# Patient Record
Sex: Female | Born: 1950 | Race: White | Hispanic: No | Marital: Married | Smoking: Former smoker
Health system: Southern US, Community
[De-identification: ages and names within clinical notes are randomized; demographics above are authoritative.]

## PROBLEM LIST (undated history)

## (undated) DIAGNOSIS — IMO0002 Reserved for concepts with insufficient information to code with codable children: Secondary | ICD-10-CM

## (undated) DIAGNOSIS — F32A Depression, unspecified: Secondary | ICD-10-CM

## (undated) DIAGNOSIS — Z8489 Family history of other specified conditions: Secondary | ICD-10-CM

## (undated) DIAGNOSIS — E7800 Pure hypercholesterolemia, unspecified: Secondary | ICD-10-CM

## (undated) DIAGNOSIS — K219 Gastro-esophageal reflux disease without esophagitis: Secondary | ICD-10-CM

## (undated) DIAGNOSIS — M1990 Unspecified osteoarthritis, unspecified site: Secondary | ICD-10-CM

## (undated) DIAGNOSIS — F329 Major depressive disorder, single episode, unspecified: Secondary | ICD-10-CM

## (undated) DIAGNOSIS — R112 Nausea with vomiting, unspecified: Secondary | ICD-10-CM

## (undated) DIAGNOSIS — M810 Age-related osteoporosis without current pathological fracture: Secondary | ICD-10-CM

## (undated) DIAGNOSIS — Z9889 Other specified postprocedural states: Secondary | ICD-10-CM

## (undated) DIAGNOSIS — L570 Actinic keratosis: Secondary | ICD-10-CM

## (undated) DIAGNOSIS — I10 Essential (primary) hypertension: Secondary | ICD-10-CM

## (undated) HISTORY — PX: ABDOMINAL HYSTERECTOMY: SHX81

## (undated) HISTORY — DX: Actinic keratosis: L57.0

## (undated) HISTORY — PX: TONSILLECTOMY: SUR1361

## (undated) HISTORY — PX: CHOLECYSTECTOMY: SHX55

---

## 2005-03-27 ENCOUNTER — Ambulatory Visit: Payer: Self-pay | Admitting: Unknown Physician Specialty

## 2005-09-18 ENCOUNTER — Encounter: Payer: Self-pay | Admitting: Neurosurgery

## 2005-10-06 ENCOUNTER — Encounter: Payer: Self-pay | Admitting: Neurosurgery

## 2005-11-12 ENCOUNTER — Other Ambulatory Visit: Payer: Self-pay

## 2005-11-12 ENCOUNTER — Emergency Department: Payer: Self-pay | Admitting: Emergency Medicine

## 2006-10-06 HISTORY — PX: NECK SURGERY: SHX720

## 2006-12-28 ENCOUNTER — Ambulatory Visit: Payer: Self-pay | Admitting: Unknown Physician Specialty

## 2007-01-04 ENCOUNTER — Inpatient Hospital Stay (HOSPITAL_COMMUNITY): Admission: RE | Admit: 2007-01-04 | Discharge: 2007-01-06 | Payer: Self-pay | Admitting: Neurosurgery

## 2007-03-17 ENCOUNTER — Encounter: Payer: Self-pay | Admitting: Neurosurgery

## 2007-04-06 ENCOUNTER — Encounter: Payer: Self-pay | Admitting: Neurosurgery

## 2007-05-07 ENCOUNTER — Encounter: Payer: Self-pay | Admitting: Neurosurgery

## 2007-06-07 ENCOUNTER — Encounter: Payer: Self-pay | Admitting: Neurosurgery

## 2007-09-27 ENCOUNTER — Ambulatory Visit: Payer: Self-pay | Admitting: Otolaryngology

## 2007-12-07 DIAGNOSIS — D239 Other benign neoplasm of skin, unspecified: Secondary | ICD-10-CM

## 2007-12-07 HISTORY — DX: Other benign neoplasm of skin, unspecified: D23.9

## 2008-03-10 ENCOUNTER — Ambulatory Visit: Payer: Self-pay | Admitting: Internal Medicine

## 2008-07-21 ENCOUNTER — Ambulatory Visit: Payer: Self-pay | Admitting: Internal Medicine

## 2008-12-04 ENCOUNTER — Ambulatory Visit: Payer: Self-pay | Admitting: Internal Medicine

## 2009-01-04 ENCOUNTER — Ambulatory Visit: Payer: Self-pay | Admitting: Internal Medicine

## 2009-01-17 ENCOUNTER — Ambulatory Visit: Payer: Self-pay | Admitting: Internal Medicine

## 2009-02-03 ENCOUNTER — Ambulatory Visit: Payer: Self-pay | Admitting: Internal Medicine

## 2009-05-14 ENCOUNTER — Ambulatory Visit: Payer: Self-pay | Admitting: Unknown Physician Specialty

## 2009-05-14 ENCOUNTER — Ambulatory Visit: Payer: Self-pay | Admitting: Cardiology

## 2009-05-17 ENCOUNTER — Ambulatory Visit: Payer: Self-pay | Admitting: Unknown Physician Specialty

## 2012-08-05 ENCOUNTER — Ambulatory Visit: Payer: Self-pay | Admitting: Family Medicine

## 2012-10-06 HISTORY — PX: WRIST SURGERY: SHX841

## 2013-11-09 ENCOUNTER — Ambulatory Visit: Payer: Self-pay | Admitting: Family Medicine

## 2014-08-03 DIAGNOSIS — M961 Postlaminectomy syndrome, not elsewhere classified: Secondary | ICD-10-CM | POA: Insufficient documentation

## 2014-08-17 ENCOUNTER — Ambulatory Visit: Payer: Self-pay | Admitting: Medical

## 2014-12-06 ENCOUNTER — Ambulatory Visit: Payer: Self-pay | Admitting: Family Medicine

## 2016-01-07 ENCOUNTER — Other Ambulatory Visit: Payer: Self-pay | Admitting: Family Medicine

## 2016-01-07 DIAGNOSIS — Z7185 Encounter for immunization safety counseling: Secondary | ICD-10-CM | POA: Insufficient documentation

## 2016-01-07 DIAGNOSIS — Z1231 Encounter for screening mammogram for malignant neoplasm of breast: Secondary | ICD-10-CM

## 2016-01-07 DIAGNOSIS — Z7189 Other specified counseling: Secondary | ICD-10-CM | POA: Insufficient documentation

## 2016-01-17 ENCOUNTER — Ambulatory Visit
Admission: RE | Admit: 2016-01-17 | Discharge: 2016-01-17 | Disposition: A | Discharge status: Home / Self Care | Payer: BLUE CROSS/BLUE SHIELD | Source: Ambulatory Visit | Attending: Family Medicine | Admitting: Family Medicine

## 2016-01-17 DIAGNOSIS — Z1231 Encounter for screening mammogram for malignant neoplasm of breast: Secondary | ICD-10-CM | POA: Diagnosis present

## 2016-11-05 ENCOUNTER — Emergency Department
Admission: EM | Admit: 2016-11-05 | Discharge: 2016-11-05 | Disposition: A | Discharge status: Home / Self Care | Payer: BLUE CROSS/BLUE SHIELD | Attending: Emergency Medicine | Admitting: Emergency Medicine

## 2016-11-05 ENCOUNTER — Emergency Department: Payer: BLUE CROSS/BLUE SHIELD

## 2016-11-05 ENCOUNTER — Encounter: Payer: Self-pay | Admitting: *Deleted

## 2016-11-05 DIAGNOSIS — I10 Essential (primary) hypertension: Secondary | ICD-10-CM | POA: Insufficient documentation

## 2016-11-05 DIAGNOSIS — J189 Pneumonia, unspecified organism: Secondary | ICD-10-CM | POA: Insufficient documentation

## 2016-11-05 DIAGNOSIS — R05 Cough: Secondary | ICD-10-CM | POA: Diagnosis present

## 2016-11-05 HISTORY — DX: Essential (primary) hypertension: I10

## 2016-11-05 MED ORDER — MOXIFLOXACIN HCL 400 MG PO TABS: 400.0000 mg | ORAL_TABLET | Freq: Every day | ORAL | 0 refills | Status: AC

## 2016-11-05 MED ORDER — HYDROCOD POLST-CPM POLST ER 10-8 MG/5ML PO SUER: 5.0000 mL | Freq: Two times a day (BID) | ORAL | 0 refills | Status: DC | PRN

## 2016-11-05 MED ORDER — PREDNISONE 10 MG PO TABS: 50.0000 mg | ORAL_TABLET | Freq: Every day | ORAL | 0 refills | Status: DC

## 2016-11-05 NOTE — Discharge Instructions (Signed)
Please follow up with Primary Care. You should have a repeat chest x-ray in about a month to make sure the infection has totally cleared.  Return to the ER for symptoms that change or worsen or for new concerns.

## 2016-11-05 NOTE — ED Provider Notes (Signed)
[facility] Emergency Department Provider Note  ____________________________________________  Time seen: Approximately 8:36 AM I have reviewed the triage vital signs and the nursing notes.   HISTORY  Chief Complaint Cough   HPI Jane Riley is a 66 y.o. female who presents to the emergency department for evaluation of cough.Cough started about 6 weeks ago. She took antibiotics at that time and it seemed to go away. Her husband then got the flu and she began to feel bad and her cough returned. She had a 10 day course of Levaquin in December. No antibiotic since that time. She now has pain in her left side due to the cough.   Past Medical History:  Diagnosis Date  . Hypertension     There are no active problems to display for this patient.   History reviewed. No pertinent surgical history.  Prior to Admission medications   Medication Sig Start Date End Date Taking? Authorizing Provider  chlorpheniramine-HYDROcodone (TUSSIONEX PENNKINETIC ER) 10-8 MG/5ML SUER Take 5 mLs by mouth every 12 (twelve) hours as needed for cough. 11/05/16   Victorino Dike, FNP  moxifloxacin (AVELOX) 400 MG tablet Take 1 tablet (400 mg total) by mouth daily. 11/05/16 11/15/16  Victorino Dike, FNP  predniSONE (DELTASONE) 10 MG tablet Take 5 tablets (50 mg total) by mouth daily. 11/05/16   Victorino Dike, FNP    Allergies Codeine and Penicillins  Family History  Problem Relation Age of Onset  . Breast cancer Cousin     Social History Social History  Substance Use Topics  . Smoking status: Never Smoker  . Smokeless tobacco: Not on file  . Alcohol use Not on file    Review of Systems Constitutional: Questionable fever 2 nights ago ENT: No sore throat. Cardiovascular: Denies chest pain. Respiratory: No shortness of breath. Positive for cough. Gastrointestinal: Negative for nausea,  no vomiting.  No diarrhea.  Musculoskeletal: Negative for body aches Skin: Negative for  rash. Neurological: Negative for headaches ____________________________________________   PHYSICAL EXAM:  VITAL SIGNS: ED Triage Vitals [11/05/16 0831]  Enc Vitals Group     BP (!) 167/77     Pulse Rate (!) 102     Resp 18     Temp 98.3 F (36.8 C)     Temp Source Oral     SpO2 96 %     Weight 150 lb (68 kg)     Height 5\' 4"  (1.626 m)     Head Circumference      Peak Flow      Pain Score 10     Pain Loc      Pain Edu?      Excl. in [place]?     Constitutional: Alert and oriented. Well appearing and in no acute distress. Eyes: Conjunctivae are normal. EOMI. Mouth/Throat: Mucous membranes are moist. Neck: No stridor.  Lymphatic: No cervical lymphadenopathy. Cardiovascular: Normal rate, regular rhythm. Grossly normal heart sounds.  Good peripheral circulation. Respiratory: Normal respiratory effort.  No retractions. Clear to auscultation throughout. Gastrointestinal: Soft and nontender.  Musculoskeletal: FROM x 4 extremities.  Neurologic:  Normal speech and language.  Skin:  Skin is warm, dry and intact. No rash noted. Psychiatric: Mood and affect are normal. Speech and behavior are normal.  ____________________________________________   LABS (all labs ordered are listed, but only abnormal results are displayed)  Labs Reviewed - No data to display ____________________________________________  EKG  Not indicated. ____________________________________________  RADIOLOGY Chest 2 view:  IMPRESSION:  Mild  infiltrate is noted anteriorly projected over the mid lungs,  seen on lateral view only. Mild pneumonia cannot be excluded .  Follow-up PA lateral chest x-ray suggested to demonstrate clearing.      ____________________________________________   PROCEDURES  Procedure(s) performed: None  Critical Care performed: No  ____________________________________________   INITIAL IMPRESSION / ASSESSMENT AND PLAN / ED COURSE     Pertinent labs & imaging results  that were available during my care of the patient were reviewed by me and considered in my medical decision making (see chart for details).  66 year old female presenting to the emergency department for evaluation of cough. Today, she will be treated with Avelox, prednisone, and just next. She was instructed to follow-up with primary care provider for any symptom that is not improving over the week. After further review of her chart, she has a follow-up appointment on 11/13/2016 with pulmonology. She was instructed to keep the appointment as scheduled. She was advised to return to the emergency department for symptoms that change or worsen if she is unable schedule an earlier appointment. ____________________________________________   FINAL CLINICAL IMPRESSION(S) / ED DIAGNOSES  Final diagnoses:  Community acquired pneumonia, unspecified laterality    Note:  This document was prepared using Systems analyst and may include unintentional dictation errors.     Victorino Dike, FNP 11/05/16 [place], MD 11/05/16 1540

## 2016-11-05 NOTE — ED Triage Notes (Signed)
States cough for 6 months, states the cough has worsened over the past 2 weeks, states she was called in tamiflu but with no relief, states she has been coughing so much now every time she coughs she has pain in her side, awake and alert in no acute distress

## 2016-11-13 DIAGNOSIS — R0602 Shortness of breath: Secondary | ICD-10-CM | POA: Diagnosis not present

## 2016-11-17 DIAGNOSIS — M5412 Radiculopathy, cervical region: Secondary | ICD-10-CM | POA: Diagnosis not present

## 2016-11-17 DIAGNOSIS — I10 Essential (primary) hypertension: Secondary | ICD-10-CM | POA: Diagnosis not present

## 2016-11-17 DIAGNOSIS — Z6829 Body mass index (BMI) 29.0-29.9, adult: Secondary | ICD-10-CM | POA: Diagnosis not present

## 2016-11-17 DIAGNOSIS — M961 Postlaminectomy syndrome, not elsewhere classified: Secondary | ICD-10-CM | POA: Diagnosis not present

## 2016-12-09 DIAGNOSIS — H2513 Age-related nuclear cataract, bilateral: Secondary | ICD-10-CM | POA: Diagnosis not present

## 2016-12-15 ENCOUNTER — Other Ambulatory Visit: Payer: Self-pay | Admitting: Medical

## 2016-12-15 DIAGNOSIS — Z760 Encounter for issue of repeat prescription: Secondary | ICD-10-CM

## 2016-12-15 DIAGNOSIS — J3089 Other allergic rhinitis: Secondary | ICD-10-CM

## 2016-12-15 DIAGNOSIS — J309 Allergic rhinitis, unspecified: Secondary | ICD-10-CM | POA: Insufficient documentation

## 2016-12-15 MED ORDER — FLUTICASONE PROPIONATE 50 MCG/ACT NA SUSP: 2.0000 | Freq: Every day | NASAL | 11 refills | Status: DC

## 2016-12-15 NOTE — Progress Notes (Signed)
Refill request by [facility] on [address]. Flonase ns 16 g  2 sprays each nare daily refill #11 Return to the clinic prn.

## 2016-12-18 DIAGNOSIS — L820 Inflamed seborrheic keratosis: Secondary | ICD-10-CM | POA: Diagnosis not present

## 2016-12-18 DIAGNOSIS — D229 Melanocytic nevi, unspecified: Secondary | ICD-10-CM | POA: Diagnosis not present

## 2016-12-18 DIAGNOSIS — L739 Follicular disorder, unspecified: Secondary | ICD-10-CM | POA: Diagnosis not present

## 2016-12-18 DIAGNOSIS — D485 Neoplasm of uncertain behavior of skin: Secondary | ICD-10-CM | POA: Diagnosis not present

## 2016-12-18 DIAGNOSIS — Z1283 Encounter for screening for malignant neoplasm of skin: Secondary | ICD-10-CM | POA: Diagnosis not present

## 2016-12-18 DIAGNOSIS — L814 Other melanin hyperpigmentation: Secondary | ICD-10-CM | POA: Diagnosis not present

## 2016-12-18 DIAGNOSIS — L821 Other seborrheic keratosis: Secondary | ICD-10-CM | POA: Diagnosis not present

## 2016-12-26 DIAGNOSIS — H2513 Age-related nuclear cataract, bilateral: Secondary | ICD-10-CM | POA: Diagnosis not present

## 2016-12-30 DIAGNOSIS — E782 Mixed hyperlipidemia: Secondary | ICD-10-CM | POA: Diagnosis not present

## 2016-12-30 DIAGNOSIS — I10 Essential (primary) hypertension: Secondary | ICD-10-CM | POA: Diagnosis not present

## 2017-01-05 ENCOUNTER — Encounter: Payer: Self-pay | Admitting: *Deleted

## 2017-01-06 DIAGNOSIS — I10 Essential (primary) hypertension: Secondary | ICD-10-CM | POA: Diagnosis not present

## 2017-01-06 DIAGNOSIS — Z0000 Encounter for general adult medical examination without abnormal findings: Secondary | ICD-10-CM | POA: Diagnosis not present

## 2017-01-06 DIAGNOSIS — E782 Mixed hyperlipidemia: Secondary | ICD-10-CM | POA: Diagnosis not present

## 2017-01-07 ENCOUNTER — Other Ambulatory Visit: Payer: Self-pay | Admitting: Family Medicine

## 2017-01-07 DIAGNOSIS — Z1231 Encounter for screening mammogram for malignant neoplasm of breast: Secondary | ICD-10-CM

## 2017-01-09 NOTE — Discharge Instructions (Signed)
Cataract Surgery, Care After °Refer to this sheet in the next few weeks. These instructions provide you with information about caring for yourself after your procedure. Your health care provider may also give you more specific instructions. Your treatment has been planned according to current medical practices, but problems sometimes occur. Call your health care provider if you have any problems or questions after your procedure. °What can I expect after the procedure? °After the procedure, it is common to have: °· Itching. °· Discomfort. °· Fluid discharge. °· Sensitivity to light and to touch. °· Bruising. °Follow these instructions at home: °Eye Care  °· Check your eye every day for signs of infection. Watch for: °¨ Redness, swelling, or pain. °¨ Fluid, blood, or pus. °¨ Warmth. °¨ Bad smell. °Activity  °· Avoid strenuous activities, such as playing contact sports, for as long as told by your health care provider. °· Do not drive or operate heavy machinery until your health care provider approves. °· Do not bend or lift heavy objects . Bending increases pressure in the eye. You can walk, climb stairs, and do light household chores. °· Ask your health care provider when you can return to work. If you work in a dusty environment, you may be advised to wear protective eyewear for a period of time. °General instructions  °· Take or apply over-the-counter and prescription medicines only as told by your health care provider. This includes eye drops. °· Do not touch or rub your eyes. °· If you were given a protective shield, wear it as told by your health care provider. If you were not given a protective shield, wear sunglasses as told by your health care provider to protect your eyes. °· Keep the area around your eye clean and dry. Avoid swimming or allowing water to hit you directly in the face while showering until told by your health care provider. Keep soap and shampoo out of your eyes. °· Do not put a contact lens  into the affected eye or eyes until your health care provider approves. °· Keep all follow-up visits as told by your health care provider. This is important. °Contact a health care provider if: ° °· You have increased bruising around your eye. °· You have pain that is not helped with medicine. °· You have a fever. °· You have redness, swelling, or pain in your eye. °· You have fluid, blood, or pus coming from your incision. °· Your vision gets worse. °Get help right away if: °· You have sudden vision loss. °This information is not intended to replace advice given to you by your health care provider. Make sure you discuss any questions you have with your health care provider. °Document Released: 04/11/2005 Document Revised: 01/31/2016 Document Reviewed: 08/02/2015 °Elsevier Interactive Patient Education © 2017 [employer] ° ° ° ° °General Anesthesia, Adult, Care After °These instructions provide you with information about caring for yourself after your procedure. Your health care provider may also give you more specific instructions. Your treatment has been planned according to current medical practices, but problems sometimes occur. Call your health care provider if you have any problems or questions after your procedure. °What can I expect after the procedure? °After the procedure, it is common to have: °· Vomiting. °· A sore throat. °· Mental slowness. °It is common to feel: °· Nauseous. °· Cold or shivery. °· Sleepy. °· Tired. °· Sore or achy, even in parts of your body where you did not have surgery. °Follow these instructions at   home: °For at least 24 hours after the procedure:  °· Do not: °¨ Participate in activities where you could fall or become injured. °¨ Drive. °¨ Use heavy machinery. °¨ Drink alcohol. °¨ Take sleeping pills or medicines that cause drowsiness. °¨ Make important decisions or sign legal documents. °¨ Take care of children on your own. °· Rest. °Eating and drinking  °· If you vomit, drink  water, juice, or soup when you can drink without vomiting. °· Drink enough fluid to keep your urine clear or pale yellow. °· Make sure you have little or no nausea before eating solid foods. °· Follow the diet recommended by your health care provider. °General instructions  °· Have a responsible adult stay with you until you are awake and alert. °· Return to your normal activities as told by your health care provider. Ask your health care provider what activities are safe for you. °· Take over-the-counter and prescription medicines only as told by your health care provider. °· If you smoke, do not smoke without supervision. °· Keep all follow-up visits as told by your health care provider. This is important. °Contact a health care provider if: °· You continue to have nausea or vomiting at home, and medicines are not helpful. °· You cannot drink fluids or start eating again. °· You cannot urinate after 8-12 hours. °· You develop a skin rash. °· You have fever. °· You have increasing redness at the site of your procedure. °Get help right away if: °· You have difficulty breathing. °· You have chest pain. °· You have unexpected bleeding. °· You feel that you are having a life-threatening or urgent problem. °This information is not intended to replace advice given to you by your health care provider. Make sure you discuss any questions you have with your health care provider. °Document Released: 12/29/2000 Document Revised: 02/25/2016 Document Reviewed: 09/06/2015 °Elsevier Interactive Patient Education © 2017 [employer] ° °

## 2017-01-12 ENCOUNTER — Encounter
Admission: RE | Disposition: A | Discharge status: Home / Self Care | Payer: Self-pay | Source: Ambulatory Visit | Attending: Ophthalmology

## 2017-01-12 ENCOUNTER — Ambulatory Visit: Payer: BLUE CROSS/BLUE SHIELD | Admitting: Anesthesiology

## 2017-01-12 ENCOUNTER — Ambulatory Visit
Admission: RE | Admit: 2017-01-12 | Discharge: 2017-01-12 | Disposition: A | Discharge status: Home / Self Care | Payer: BLUE CROSS/BLUE SHIELD | Source: Ambulatory Visit | Attending: Ophthalmology | Admitting: Ophthalmology

## 2017-01-12 DIAGNOSIS — I10 Essential (primary) hypertension: Secondary | ICD-10-CM | POA: Diagnosis not present

## 2017-01-12 DIAGNOSIS — K219 Gastro-esophageal reflux disease without esophagitis: Secondary | ICD-10-CM | POA: Diagnosis not present

## 2017-01-12 DIAGNOSIS — G709 Myoneural disorder, unspecified: Secondary | ICD-10-CM | POA: Insufficient documentation

## 2017-01-12 DIAGNOSIS — H2513 Age-related nuclear cataract, bilateral: Secondary | ICD-10-CM | POA: Diagnosis not present

## 2017-01-12 DIAGNOSIS — Z87891 Personal history of nicotine dependence: Secondary | ICD-10-CM | POA: Insufficient documentation

## 2017-01-12 DIAGNOSIS — H2511 Age-related nuclear cataract, right eye: Secondary | ICD-10-CM | POA: Insufficient documentation

## 2017-01-12 HISTORY — DX: Pure hypercholesterolemia, unspecified: E78.00

## 2017-01-12 HISTORY — DX: Depression, unspecified: F32.A

## 2017-01-12 HISTORY — DX: Reserved for concepts with insufficient information to code with codable children: IMO0002

## 2017-01-12 HISTORY — DX: Age-related osteoporosis without current pathological fracture: M81.0

## 2017-01-12 HISTORY — DX: Major depressive disorder, single episode, unspecified: F32.9

## 2017-01-12 HISTORY — DX: Gastro-esophageal reflux disease without esophagitis: K21.9

## 2017-01-12 HISTORY — DX: Nausea with vomiting, unspecified: R11.2

## 2017-01-12 HISTORY — DX: Unspecified osteoarthritis, unspecified site: M19.90

## 2017-01-12 HISTORY — DX: Other specified postprocedural states: Z98.890

## 2017-01-12 HISTORY — PX: CATARACT EXTRACTION W/PHACO: SHX586

## 2017-01-12 SURGERY — PHACOEMULSIFICATION, CATARACT, WITH IOL INSERTION
Anesthesia: Monitor Anesthesia Care | Site: Eye | Laterality: Right | Wound class: Clean

## 2017-01-12 MED ORDER — NA HYALUR & NA CHOND-NA HYALUR 0.4-0.35 ML IO KIT
PACK | INTRAOCULAR | Status: DC | PRN
  Administered 2017-01-12: 1 mL via INTRAOCULAR

## 2017-01-12 MED ORDER — MOXIFLOXACIN HCL 0.5 % OP SOLN
1.0000 [drp] | OPHTHALMIC | Status: DC | PRN
  Administered 2017-01-12 (×3): 1 [drp] via OPHTHALMIC

## 2017-01-12 MED ORDER — ACETAMINOPHEN 160 MG/5ML PO SOLN: 325.0000 mg | ORAL | Status: DC | PRN

## 2017-01-12 MED ORDER — ACETAMINOPHEN 325 MG PO TABS: 325.0000 mg | ORAL_TABLET | ORAL | Status: DC | PRN

## 2017-01-12 MED ORDER — BRIMONIDINE TARTRATE-TIMOLOL 0.2-0.5 % OP SOLN
OPHTHALMIC | Status: DC | PRN
  Administered 2017-01-12: 1 [drp] via OPHTHALMIC

## 2017-01-12 MED ORDER — FENTANYL CITRATE (PF) 100 MCG/2ML IJ SOLN
INTRAMUSCULAR | Status: DC | PRN
  Administered 2017-01-12: 50 ug via INTRAVENOUS

## 2017-01-12 MED ORDER — LIDOCAINE HCL (PF) 2 % IJ SOLN
INTRAMUSCULAR | Status: DC | PRN
  Administered 2017-01-12: 1 mL

## 2017-01-12 MED ORDER — LACTATED RINGERS IV SOLN: INTRAVENOUS | Status: DC

## 2017-01-12 MED ORDER — ARMC OPHTHALMIC DILATING DROPS
1.0000 "application " | OPHTHALMIC | Status: DC | PRN
  Administered 2017-01-12 (×3): 1 via OPHTHALMIC

## 2017-01-12 MED ORDER — BSS IO SOLN
INTRAOCULAR | Status: DC | PRN
  Administered 2017-01-12: .1 mL via OPHTHALMIC

## 2017-01-12 MED ORDER — EPINEPHRINE PF 1 MG/ML IJ SOLN
INTRAOCULAR | Status: DC | PRN
  Administered 2017-01-12: 94 mL via OPHTHALMIC

## 2017-01-12 MED ORDER — MIDAZOLAM HCL 2 MG/2ML IJ SOLN
INTRAMUSCULAR | Status: DC | PRN
  Administered 2017-01-12: 1 mg via INTRAVENOUS

## 2017-01-12 SURGICAL SUPPLY — 24 items
APL FBRTP 3 NS LF CTTN WD (MISCELLANEOUS) ×1
APPLICATOR COTTON TIP 3IN (MISCELLANEOUS) ×3 IMPLANT
AcrySof IQ Multifocal lens 24.5D (Intraocular Lens) ×2 IMPLANT
CANNULA ANT/CHMB 27G (MISCELLANEOUS) ×1 IMPLANT
CANNULA ANT/CHMB 27GA (MISCELLANEOUS) ×3 IMPLANT
DISSECTOR HYDRO NUCLEUS 50X22 (MISCELLANEOUS) ×3 IMPLANT
GLOVE BIO SURGEON STRL SZ7 (GLOVE) ×3 IMPLANT
GLOVE SURG LX 6.5 MICRO (GLOVE) ×2
GLOVE SURG LX STRL 6.5 MICRO (GLOVE) ×1 IMPLANT
GOWN STRL REUS W/ TWL LRG LVL3 (GOWN DISPOSABLE) ×2 IMPLANT
GOWN STRL REUS W/TWL LRG LVL3 (GOWN DISPOSABLE) ×6
MARKER SKIN DUAL TIP RULER LAB (MISCELLANEOUS) ×3 IMPLANT
PACK CATARACT BRASINGTON (MISCELLANEOUS) ×3 IMPLANT
PACK EYE AFTER SURG (MISCELLANEOUS) ×3 IMPLANT
PACK OPTHALMIC (MISCELLANEOUS) ×3 IMPLANT
RING MALYGIN 7.0 (MISCELLANEOUS) IMPLANT
SUT ETHILON 10-0 CS-B-6CS-B-6 (SUTURE)
SUT VICRYL  9 0 (SUTURE)
SUT VICRYL 9 0 (SUTURE) IMPLANT
SUTURE EHLN 10-0 CS-B-6CS-B-6 (SUTURE) IMPLANT
SYR TB 1ML LUER SLIP (SYRINGE) ×3 IMPLANT
WATER STERILE IRR 250ML POUR (IV SOLUTION) ×3 IMPLANT
WICK EYE OCUCEL (MISCELLANEOUS) IMPLANT
WIPE NON LINTING 3.25X3.25 (MISCELLANEOUS) ×3 IMPLANT

## 2017-01-12 NOTE — Transfer of Care (Signed)
Immediate Anesthesia Transfer of Care Note  Patient: Jane Riley  Procedure(s) Performed: Procedure(s) with comments: CATARACT EXTRACTION PHACO AND INTRAOCULAR LENS PLACEMENT (IOC) Right Restor Lens (Right) - Restor Lens  Patient Location: PACU  Anesthesia Type: MAC  Level of Consciousness: awake, alert  and patient cooperative  Airway and Oxygen Therapy: Patient Spontanous Breathing and Patient connected to supplemental oxygen  Post-op Assessment: Post-op Vital signs reviewed, Patient's Cardiovascular Status Stable, Respiratory Function Stable, Patent Airway and No signs of Nausea or vomiting  Post-op Vital Signs: Reviewed and stable  Complications: No apparent anesthesia complications

## 2017-01-12 NOTE — H&P (Signed)
H+P reviewed and is up to date, please see paper chart.  

## 2017-01-12 NOTE — Anesthesia Preprocedure Evaluation (Signed)
Anesthesia Evaluation  Patient identified by MRN, date of birth, ID band Patient awake    Reviewed: Allergy & Precautions, H&P , NPO status , Patient's Chart, lab work & pertinent test results  History of Anesthesia Complications (+) PONV and history of anesthetic complications  Airway Mallampati: II  TM Distance: >3 FB Neck ROM: full    Dental no notable dental hx.    Pulmonary former smoker,    Pulmonary exam normal        Cardiovascular hypertension, Normal cardiovascular exam     Neuro/Psych  Neuromuscular disease    GI/Hepatic GERD  ,  Endo/Other    Renal/GU      Musculoskeletal   Abdominal   Peds  Hematology   Anesthesia Other Findings   Reproductive/Obstetrics                             Anesthesia Physical Anesthesia Plan  ASA: II  Anesthesia Plan: MAC   Post-op Pain Management:    Induction:   Airway Management Planned:   Additional Equipment:   Intra-op Plan:   Post-operative Plan:   Informed Consent: I have reviewed the patients History and Physical, chart, labs and discussed the procedure including the risks, benefits and alternatives for the proposed anesthesia with the patient or authorized representative who has indicated his/her understanding and acceptance.     Plan Discussed with:   Anesthesia Plan Comments:         Anesthesia Quick Evaluation

## 2017-01-12 NOTE — Op Note (Signed)
Date of Surgery: 01/12/2017  PREOPERATIVE DIAGNOSES: Visually significant nuclear sclerotic cataract, right eye.  POSTOPERATIVE DIAGNOSES: Same  PROCEDURES PERFORMED: Cataract extraction with multifocal intraocular lens implant, right eye.  SURGEON: Almon Hercules, M.D.  ANESTHESIA: MAC and topical  IMPLANTS: [id number] ReSTOR +24.5 D  Implant Name Type Inv. Item Serial No. Manufacturer Lot No. LRB No. Used  AcrySof IQ Multifocal lens 24.5D Intraocular Lens   [id number] [employer]CON   Right 1     COMPLICATIONS: None.  DESCRIPTION OF PROCEDURE: Therapeutic options were discussed with the patient preoperatively, including a discussion of risks and benefits of surgery. Informed consent was obtained. An IOL-Master and immersion biometry were used to take the lens measurements, and a dilated fundus exam was performed within 6 months of the surgical date.  The patient was premedicated and brought to the operating room and placed on the operating table in the supine position. After adequate anesthesia, the patient was prepped and draped in the usual sterile ophthalmic fashion. A wire lid speculum was inserted and the microscope was positioned. A Superblade was used to create a paracentesis site at the limbus and a small amount of dilute preservative free lidocaine was instilled into the anterior chamber, followed by dispersive viscoelastic. A clear corneal incision was created temporally using a 2.4 mm keratome blade. Capsulorrhexis was then performed. In situ phacoemulsification was performed.  Cortical material was removed with the irrigation-aspiration unit. Dispersive viscoelastic was instilled to open the capsular bag. A posterior chamber intraocular lens with the specifications above was inserted and positioned. Irrigation-aspiration was used to remove all viscoelastic. Vigamox 1cc was instilled into the anterior chamber, and the corneal incision was checked and found to be water tight. The eyelid  speculum was removed.  The operative eye was covered with protective goggles after instilling 1 drop of timolol and brimonidine. The patient tolerated the procedure well. There were no complications.

## 2017-01-12 NOTE — Anesthesia Procedure Notes (Signed)
Performed by: Lind Guest

## 2017-01-12 NOTE — Anesthesia Postprocedure Evaluation (Signed)
Anesthesia Post Note  Patient: Jane Riley  Procedure(s) Performed: Procedure(s) (LRB): CATARACT EXTRACTION PHACO AND INTRAOCULAR LENS PLACEMENT (IOC) Right Restor Lens (Right)  Patient location during evaluation: PACU Anesthesia Type: MAC Level of consciousness: awake and alert and oriented Pain management: satisfactory to patient Vital Signs Assessment: post-procedure vital signs reviewed and stable Respiratory status: spontaneous breathing, nonlabored ventilation and respiratory function stable Cardiovascular status: blood pressure returned to baseline and stable Postop Assessment: Adequate PO intake and No signs of nausea or vomiting Anesthetic complications: no    Raliegh Ip

## 2017-01-12 NOTE — Anesthesia Procedure Notes (Signed)
Procedure Name: MAC Performed by: Lind Guest Pre-anesthesia Checklist: Patient identified, Emergency Drugs available, Suction available, Patient being monitored and Timeout performed Oxygen Delivery Method: Nasal cannula

## 2017-01-13 ENCOUNTER — Encounter: Payer: Self-pay | Admitting: Ophthalmology

## 2017-01-29 ENCOUNTER — Ambulatory Visit
Admission: RE | Admit: 2017-01-29 | Discharge: 2017-01-29 | Disposition: A | Discharge status: Home / Self Care | Payer: BLUE CROSS/BLUE SHIELD | Source: Ambulatory Visit | Attending: Family Medicine | Admitting: Family Medicine

## 2017-01-29 DIAGNOSIS — Z1231 Encounter for screening mammogram for malignant neoplasm of breast: Secondary | ICD-10-CM | POA: Diagnosis not present

## 2017-02-24 DIAGNOSIS — M5412 Radiculopathy, cervical region: Secondary | ICD-10-CM | POA: Diagnosis not present

## 2017-04-02 DIAGNOSIS — M961 Postlaminectomy syndrome, not elsewhere classified: Secondary | ICD-10-CM | POA: Diagnosis not present

## 2017-04-02 DIAGNOSIS — I10 Essential (primary) hypertension: Secondary | ICD-10-CM | POA: Diagnosis not present

## 2017-04-02 DIAGNOSIS — Z6825 Body mass index (BMI) 25.0-25.9, adult: Secondary | ICD-10-CM | POA: Diagnosis not present

## 2017-05-06 LAB — HM COLONOSCOPY

## 2017-05-20 ENCOUNTER — Encounter: Payer: Self-pay | Admitting: Medical

## 2017-05-20 ENCOUNTER — Ambulatory Visit: Payer: Self-pay | Admitting: Registered Nurse

## 2017-05-20 VITALS — BP 122/82 | HR 67 | Temp 98.2°F | Wt 150.0 lb

## 2017-05-20 DIAGNOSIS — J301 Allergic rhinitis due to pollen: Secondary | ICD-10-CM

## 2017-05-20 DIAGNOSIS — E782 Mixed hyperlipidemia: Secondary | ICD-10-CM | POA: Insufficient documentation

## 2017-05-20 DIAGNOSIS — I10 Essential (primary) hypertension: Secondary | ICD-10-CM | POA: Insufficient documentation

## 2017-05-20 DIAGNOSIS — H6593 Unspecified nonsuppurative otitis media, bilateral: Secondary | ICD-10-CM

## 2017-05-20 DIAGNOSIS — K219 Gastro-esophageal reflux disease without esophagitis: Secondary | ICD-10-CM | POA: Insufficient documentation

## 2017-05-20 DIAGNOSIS — J0101 Acute recurrent maxillary sinusitis: Secondary | ICD-10-CM

## 2017-05-20 DIAGNOSIS — F419 Anxiety disorder, unspecified: Secondary | ICD-10-CM | POA: Insufficient documentation

## 2017-05-20 MED ORDER — DOXYCYCLINE HYCLATE 100 MG PO TABS: 100.0000 mg | ORAL_TABLET | Freq: Two times a day (BID) | ORAL | 0 refills | Status: DC

## 2017-05-20 MED ORDER — MONTELUKAST SODIUM 10 MG PO TABS: 10.0000 mg | ORAL_TABLET | Freq: Every day | ORAL | 3 refills | Status: DC

## 2017-05-20 MED ORDER — FLUTICASONE PROPIONATE 50 MCG/ACT NA SUSP: 1.0000 | Freq: Two times a day (BID) | NASAL | 11 refills | Status: DC

## 2017-05-20 MED ORDER — SALINE SPRAY 0.65 % NA SOLN: 2.0000 | NASAL | 0 refills | Status: DC

## 2017-05-20 NOTE — Patient Instructions (Signed)
Doxycycline 100mg  by mouth twice a day for 10 days Take with food Take doxycycline 1 hour prior or 4 hours after magnesium Sunscreen/protective clothing while on doxycycline and take with food Splint flonase 1 spray each nostril twice a day Continue nasal saline at least twice a day Shower prior to bedtime Start singulair 10mg  by mouth at bedtime  Sinus Rinse What is a sinus rinse? A sinus rinse is a simple home treatment that is used to rinse your sinuses with a sterile mixture of salt and water (saline solution). Sinuses are air-filled spaces in your skull behind the bones of your face and forehead that open into your nasal cavity. You will use the following:  Saline solution.  Neti pot or spray bottle. This releases the saline solution into your nose and through your sinuses. Neti pots and spray bottles can be purchased at [employer], a health food store, or online.  When would I do a sinus rinse? A sinus rinse can help to clear mucus, dirt, dust, or pollen from the nasal cavity. You may do a sinus rinse when you have a cold, a virus, nasal allergy symptoms, a sinus infection, or stuffiness in the nose or sinuses. If you are considering a sinus rinse:  Ask your child's health care provider before performing a sinus rinse on your child.  Do not do a sinus rinse if you have had ear or nasal surgery, ear infection, or blocked ears.  How do I do a sinus rinse?  Wash your hands.  Disinfect your device according to the directions provided and then dry it.  Use the solution that comes with your device or one that is sold separately in stores. Follow the mixing directions on the package.  Fill your device with the amount of saline solution as directed by the device instructions.  Stand over a sink and tilt your head sideways over the sink.  Place the spout of the device in your upper nostril (the one closer to the ceiling).  Gently pour or squeeze the saline solution into  the nasal cavity. The liquid should drain to the lower nostril if you are not overly congested.  Gently blow your nose. Blowing too hard may cause ear pain.  Repeat in the other nostril.  Clean and rinse your device with clean water and then air-dry it. Are there risks of a sinus rinse? Sinus rinse is generally very safe and effective. However, there are a few risks, which include:  A burning sensation in the sinuses. This may happen if you do not make the saline solution as directed. Make sure to follow all directions when making the saline solution.  Infection from contaminated water. This is rare, but possible.  Nasal irritation.  This information is not intended to replace advice given to you by your health care provider. Make sure you discuss any questions you have with your health care provider. Document Released: 04/19/2014 Document Revised: 08/19/2016 Document Reviewed: 02/07/2014 Elsevier Interactive Patient Education  2017 [employer] Sinusitis, Adult Sinusitis is soreness and inflammation of your sinuses. Sinuses are hollow spaces in the bones around your face. Your sinuses are located:  Around your eyes.  In the middle of your forehead.  Behind your nose.  In your cheekbones.  Your sinuses and nasal passages are lined with a stringy fluid (mucus). Mucus normally drains out of your sinuses. When your nasal tissues become inflamed or swollen, the mucus can become trapped or blocked so air cannot flow through  your sinuses. This allows bacteria, viruses, and funguses to grow, which leads to infection. Sinusitis can develop quickly and last for 7?10 days (acute) or for more than 12 weeks (chronic). Sinusitis often develops after a cold. What are the causes? This condition is caused by anything that creates swelling in the sinuses or stops mucus from draining, including:  Allergies.  Asthma.  Bacterial or viral infection.  Abnormally shaped bones between the nasal  passages.  Nasal growths that contain mucus (nasal polyps).  Narrow sinus openings.  Pollutants, such as chemicals or irritants in the air.  A foreign object stuck in the nose.  A fungal infection. This is rare.  What increases the risk? The following factors may make you more likely to develop this condition:  Having allergies or asthma.  Having had a recent cold or respiratory tract infection.  Having structural deformities or blockages in your nose or sinuses.  Having a weak immune system.  Doing a lot of swimming or diving.  Overusing nasal sprays.  Smoking.  What are the signs or symptoms? The main symptoms of this condition are pain and a feeling of pressure around the affected sinuses. Other symptoms include:  Upper toothache.  Earache.  Headache.  Bad breath.  Decreased sense of smell and taste.  A cough that may get worse at night.  Fatigue.  Fever.  Thick drainage from your nose. The drainage is often green and it may contain pus (purulent).  Stuffy nose or congestion.  Postnasal drip. This is when extra mucus collects in the throat or back of the nose.  Swelling and warmth over the affected sinuses.  Sore throat.  Sensitivity to light.  How is this diagnosed? This condition is diagnosed based on symptoms, a medical history, and a physical exam. To find out if your condition is acute or chronic, your health care provider may:  Look in your nose for signs of nasal polyps.  Tap over the affected sinus to check for signs of infection.  View the inside of your sinuses using an imaging device that has a light attached (endoscope).  If your health care provider suspects that you have chronic sinusitis, you may also:  Be tested for allergies.  Have a sample of mucus taken from your nose (nasal culture) and checked for bacteria.  Have a mucus sample examined to see if your sinusitis is related to an allergy.  If your sinusitis does not  respond to treatment and it lasts longer than 8 weeks, you may have an MRI or CT scan to check your sinuses. These scans also help to determine how severe your infection is. In rare cases, a bone biopsy may be done to rule out more serious types of fungal sinus disease. How is this treated? Treatment for sinusitis depends on the cause and whether your condition is chronic or acute. If a virus is causing your sinusitis, your symptoms will go away on their own within 10 days. You may be given medicines to relieve your symptoms, including:  Topical nasal decongestants. They shrink swollen nasal passages and let mucus drain from your sinuses.  Antihistamines. These drugs block inflammation that is triggered by allergies. This can help to ease swelling in your nose and sinuses.  Topical nasal corticosteroids. These are nasal sprays that ease inflammation and swelling in your nose and sinuses.  Nasal saline washes. These rinses can help to get rid of thick mucus in your nose.  If your condition is caused by bacteria,  you will be given an antibiotic medicine. If your condition is caused by a fungus, you will be given an antifungal medicine. Surgery may be needed to correct underlying conditions, such as narrow nasal passages. Surgery may also be needed to remove polyps. Follow these instructions at home: Medicines  Take, use, or apply over-the-counter and prescription medicines only as told by your health care provider. These may include nasal sprays.  If you were prescribed an antibiotic medicine, take it as told by your health care provider. Do not stop taking the antibiotic even if you start to feel better. Hydrate and Humidify  Drink enough water to keep your urine clear or pale yellow. Staying hydrated will help to thin your mucus.  Use a cool mist humidifier to keep the humidity level in your home above 50%.  Inhale steam for 10-15 minutes, 3-4 times a day or as told by your health care  provider. You can do this in the bathroom while a hot shower is running.  Limit your exposure to cool or dry air. Rest  Rest as much as possible.  Sleep with your head raised (elevated).  Make sure to get enough sleep each night. General instructions  Apply a warm, moist washcloth to your face 3-4 times a day or as told by your health care provider. This will help with discomfort.  Wash your hands often with soap and water to reduce your exposure to viruses and other germs. If soap and water are not available, use hand sanitizer.  Do not smoke. Avoid being around people who are smoking (secondhand smoke).  Keep all follow-up visits as told by your health care provider. This is important. Contact a health care provider if:  You have a fever.  Your symptoms get worse.  Your symptoms do not improve within 10 days. Get help right away if:  You have a severe headache.  You have persistent vomiting.  You have pain or swelling around your face or eyes.  You have vision problems.  You develop confusion.  Your neck is stiff.  You have trouble breathing. This information is not intended to replace advice given to you by your health care provider. Make sure you discuss any questions you have with your health care provider. Document Released: 09/22/2005 Document Revised: 05/18/2016 Document Reviewed: 07/18/2015 Elsevier Interactive Patient Education  2017 [employer]. Allergic Rhinitis Allergic rhinitis is when the mucous membranes in the nose respond to allergens. Allergens are particles in the air that cause your body to have an allergic reaction. This causes you to release allergic antibodies. Through a chain of events, these eventually cause you to release histamine into the blood stream. Although meant to protect the body, it is this release of histamine that causes your discomfort, such as frequent sneezing, congestion, and an itchy, runny nose. What are the  causes? Seasonal allergic rhinitis (hay fever) is caused by pollen allergens that may come from grasses, trees, and weeds. Year-round allergic rhinitis (perennial allergic rhinitis) is caused by allergens such as house dust mites, pet dander, and mold spores. What are the signs or symptoms?  Nasal stuffiness (congestion).  Itchy, runny nose with sneezing and tearing of the eyes. How is this diagnosed? Your health care provider can help you determine the allergen or allergens that trigger your symptoms. If you and your health care provider are unable to determine the allergen, skin or blood testing may be used. Your health care provider will diagnose your condition after taking your health  history and performing a physical exam. Your health care provider may assess you for other related conditions, such as asthma, pink eye, or an ear infection. How is this treated? Allergic rhinitis does not have a cure, but it can be controlled by:  Medicines that block allergy symptoms. These may include allergy shots, nasal sprays, and oral antihistamines.  Avoiding the allergen.  Hay fever may often be treated with antihistamines in pill or nasal spray forms. Antihistamines block the effects of histamine. There are over-the-counter medicines that may help with nasal congestion and swelling around the eyes. Check with your health care provider before taking or giving this medicine. If avoiding the allergen or the medicine prescribed do not work, there are many new medicines your health care provider can prescribe. Stronger medicine may be used if initial measures are ineffective. Desensitizing injections can be used if medicine and avoidance does not work. Desensitization is when a patient is given ongoing shots until the body becomes less sensitive to the allergen. Make sure you follow up with your health care provider if problems continue. Follow these instructions at home: It is not possible to completely  avoid allergens, but you can reduce your symptoms by taking steps to limit your exposure to them. It helps to know exactly what you are allergic to so that you can avoid your specific triggers. Contact a health care provider if:  You have a fever.  You develop a cough that does not stop easily (persistent).  You have shortness of breath.  You start wheezing.  Symptoms interfere with normal daily activities. This information is not intended to replace advice given to you by your health care provider. Make sure you discuss any questions you have with your health care provider. Document Released: 06/17/2001 Document Revised: 05/23/2016 Document Reviewed: 05/30/2013 Elsevier Interactive Patient Education  2017 [employer].

## 2017-05-20 NOTE — Progress Notes (Signed)
Subjective:    Patient ID: Jane Riley, female    DOB: 1951/03/27, 66 y.o.   MRN: [id number]  66y/o caucasian female established patient here for sinus pain/pressure/headache/post nasal drip/cough/ear pain and slight teeth discomfort; not responding to xyzal/flonase/saline.  Thinks she has tried singulair but cannot remember.  Last antibiotic Jan levaquin for pneumonia cannot remember which antibiotic has worked best for sinusitis in the past but has take azithromycin, doxycycline and ?keflex.  Allergic penicillins.        Review of Systems  Constitutional: Negative for activity change, appetite change, chills, diaphoresis, fatigue, fever and unexpected weight change.  HENT: Positive for congestion, ear pain, nosebleeds, postnasal drip, rhinorrhea, sinus pain, sinus pressure and sore throat. Negative for dental problem, drooling, ear discharge, facial swelling, hearing loss, mouth sores, sneezing, tinnitus, trouble swallowing and voice change.   Eyes: Negative for photophobia, pain, discharge, redness, itching and visual disturbance.  Respiratory: Positive for cough. Negative for choking, chest tightness, shortness of breath, wheezing and stridor.   Cardiovascular: Negative for chest pain, palpitations and leg swelling.  Gastrointestinal: Negative for abdominal distention, abdominal pain, blood in stool, constipation, diarrhea, nausea and vomiting.  Endocrine: Negative for cold intolerance and heat intolerance.  Genitourinary: Negative for difficulty urinating, dysuria and hematuria.  Musculoskeletal: Negative for arthralgias, back pain, gait problem, joint swelling, myalgias, neck pain and neck stiffness.  Skin: Negative for color change, pallor, rash and wound.  Allergic/Immunologic: Positive for environmental allergies. Negative for food allergies.  Neurological: Positive for headaches. Negative for dizziness, tremors, seizures, syncope, facial asymmetry, speech difficulty, weakness,  light-headedness and numbness.  Hematological: Negative for adenopathy. Does not bruise/bleed easily.  Psychiatric/Behavioral: Negative for agitation, behavioral problems, confusion and sleep disturbance.       Objective:   Physical Exam  Constitutional: She is oriented to person, place, and time. Vital signs are normal. She appears well-developed and well-nourished. She is active and cooperative.  Non-toxic appearance. She does not have a sickly appearance. She does not appear ill. No distress.  HENT:  Head: Normocephalic and atraumatic.  Right Ear: Hearing, external ear and ear canal normal. A middle ear effusion is present.  Left Ear: Hearing, external ear and ear canal normal. A middle ear effusion is present.  Nose: Mucosal edema and rhinorrhea present. No nose lacerations, sinus tenderness, nasal deformity, septal deviation or nasal septal hematoma. No epistaxis.  No foreign bodies. Right sinus exhibits maxillary sinus tenderness. Right sinus exhibits no frontal sinus tenderness. Left sinus exhibits maxillary sinus tenderness. Left sinus exhibits no frontal sinus tenderness.  Mouth/Throat: Uvula is midline and mucous membranes are normal. Mucous membranes are not pale, not dry and not cyanotic. She does not have dentures. No oral lesions. No trismus in the jaw. Normal dentition. No dental abscesses, uvula swelling, lacerations or dental caries. Posterior oropharyngeal edema and posterior oropharyngeal erythema present. No oropharyngeal exudate or tonsillar abscesses.  Bilateral allergic shiners; right auditory canal slight erythema 6 oclock and dry flaky skin; bilateral TMs air fluid level clear; cobblestoning posterior pharynx with thick opaque drip from above; bilateral nasal turbinates edema/erythema with discharge  Eyes: Pupils are equal, round, and reactive to light. Conjunctivae, EOM and lids are normal. Right eye exhibits no chemosis, no discharge, no exudate and no hordeolum. No foreign  body present in the right eye. Left eye exhibits no chemosis, no discharge, no exudate and no hordeolum. No foreign body present in the left eye. Right conjunctiva is not injected. Right conjunctiva has no hemorrhage.  Left conjunctiva is not injected. Left conjunctiva has no hemorrhage. No scleral icterus. Right eye exhibits normal extraocular motion and no nystagmus. Left eye exhibits normal extraocular motion and no nystagmus. Right pupil is round and reactive. Left pupil is round and reactive. Pupils are equal.  Neck: Trachea normal and normal range of motion. Neck supple. No tracheal tenderness present. No neck rigidity. No tracheal deviation, no edema, no erythema and normal range of motion present. No thyroid mass and no thyromegaly present.  Cardiovascular: Normal rate, regular rhythm, S1 normal, S2 normal, normal heart sounds and intact distal pulses.  PMI is not displaced.  Exam reveals no gallop and no friction rub.   No murmur heard. Pulmonary/Chest: Effort normal and breath sounds normal. No accessory muscle usage or stridor. No respiratory distress. She has no decreased breath sounds. She has no wheezes. She has no rhonchi. She has no rales. She exhibits no tenderness.  No cough observed in exam room speaks full sentences without difficulty  Abdominal: Soft. Normal appearance. She exhibits no distension. There is no rigidity and no guarding.  Musculoskeletal: Normal range of motion. She exhibits no edema or tenderness.       Right shoulder: Normal.       Left shoulder: Normal.       Right hip: Normal.       Left hip: Normal.       Right knee: Normal.       Left knee: Normal.       Cervical back: Normal.       Right hand: Normal.       Left hand: Normal.  Lymphadenopathy:       Head (right side): No submental, no submandibular, no tonsillar, no preauricular, no posterior auricular and no occipital adenopathy present.       Head (left side): No submental, no submandibular, no tonsillar,  no preauricular, no posterior auricular and no occipital adenopathy present.    She has no cervical adenopathy.       Right cervical: No superficial cervical, no deep cervical and no posterior cervical adenopathy present.      Left cervical: No superficial cervical, no deep cervical and no posterior cervical adenopathy present.  Neurological: She is alert and oriented to person, place, and time. She has normal strength. She is not disoriented. She displays no atrophy and no tremor. No cranial nerve deficit or sensory deficit. She exhibits normal muscle tone. She displays no seizure activity. Coordination and gait normal. GCS eye subscore is 4. GCS verbal subscore is 5. GCS motor subscore is 6.  On/off exam table; in/out of chair without difficulty gait sure and steady  Skin: Skin is warm, dry and intact. No abrasion, no bruising, no burn, no ecchymosis, no laceration, no lesion, no petechiae and no rash noted. She is not diaphoretic. No cyanosis or erythema. No pallor. Nails show no clubbing.  Psychiatric: She has a normal mood and affect. Her speech is normal and behavior is normal. Judgment and thought content normal. Cognition and memory are normal.  Nursing note and vitals reviewed.         Assessment & Plan:  A-recurrent maxillary sinusitis acute; seasonal allergic rhintiis; bilateral otitis media effusion  P-doxycycline 100mg  po BID x 10 days #20 RF0 electronic Rx. Take with food; wear protective clothing/sunscreen increased risk sunburn while taking doxycycline and split magnesium intake take doxy 1 hour prior or 4 hours after magnesium as it decreases antibiotic absorption.  Doxy may cause stomach upset  taking with food can help. Split flonase 1 spray each nostril BID at home instead of 2 sprays am only.  Continue nasal saline 2 sprays each nostril q2h wa at least BID.   No evidence of systemic bacterial infection, non toxic and well hydrated.  I do not see where any further testing or  imaging is necessary at this time.   I will suggest supportive care, rest, good hygiene and encourage the patient to take adequate fluids.  The patient is to return to clinic or EMERGENCY ROOM if symptoms worsen or change significantly.  Exitcare handout on sinusitis given to patient.  Patient verbalized agreement and understanding of treatment plan and had no further questions at this time.   P2:  Hand washing and cover cough   Consider starting singulair 10mg  po qhs#30 RF3 electronic Rx given.  Continue xyzal 5mg  po daily; flonase, nasal saline.  Patient may use normal saline nasal spray as needed. Avoid triggers if possible.  Shower prior to bedtime if exposed to triggers.  If allergic dust/dust mites recommend mattress/pillow covers/encasements; washing linens, vacuuming, sweeping, dusting weekly.  Call or return to clinic as needed if these symptoms worsen or fail to improve as anticipated.   Exitcare handout on allergic rhinitis and sinus rinse given to patient.  Patient verbalized understanding of instructions, agreed with plan of care and had no further questions at this time.  P2:  Avoidance and hand washing.  Supportive treatment.   No evidence of invasive bacterial infection, non toxic and well hydrated.  This is most likely self limiting viral infection.  I do not see where any further testing or imaging is necessary at this time.   I will suggest supportive care, rest, good hygiene and encourage the patient to take adequate fluids.  The patient is to return to clinic or EMERGENCY ROOM if symptoms worsen or change significantly e.g. ear pain, fever, purulent discharge from ears or bleeding.  Exitcare handout on otitis media with effusion given to patient.  Patient verbalized agreement and understanding of treatment plan.

## 2017-05-28 ENCOUNTER — Telehealth: Payer: Self-pay

## 2017-05-28 ENCOUNTER — Ambulatory Visit: Payer: Self-pay | Admitting: Adult Health

## 2017-05-28 ENCOUNTER — Encounter: Payer: Self-pay | Admitting: Adult Health

## 2017-05-28 VITALS — BP 130/80 | HR 80 | Temp 97.8°F | Resp 18

## 2017-05-28 DIAGNOSIS — B029 Zoster without complications: Secondary | ICD-10-CM

## 2017-05-28 DIAGNOSIS — M961 Postlaminectomy syndrome, not elsewhere classified: Secondary | ICD-10-CM | POA: Diagnosis not present

## 2017-05-28 MED ORDER — VALACYCLOVIR HCL 500 MG PO TABS: 500.0000 mg | ORAL_TABLET | Freq: Three times a day (TID) | ORAL | 0 refills | Status: AC

## 2017-05-28 MED ORDER — LIDOCAINE 5 % EX OINT: 1.0000 "application " | TOPICAL_OINTMENT | Freq: Three times a day (TID) | CUTANEOUS | 0 refills | Status: DC | PRN

## 2017-05-28 NOTE — Progress Notes (Signed)
Subjective:     Patient ID: Jane Riley, female   DOB: 01/16/1951, 66 y.o.   MRN: [id number]  HPI   Patient is a 66 year old female in no acute distress, who presents to the clinic for burning and mild itching in her left inner thigh times one week, then she reports she broke out with a small rash on 05/27/17. She reports area continue to burn and itch.  She denies any fevers, chills, nausea, or vomiting. She denies any exposures or insect bites or trauma/ injury. She does report a history of Shingles. She reports she has had the Shingles Vaccine but is unsure of which one- not recent- date unknown.   Patient Active Problem List   Diagnosis Date Noted  . Anxiety 05/20/2017  . Essential hypertension 05/20/2017  . Gastroesophageal reflux disease without esophagitis 05/20/2017  . Mixed hyperlipidemia 05/20/2017  . Allergic rhinitis 12/15/2016  . Vaccine counseling 01/07/2016   Allergies  Allergen Reactions  . Adhesive [Tape]     Skin irritation.  Tegaderm OK  . Codeine Nausea Only and Nausea And Vomiting  . Nsaids Other (See Comments)    GI upset  . Other Itching    Narcotic pain medications- causes itching  . Pravastatin Other (See Comments)  . Propoxyphene Itching  . Amoxicillin Rash    Caused skin blister on back of left leg  . Penicillins Itching and Rash   Vitals:   05/28/17 1116  BP: 130/80  Pulse: 80  Resp: 18  Temp: 97.8 F (36.6 C)  SpO2: 97%    Review of Systems  Constitutional: Negative.  Negative for activity change, appetite change, chills, diaphoresis, fatigue, fever and unexpected weight change.  HENT: Negative for congestion, dental problem, drooling, ear discharge, ear pain, facial swelling, hearing loss, mouth sores, nosebleeds, postnasal drip, rhinorrhea, sinus pain, sinus pressure, sneezing, sore throat, tinnitus, trouble swallowing and voice change.   Eyes: Negative for photophobia, pain, discharge, redness, itching and visual disturbance.  Respiratory:  Negative for apnea, cough, choking, chest tightness, shortness of breath, wheezing and stridor.   Cardiovascular: Negative for chest pain, palpitations and leg swelling.  Gastrointestinal: Negative for abdominal distention, abdominal pain, anal bleeding, blood in stool, constipation, diarrhea, nausea, rectal pain and vomiting.  Endocrine: Negative for cold intolerance, heat intolerance, polydipsia, polyphagia and polyuria.  Genitourinary: Negative for decreased urine volume, difficulty urinating, dyspareunia, dysuria, enuresis, flank pain, frequency, genital sores, hematuria, menstrual problem, pelvic pain, urgency, vaginal bleeding, vaginal discharge and vaginal pain.  Musculoskeletal: Negative for arthralgias, back pain, gait problem, joint swelling, myalgias, neck pain and neck stiffness.  Skin: Positive for rash (left inner thigh present since yesterday. With burning and itching. ). Negative for color change, pallor and wound.  Allergic/Immunologic: Negative for environmental allergies, food allergies and immunocompromised state.  Neurological: Negative for dizziness, tremors, seizures, syncope, facial asymmetry, speech difficulty, weakness, light-headedness, numbness and headaches.  Hematological: Negative for adenopathy. Does not bruise/bleed easily.  Psychiatric/Behavioral: Negative for agitation, behavioral problems, confusion, decreased concentration, dysphoric mood, hallucinations, self-injury, sleep disturbance and suicidal ideas. The patient is not nervous/anxious and is not hyperactive.        Objective:   Physical Exam  Constitutional: She is oriented to person, place, and time. Vital signs are normal. She appears well-developed and well-nourished. She is active. No distress. She is not intubated.  HENT:  Head: Normocephalic and atraumatic.  Right Ear: External ear normal.  Left Ear: External ear normal.  Nose: Nose normal.  Mouth/Throat: No  oropharyngeal exudate.  Eyes: Pupils  are equal, round, and reactive to light. Conjunctivae, EOM and lids are normal. Right eye exhibits no discharge and no exudate. Left eye exhibits no discharge and no exudate. Right conjunctiva is not injected. Right conjunctiva has no hemorrhage. Left conjunctiva is not injected. Left conjunctiva has no hemorrhage. No scleral icterus.  Neck: Trachea normal, normal range of motion, full passive range of motion without pain and phonation normal. Neck supple. No tracheal tenderness, no spinous process tenderness and no muscular tenderness present. No neck rigidity. No tracheal deviation, no edema, no erythema and normal range of motion present. No Brudzinski's sign and no Kernig's sign noted.  Cardiovascular: Normal rate, regular rhythm, S1 normal, S2 normal, normal heart sounds, intact distal pulses and normal pulses.  Exam reveals no gallop, no distant heart sounds and no friction rub.   No murmur heard. Pulses:      Radial pulses are 2+ on the right side, and 2+ on the left side.       Dorsalis pedis pulses are 2+ on the right side, and 2+ on the left side.       Posterior tibial pulses are 2+ on the right side, and 2+ on the left side.  Pulmonary/Chest: Effort normal and breath sounds normal. No accessory muscle usage or stridor. No apnea, no tachypnea and no bradypnea. She is not intubated. No respiratory distress. She has no wheezes. She has no rales. She exhibits no tenderness.  Abdominal: Soft. Normal aorta and bowel sounds are normal. She exhibits no shifting dullness, no distension, no pulsatile liver, no fluid wave, no abdominal bruit, no ascites, no pulsatile midline mass and no mass. There is no hepatosplenomegaly, splenomegaly or hepatomegaly. There is no tenderness. There is no rebound, no guarding and no CVA tenderness. No hernia.  Musculoskeletal: Normal range of motion. She exhibits no edema, tenderness or deformity.  Lymphadenopathy:       Head (right side): No submental, no  submandibular, no tonsillar, no preauricular, no posterior auricular and no occipital adenopathy present.       Head (left side): No submental, no submandibular, no tonsillar, no preauricular, no posterior auricular and no occipital adenopathy present.    She has no cervical adenopathy.       Right cervical: No superficial cervical, no deep cervical and no posterior cervical adenopathy present.      Left cervical: No superficial cervical, no deep cervical and no posterior cervical adenopathy present.  Neurological: She is alert and oriented to person, place, and time. She has normal strength and normal reflexes. She displays no atrophy and no tremor. No cranial nerve deficit or sensory deficit. She exhibits normal muscle tone. She displays no seizure activity. Coordination and gait normal. GCS eye subscore is 4. GCS verbal subscore is 5. GCS motor subscore is 6.  Skin: Skin is warm, dry and intact. Lesion (measuring ine inch left inner thigh in arera of L2 Dermatone) and rash noted. No abrasion, no bruising, no burn, no ecchymosis and no petechiae noted. Rash is vesicular (small clear fluid filled bullae within erythematous base measuring 1 inch/ currently no other lesions. ). She is not diaphoretic. No cyanosis or erythema (mild ). No pallor. Nails show no clubbing.     Lesion area marked on diagram  Psychiatric: She has a normal mood and affect. Her speech is normal and behavior is normal. Judgment and thought content normal. Cognition and memory are normal.  Vitals reviewed.  Assessment:        Herpes zoster without complication - Plan: CBC with Differential/Platelet, Comprehensive metabolic panel No recent labs with starting medications as below will check.  Plan:      Shingles likely E Prescribed as below.  Meds ordered this encounter  Medications  . valACYclovir (VALTREX) 500 MG tablet    Sig: Take 1 tablet (500 mg total) by mouth 3 (three) times daily.    Dispense:  21 tablet     Refill:  0  . lidocaine (XYLOCAINE) 5 % ointment    Sig: Apply 1 application topically 3 (three) times daily as needed. May also use over the counter Calimine for itch.    Dispense:  50 g    Refill:  0    May also use Calamine lotion per package instructions for itch.  May use Motrin per package instructions for pain.  Return if symptoms worsen or fail to improve in 72 hours or any eye /face involvement , for Seek ER/UC if after hours. . Return to the clinic or be seen in urgent care if any facial or eye involvement or if symptoms fail to improve or worsen at anytime. Handout on Shingles given ad reviewed.  Patient verbalized understanding of instructions and denies any further questions at this time.

## 2017-05-28 NOTE — Patient Instructions (Addendum)
Shingles Shingles, which is also known as herpes zoster, is an infection that causes a painful skin rash and fluid-filled blisters. Shingles is not related to genital herpes, which is a sexually transmitted infection. Shingles only develops in people who:  Have had chickenpox.  Have received the chickenpox vaccine. (This is rare.)  What are the causes? Shingles is caused by varicella-zoster virus (VZV). This is the same virus that causes chickenpox. After exposure to VZV, the virus stays in the body in an inactive (dormant) state. Shingles develops if the virus reactivates. This can happen many years after the initial exposure to VZV. It is not known what causes this virus to reactivate. What increases the risk? People who have had chickenpox or received the chickenpox vaccine are at risk for shingles. Infection is more common in people who:  Are older than age 50.  Have a weakened defense (immune) system, such as those with HIV, AIDS, or cancer.  Are taking medicines that weaken the immune system, such as transplant medicines.  Are under great stress.  What are the signs or symptoms? Early symptoms of this condition include itching, tingling, and pain in an area on your skin. Pain may be described as burning, stabbing, or throbbing. A few days or weeks after symptoms start, a painful red rash appears, usually on one side of the body in a bandlike or beltlike pattern. The rash eventually turns into fluid-filled blisters that break open, scab over, and dry up in about 2-3 weeks. At any time during the infection, you may also develop:  A fever.  Chills.  A headache.  An upset stomach.  How is this diagnosed? This condition is diagnosed with a skin exam. Sometimes, skin or fluid samples are taken from the blisters before a diagnosis is made. These samples are examined under a microscope or sent to a lab for testing. How is this treated? There is no specific cure for this condition.  Your health care provider will probably prescribe medicines to help you manage pain, recover more quickly, and avoid long-term problems. Medicines may include:  Antiviral drugs.  Anti-inflammatory drugs.  Pain medicines.  If the area involved is on your face, you may be referred to a specialist, such as an eye doctor (ophthalmologist) or an ear, nose, and throat (ENT) doctor to help you avoid eye problems, chronic pain, or disability. Follow these instructions at home: Medicines  Take medicines only as directed by your health care provider.  Apply an anti-itch or numbing cream to the affected area as directed by your health care provider. Blister and Rash Care  Take a cool bath or apply cool compresses to the area of the rash or blisters as directed by your health care provider. This may help with pain and itching.  Keep your rash covered with a loose bandage (dressing). Wear loose-fitting clothing to help ease the pain of material rubbing against the rash.  Keep your rash and blisters clean with mild soap and cool water or as directed by your health care provider.  Check your rash every day for signs of infection. These include redness, swelling, and pain that lasts or increases.  Do not pick your blisters.  Do not scratch your rash. General instructions  Rest as directed by your health care provider.  Keep all follow-up visits as directed by your health care provider. This is important.  Until your blisters scab over, your infection can cause chickenpox in people who have never had it or been vaccinated   against it. To prevent this from happening, avoid contact with other people, especially: ? Babies. ? Pregnant women. ? Children who have eczema. ? Elderly people who have transplants. ? People who have chronic illnesses, such as leukemia or AIDS. Contact a health care provider if:  Your pain is not relieved with prescribed medicines.  Your pain does not get better after  the rash heals.  Your rash looks infected. Signs of infection include redness, swelling, and pain that lasts or increases. Get help right away if:  The rash is on your face or nose.  You have facial pain, pain around your eye area, or loss of feeling on one side of your face.  You have ear pain or you have ringing in your ear.  You have loss of taste.  Your condition gets worse. This information is not intended to replace advice given to you by your health care provider. Make sure you discuss any questions you have with your health care provider. Document Released: 09/22/2005 Document Revised: 05/18/2016 Document Reviewed: 08/03/2014 Elsevier Interactive Patient Education  2017 [place] de la vlvula artica, cuidados posteriores (Aortic Valve Replacement, Care After) Siga estas instrucciones durante las prximas semanas. Estas indicaciones le proporcionan informacin acerca de cmo deber cuidarse despus del procedimiento. El mdico tambin podr darle instrucciones ms especficas. El tratamiento ha sido planificado segn las prcticas mdicas actuales, pero en algunos casos pueden ocurrir problemas. Comunquese con el mdico si tiene algn problema o dudas despus del procedimiento. QU ESPERAR DESPUS DEL PROCEDIMIENTO Despus del procedimiento, es comn [employer] siguientes sntomas:  Dolor alrededor de la incisin.  Una pequea cantidad de sangre o un lquido transparente que sale de la incisin. INSTRUCCIONES PARA EL CUIDADO EN EL HOGAR Comida y bebida  Siga las indicaciones del mdico respecto de las restricciones para las comidas o las bebidas. ? Limite el consumo de alcohol a no ms de 1 medida por da si es mujer y no est Music therapist, y 2 medidas si es hombre. Una medida equivale a 12onzas de cerveza, 5onzas de vino o 1onzas de bebidas alcohlicas de alta graduacin. ? Limite la cantidad de cafena que bebe. La cafena puede afectar el ritmo y la  frecuencia cardacos.  Beba suficiente lquido para Consulting civil engineer orina clara o de color amarillo plido.  Consuma una dieta cardiosaludable. Debe incluir una gran cantidad de frutas y verduras frescas. Si come carne, deben ser cortes magros. Evite alimentos que: ? Tengan altos contenidos de sal, grasa saturada o azcar. ? Estn enlatados o muy procesados. ? Estn fritos. Actividad  Reanude sus actividades normales como se lo haya indicado el mdico. Pregntele al mdico qu actividades son seguras para usted.  Una vez que se recupere, haga actividad fsica con regularidad, como se lo haya indicado el mdico.  Evite permanecer sentado, sin moverse, durante ms de 2 horas consecutivas. Levntese y camine al menos una vez cada 1 o 2horas. Esto ayuda a evitar la formacin de cogulos sanguneos en las piernas.  No levante ningn objeto que pese ms de 10libras (4,5kg) hasta que el mdico lo autorice.  Evite [employer] para empujar o jalar objetos hasta que el mdico lo autorice. Esto incluye apoyarse en los pasamanos para subir escaleras. Cuidado de la incisin  Siga las indicaciones del mdico acerca del cuidado de la incisin. Haga lo siguiente: ? Lvese las manos con agua y jabn antes de Quarry manager las vendas (vendaje). Use desinfectante para manos si no dispone de [place]  y [place]. ? Cambie el vendaje como se lo haya indicado el mdico. ? No retire los puntos (suturas), el [employer] para la piel o las tiras [place]. Es posible que estos deban quedar puestos en la piel durante 2semanas o ms tiempo. Si los bordes de las tiras adhesivas empiezan a despegarse y Therapist, sports, puede recortar los que estn sueltos. No retire las tiras [facility] por completo a menos que el mdico se lo indique.  [place] zona de la incisin para detectar signos de infeccin. Est atento a los siguientes signos: ? Aumento del enrojecimiento, la hinchazn o Conservation officer, historic buildings. ? Ms lquido Delorise Shiner. ? Calor. ? Pus o mal olor. Medicamentos  [employer] de venta libre y los recetados solamente como se lo haya indicado el mdico.  Si le recetaron un antibitico, tmelo como se lo haya indicado el mdico. No deje de tomar los antibiticos aunque comience a sentirse mejor. Viajes  Technical sales engineer en avin durante el tiempo que le haya indicado el mdico.  Cuando viaje, lleve una lista de los medicamentos que [place] y un registro de sus antecedentes mdicos. Lleve los medicamentos con usted. Conducir  Pregntele al mdico cundo es seguro volver a Forensic psychologist. No conduzca hasta que el mdico lo autorice.  No conduzca ni opere maquinaria pesada mientras toma analgsicos recetados. Estilo de vida  No consuma ningn producto que contenga tabaco, lo que incluye cigarrillos, tabaco de Higher education careers adviser y Psychologist, sport and exercise. Si necesita ayuda para dejar de fumar, consulte al mdico.  Reanude la actividad sexual como se lo haya indicado el mdico. No utilice medicamentos para la disfuncin erctil a menos que el mdico lo autorice, si corresponde.  Trabaje con el mdico para ayudar a Aeronautical engineer presin arterial y Freight forwarder, y a Chief Technology Officer y Air cabin crew cualquier otra afeccin cardaca que tenga.  Mantenga un peso saludable. Instrucciones generales  No tome baos de inmersin, no nade ni use el jacuzzi hasta que el mdico lo autorice.  No haga fuerza para defecar.  Evite cruzar las piernas cuando est sentado.  Mdase la [employer]. La fiebre puede ser signo de infeccin.  Si usted es [place] y planea quedar embarazada, hable con su mdico antes de hacerlo.  Use medias de compresin si el mdico as se lo indica. Estas medias ayudan a Mining engineer formacin de cogulos sanguneos y a Forensic psychologist de las piernas.  Dgales a todos los [employer] lo atienden que tiene una vlvula artica artificial (protsica). Si tiene o ha tenido cardiopata coronaria o  endocarditis, tambin dgaselo a todos los mdicos que lo atienden.  Concurra a todas las visitas de control como se lo haya indicado el mdico. Esto es importante. SOLICITE ATENCIN MDICA SI:  Tiene una erupcin cutnea.  Experimenta cambios bruscos e inexplicables en el peso.  Aumentan el enrojecimiento, la hinchazn o el dolor alrededor de la incisin.  Le sale ms lquido o sangre de la incisin.  La incisin est caliente al tacto.  Tiene pus o percibe que sale mal olor del lugar de la incisin.  Tiene fiebre.  SOLICITE ATENCIN MDICA DE INMEDIATO SI:  Siente dolor en el pecho que es diferente del de la incisin.  Le falta el aire o tiene dificultad para respirar.  Comienza a sentirse mareado. Estos sntomas pueden representar un problema grave que constituye Engineer, maintenance (IT). No espere hasta que los sntomas desaparezcan. Solicite atencin mdica de inmediato. Comunquese con el servicio de emergencias de su  localidad (911 en los [place]). No conduzca por sus propios medios [employer]. Esta informacin no tiene Marine scientist el consejo del mdico. Asegrese de hacerle al mdico cualquier pregunta que tenga. Document Released: 10/25/2010 Document Revised: 01/14/2016 Document Reviewed: 08/26/2015 Elsevier Interactive Patient Education  2017 [employer].

## 2017-05-29 ENCOUNTER — Telehealth: Payer: Self-pay

## 2017-05-29 LAB — COMPREHENSIVE METABOLIC PANEL
ALBUMIN: 4.7 g/dL (ref 3.6–4.8)
ALT: 19 IU/L (ref 0–32)
AST: 28 IU/L (ref 0–40)
Albumin/Globulin Ratio: 2.2 (ref 1.2–2.2)
Alkaline Phosphatase: 53 IU/L (ref 39–117)
BUN / CREAT RATIO: 17 (ref 12–28)
BUN: 13 mg/dL (ref 8–27)
Bilirubin Total: 0.3 mg/dL (ref 0.0–1.2)
CALCIUM: 9.5 mg/dL (ref 8.7–10.3)
CO2: 23 mmol/L (ref 20–29)
CREATININE: 0.76 mg/dL (ref 0.57–1.00)
Chloride: 100 mmol/L (ref 96–106)
GFR calc non Af Amer: 83 mL/min/{1.73_m2} (ref >59)
GFR, EST AFRICAN AMERICAN: 96 mL/min/{1.73_m2} (ref >59)
GLUCOSE: 108 mg/dL — AB (ref 65–99)
Globulin, Total: 2.1 g/dL (ref 1.5–4.5)
Potassium: 3.7 mmol/L (ref 3.5–5.2)
SODIUM: 142 mmol/L (ref 134–144)
TOTAL PROTEIN: 6.8 g/dL (ref 6.0–8.5)

## 2017-05-29 LAB — CBC WITH DIFFERENTIAL/PLATELET
BASOS ABS: 0 10*3/uL (ref 0.0–0.2)
Basos: 0 %
EOS (ABSOLUTE): 0.1 10*3/uL (ref 0.0–0.4)
EOS: 1 %
HEMATOCRIT: 40.4 % (ref 34.0–46.6)
HEMOGLOBIN: 13.5 g/dL (ref 11.1–15.9)
IMMATURE GRANS (ABS): 0 10*3/uL (ref 0.0–0.1)
Immature Granulocytes: 0 %
LYMPHS: 21 %
Lymphocytes Absolute: 1.4 10*3/uL (ref 0.7–3.1)
MCH: 30.4 pg (ref 26.6–33.0)
MCHC: 33.4 g/dL (ref 31.5–35.7)
MCV: 91 fL (ref 79–97)
MONOCYTES: 6 %
Monocytes Absolute: 0.4 10*3/uL (ref 0.1–0.9)
Neutrophils Absolute: 4.8 10*3/uL (ref 1.4–7.0)
Neutrophils: 72 %
Platelets: 226 10*3/uL (ref 150–379)
RBC: 4.44 x10E6/uL (ref 3.77–5.28)
RDW: 13 % (ref 12.3–15.4)
WBC: 6.7 10*3/uL (ref 3.4–10.8)

## 2017-05-29 NOTE — Progress Notes (Signed)
Pt informed that lab results are WNL. She verbalizes understanding to return to clinic if needed for worsening sx.

## 2017-05-29 NOTE — Telephone Encounter (Signed)
Pt informed that lab results are WNL. She verbalizes understanding to return to clinic if needed for worsening sx.

## 2017-06-11 DIAGNOSIS — K5730 Diverticulosis of large intestine without perforation or abscess without bleeding: Secondary | ICD-10-CM | POA: Diagnosis not present

## 2017-06-11 DIAGNOSIS — K648 Other hemorrhoids: Secondary | ICD-10-CM | POA: Diagnosis not present

## 2017-06-11 DIAGNOSIS — Z86010 Personal history of colonic polyps: Secondary | ICD-10-CM | POA: Diagnosis not present

## 2017-06-11 DIAGNOSIS — K640 First degree hemorrhoids: Secondary | ICD-10-CM | POA: Diagnosis not present

## 2017-06-18 DIAGNOSIS — H26491 Other secondary cataract, right eye: Secondary | ICD-10-CM | POA: Diagnosis not present

## 2017-06-18 DIAGNOSIS — H2512 Age-related nuclear cataract, left eye: Secondary | ICD-10-CM | POA: Diagnosis not present

## 2017-06-25 DIAGNOSIS — H524 Presbyopia: Secondary | ICD-10-CM | POA: Insufficient documentation

## 2017-06-25 DIAGNOSIS — H2512 Age-related nuclear cataract, left eye: Secondary | ICD-10-CM | POA: Insufficient documentation

## 2017-06-29 DIAGNOSIS — M961 Postlaminectomy syndrome, not elsewhere classified: Secondary | ICD-10-CM | POA: Diagnosis not present

## 2017-06-29 DIAGNOSIS — I10 Essential (primary) hypertension: Secondary | ICD-10-CM | POA: Diagnosis not present

## 2017-06-29 DIAGNOSIS — Z6826 Body mass index (BMI) 26.0-26.9, adult: Secondary | ICD-10-CM | POA: Diagnosis not present

## 2017-06-29 DIAGNOSIS — M5412 Radiculopathy, cervical region: Secondary | ICD-10-CM | POA: Diagnosis not present

## 2017-07-03 ENCOUNTER — Ambulatory Visit: Payer: Self-pay | Admitting: Adult Health

## 2017-07-03 ENCOUNTER — Encounter: Payer: Self-pay | Admitting: Adult Health

## 2017-07-03 VITALS — BP 140/80 | HR 101 | Temp 102.4°F | Resp 16 | Ht 64.0 in | Wt 150.0 lb

## 2017-07-03 DIAGNOSIS — H6691 Otitis media, unspecified, right ear: Secondary | ICD-10-CM

## 2017-07-03 DIAGNOSIS — R8290 Unspecified abnormal findings in urine: Secondary | ICD-10-CM

## 2017-07-03 DIAGNOSIS — R509 Fever, unspecified: Secondary | ICD-10-CM

## 2017-07-03 LAB — CBC WITH DIFFERENTIAL/PLATELET
BASOS ABS: 0 10*3/uL (ref 0.0–0.2)
Basos: 0 %
EOS (ABSOLUTE): 0 10*3/uL (ref 0.0–0.4)
EOS: 0 %
HEMOGLOBIN: 12.9 g/dL (ref 11.1–15.9)
Hematocrit: 38.1 % (ref 34.0–46.6)
Immature Grans (Abs): 0 10*3/uL (ref 0.0–0.1)
Immature Granulocytes: 0 %
LYMPHS: 5 %
Lymphocytes Absolute: 0.5 10*3/uL — ABNORMAL LOW (ref 0.7–3.1)
MCH: 30.9 pg (ref 26.6–33.0)
MCHC: 33.9 g/dL (ref 31.5–35.7)
MCV: 91 fL (ref 79–97)
MONOCYTES: 7 %
Monocytes Absolute: 0.9 10*3/uL (ref 0.1–0.9)
NEUTROS PCT: 88 %
Neutrophils Absolute: 10.1 10*3/uL — ABNORMAL HIGH (ref 1.4–7.0)
PLATELETS: 167 10*3/uL (ref 150–379)
RBC: 4.17 x10E6/uL (ref 3.77–5.28)
RDW: 12.9 % (ref 12.3–15.4)
WBC: 11.6 10*3/uL — AB (ref 3.4–10.8)

## 2017-07-03 LAB — POCT URINALYSIS DIPSTICK
Blood, UA: NEGATIVE
Glucose, UA: NEGATIVE
KETONES UA: POSITIVE
Nitrite, UA: NEGATIVE
SPEC GRAV UA: 1.01 (ref 1.010–1.025)
pH, UA: 6.5 (ref 5.0–8.0)

## 2017-07-03 LAB — POCT INFLUENZA A/B

## 2017-07-03 MED ORDER — LEVOFLOXACIN 500 MG PO TABS: 500.0000 mg | ORAL_TABLET | Freq: Every day | ORAL | 0 refills | Status: DC

## 2017-07-03 NOTE — Patient Instructions (Addendum)
Otitis Media, Adult Otitis media is redness, soreness, and puffiness (swelling) in the space just behind your eardrum (middle ear). It may be caused by allergies or infection. It often happens along with a cold. Follow these instructions at home:  Take your medicine as told. Finish it even if you start to feel better.  Only take over-the-counter or prescription medicines for pain, discomfort, or fever as told by your doctor.  Follow up with your doctor as told. Contact a doctor if:  You have otitis media only in one ear, or bleeding from your nose, or both.  You notice a lump on your neck.  You are not getting better in 3-5 days.  You feel worse instead of better. Get help right away if:  You have pain that is not helped with medicine.  You have puffiness, redness, or pain around your ear.  You get a stiff neck.  You cannot move part of your face (paralysis).  You notice that the bone behind your ear hurts when you touch it. This information is not intended to replace advice given to you by your health care provider. Make sure you discuss any questions you have with your health care provider. Document Released: 03/10/2008 Document Revised: 02/28/2016 Document Reviewed: 04/19/2013 Elsevier Interactive Patient Education  2017 [employer] Viral Illness, Adult Viruses are tiny germs that can get into a person's body and cause illness. There are many different types of viruses, and they cause many types of illness. Viral illnesses can range from mild to severe. They can affect various parts of the body. Common illnesses that are caused by a virus include colds and the flu. Viral illnesses also include serious conditions such as HIV/AIDS (human immunodeficiency virus/acquired immunodeficiency syndrome). A few viruses have been linked to certain cancers. What are the causes? Many types of viruses can cause illness. Viruses invade cells in your body, multiply, and cause the infected  cells to malfunction or die. When the cell dies, it releases more of the virus. When this happens, you develop symptoms of the illness, and the virus continues to spread to other cells. If the virus takes over the function of the cell, it can cause the cell to divide and grow out of control, as is the case when a virus causes cancer. Different viruses get into the body in different ways. You can get a virus by:  Swallowing food or water that is contaminated with the virus.  Breathing in droplets that have been coughed or sneezed into the air by an infected person.  Touching a surface that has been contaminated with the virus and then touching your eyes, nose, or mouth.  Being bitten by an insect or animal that carries the virus.  Having sexual contact with a person who is infected with the virus.  Being exposed to blood or fluids that contain the virus, either through an open cut or during a transfusion.  If a virus enters your body, your body's defense system (immune system) will try to fight the virus. You may be at higher risk for a viral illness if your immune system is weak. What are the signs or symptoms? Symptoms vary depending on the type of virus and the location of the cells that it invades. Common symptoms of the main types of viral illnesses include: Cold and flu viruses  Fever.  Headache.  Sore throat.  Muscle aches.  Nasal congestion.  Cough. Digestive system (gastrointestinal) viruses  Fever.  Abdominal pain.  Nausea.  Diarrhea. Liver viruses (hepatitis)  Loss of appetite.  Tiredness.  Yellowing of the skin (jaundice). Brain and spinal cord viruses  Fever.  Headache.  Stiff neck.  Nausea and vomiting.  Confusion or sleepiness. Skin viruses  Warts.  Itching.  Rash. Sexually transmitted viruses  Discharge.  Swelling.  Redness.  Rash. How is this treated? Viruses can be difficult to treat because they live within cells. Antibiotic  medicines do not treat viruses because these drugs do not get inside cells. Treatment for a viral illness may include:  Resting and drinking plenty of fluids.  Medicines to relieve symptoms. These can include over-the-counter medicine for pain and fever, medicines for cough or congestion, and medicines to relieve diarrhea.  Antiviral medicines. These drugs are available only for certain types of viruses. They may help reduce flu symptoms if taken early. There are also many antiviral medicines for hepatitis and HIV/AIDS.  Some viral illnesses can be prevented with vaccinations. A common example is the flu shot. Follow these instructions at home: Medicines   Take over-the-counter and prescription medicines only as told by your health care provider.  If you were prescribed an antiviral medicine, take it as told by your health care provider. Do not stop taking the medicine even if you start to feel better.  Be aware of when antibiotics are needed and when they are not needed. Antibiotics do not treat viruses. If your health care provider thinks that you may have a bacterial infection as well as a viral infection, you may get an antibiotic. ? Do not ask for an antibiotic prescription if you have been diagnosed with a viral illness. That will not make your illness go away faster. ? Frequently taking antibiotics when they are not needed can lead to antibiotic resistance. When this develops, the medicine no longer works against the bacteria that it normally fights. General instructions  Drink enough fluids to keep your urine clear or pale yellow.  Rest as much as possible.  Return to your normal activities as told by your health care provider. Ask your health care provider what activities are safe for you.  Keep all follow-up visits as told by your health care provider. This is important. How is this prevented? Take these actions to reduce your risk of viral infection:  Eat a healthy diet and  get enough rest.  Wash your hands often with soap and water. This is especially important when you are in public places. If soap and water are not available, use hand sanitizer.  Avoid close contact with friends and family who have a viral illness.  If you travel to areas where viral gastrointestinal infection is common, avoid drinking water or eating raw food.  Keep your immunizations up to date. Get a flu shot every year as told by your health care provider.  Do not share toothbrushes, nail clippers, razors, or needles with other people.  Always practice safe sex.  Contact a health care provider if:  You have symptoms of a viral illness that do not go away.  Your symptoms come back after going away.  Your symptoms get worse. Get help right away if:  You have trouble breathing.  You have a severe headache or a stiff neck.  You have severe vomiting or abdominal pain. This information is not intended to replace advice given to you by your health care provider. Make sure you discuss any questions you have with your health care provider. Document Released: 02/01/2016 Document Revised: 03/05/2016 Document Reviewed:  02/01/2016 Elsevier Interactive Patient Education  [employer].

## 2017-07-03 NOTE — Progress Notes (Addendum)
Subjective:     Patient ID: Jane Riley, female   DOB: 08-28-1951, 66 y.o.   MRN: [id number]  HPI  Patient is a 66 year old female in no acute distress who presents to this clinic. She reports she has had head congestion since Tuesday 06/30/17.  She reports cough and lots of post nasal drip. She reports right ear pain and itching.  She has been mildly nauseated.  She reports she started running fever last night  07/02/17 - she did not take her temperature.  She reports she has had chills and generalized aching today. She does report that her urine has been decreased some though she feels this from drinking less, she does report normal urine stream she has urinated four times today. She denies any foul odor or hematuria.   She denies any pain.She denies any chest pain, shortness of breath, vomiting or diarrhea. She denies any recent ill contacts or exposures. No one else is sick at home.     She has been drinking lots of water.  Patient Active Problem List   Diagnosis Date Noted  . Shingles 05/28/2017  . Anxiety 05/20/2017  . Essential hypertension 05/20/2017  . Gastroesophageal reflux disease without esophagitis 05/20/2017  . Mixed hyperlipidemia 05/20/2017  . Allergic rhinitis 12/15/2016  . Vaccine counseling 01/07/2016   Vitals:   07/03/17 1330  Weight: 150 lb (68 kg)  Height: 5\' 4"  (1.626 m)  Blood pressure 140/80, pulse (!) 101, temperature (!) 102.4 F (39.1 C), temperature source Tympanic, resp. rate 16, height 5\' 4"  (1.626 m), weight 150 lb (68 kg), SpO2 97 %. Recheck heart rate 94 at rest.  Allergies  Allergen Reactions  . Adhesive [Tape]     Skin irritation.  Tegaderm OK  . Codeine Nausea Only and Nausea And Vomiting  . Nsaids Other (See Comments)    GI upset  . Other Itching    Narcotic pain medications- causes itching  . Pravastatin Other (See Comments)  . Propoxyphene Itching  . Amoxicillin Rash    Caused skin blister on back of left leg  . Penicillins Itching  and Rash      Current Outpatient Prescriptions:  .  Biotin 5000 MCG TABS, Take by mouth daily., Disp: , Rfl:  .  Calcium Carbonate-Vitamin D (CALCIUM 500/D PO), Take by mouth daily., Disp: , Rfl:  .  Cholecalciferol (VITAMIN D3) 1000 units CAPS, Take by mouth daily., Disp: , Rfl:  .  citalopram (CELEXA) 20 MG tablet, Take 20 mg by mouth daily., Disp: , Rfl:  .  Coenzyme Q10 (COQ10) 200 MG CAPS, Take by mouth daily., Disp: , Rfl:  .  DHA-EPA-Flaxseed Oil-Vitamin E (THERATEARS NUTRITION) CAPS, Take 3 capsules by mouth daily before breakfast., Disp: , Rfl:  .  DICLOFENAC POTASSIUM PO, Take 50 mg by mouth daily., Disp: , Rfl:  .  doxycycline (VIBRA-TABS) 100 MG tablet, Take 1 tablet (100 mg total) by mouth 2 (two) times daily., Disp: 20 tablet, Rfl: 0 .  fluticasone (FLONASE) 50 MCG/ACT nasal spray, Place 1 spray into both nostrils 2 (two) times daily., Disp: 16 g, Rfl: 11 .  levocetirizine (XYZAL) 5 MG tablet, Take 5 mg by mouth every evening., Disp: , Rfl:  .  lidocaine (XYLOCAINE) 5 % ointment, Apply 1 application topically 3 (three) times daily as needed. May also use over the counter Calimine for itch., Disp: 50 g, Rfl: 0 .  lisinopril-hydrochlorothiazide (PRINZIDE,ZESTORETIC) 10-12.5 MG tablet, Take 1 tablet by mouth daily., Disp: , Rfl:  .  lovastatin (MEVACOR) 20 MG tablet, Take 20 mg by mouth at bedtime., Disp: , Rfl:  .  Lysine 1000 MG TABS, Take by mouth as needed., Disp: , Rfl:  .  Magnesium Oxide 500 MG TABS, Take 1 tablet by mouth daily with breakfast., Disp: , Rfl:  .  montelukast (SINGULAIR) 10 MG tablet, Take 1 tablet (10 mg total) by mouth at bedtime., Disp: 30 tablet, Rfl: 3 .  Multiple Vitamin (MULTIVITAMIN) tablet, Take 1 tablet by mouth daily., Disp: , Rfl:  .  Omega-3 Fatty Acids (FISH OIL) 1200 MG CAPS, Take by mouth daily., Disp: , Rfl:  .  omeprazole-sodium bicarbonate (ZEGERID) 40-1100 MG capsule, Take 1 capsule by mouth daily before breakfast., Disp: , Rfl:  .   prednisoLONE acetate (PRED FORTE) 1 % ophthalmic suspension, Place 1 drop into the right eye 3 (three) times daily., Disp: , Rfl: 0 .  Probiotic Product (HEALTHY COLON PO), Take by mouth daily., Disp: , Rfl:  .  raloxifene (EVISTA) 60 MG tablet, Take 60 mg by mouth daily., Disp: , Rfl:  .  Red Yeast Rice 600 MG TABS, Take 1,200 mg by mouth daily., Disp: , Rfl:  .  RESTASIS MULTIDOSE 0.05 % ophthalmic emulsion, , Disp: , Rfl: 3 .  sodium chloride (OCEAN) 0.65 % SOLN nasal spray, Place 2 sprays into both nostrils every 2 (two) hours while awake., Disp: , Rfl: 0 .  vitamin C (ASCORBIC ACID) 500 MG tablet, Take 500 mg by mouth daily., Disp: , Rfl:  .  vitamin E 400 UNIT capsule, Take 400 Units by mouth daily., Disp: , Rfl:     Review of Systems  Constitutional: Positive for activity change (resting more - she worked yesterday but then started feeling worse. ), appetite change (trying to eat and has had cereal today- she is drinking plenty of water. ), chills, fatigue and fever. Negative for diaphoresis and unexpected weight change.  HENT: Positive for congestion (clear nasal congestion ), ear pain, sinus pain, sinus pressure (points to bilateral cheeks ) and sore throat (was sore wednesday night - now scratchy). Negative for dental problem, drooling, ear discharge, facial swelling, hearing loss, mouth sores, nosebleeds, postnasal drip, rhinorrhea, tinnitus, trouble swallowing and voice change.   Eyes: Negative.   Respiratory: Positive for cough (inetrmittent with mucous running down throat. Non productive  patinet states  " mild "cough ). Negative for apnea, choking, chest tightness, shortness of breath, wheezing and stridor.   Cardiovascular: Negative for chest pain, palpitations and leg swelling.  Gastrointestinal: Positive for diarrhea. Negative for abdominal distention, abdominal pain, anal bleeding, blood in stool, constipation, nausea, rectal pain and vomiting.  Genitourinary: Positive for  decreased urine volume (mildly decreased she reports she has noticed slight decrease. She is drinking but not as much as usual). Negative for difficulty urinating, dyspareunia, dysuria, enuresis, flank pain, frequency, genital sores, hematuria, menstrual problem, pelvic pain, urgency, vaginal bleeding, vaginal discharge and vaginal pain.  Musculoskeletal: Positive for myalgias (body aches ). Negative for arthralgias, back pain, gait problem, joint swelling, neck pain and neck stiffness.  Skin: Negative.   Neurological: Negative.   Hematological: Negative.   Psychiatric/Behavioral: Negative.        Objective:   Physical Exam  Constitutional: She is oriented to person, place, and time. She appears well-developed and well-nourished. She is active.  Non-toxic appearance. She does not have a sickly appearance. She does not appear ill. No distress. She is not intubated.  HENT:  Head: Normocephalic and atraumatic.  Right Ear: Hearing normal. There is tenderness (mild with exam). No drainage or swelling. Tympanic membrane is erythematous. Tympanic membrane is not injected, not perforated, not retracted and not bulging.  Left Ear: Hearing, tympanic membrane, external ear and ear canal normal. No lacerations. No drainage, swelling or tenderness. No foreign bodies. No mastoid tenderness. Tympanic membrane is not injected, not scarred, not perforated, not erythematous, not retracted and not bulging.  No middle ear effusion. No decreased hearing is noted.  Nose: Mucosal edema, rhinorrhea and sinus tenderness (" feels tender inside from blowing my nose") present. No nasal deformity, septal deviation or nasal septal hematoma. No epistaxis.  No foreign bodies. Right sinus exhibits maxillary sinus tenderness. Right sinus exhibits no frontal sinus tenderness. Left sinus exhibits maxillary sinus tenderness. Left sinus exhibits no frontal sinus tenderness.  Mouth/Throat: Uvula is midline and mucous membranes are  normal. Mucous membranes are not pale, not dry and not cyanotic. Posterior oropharyngeal erythema present. No posterior oropharyngeal edema.  Eyes: Pupils are equal, round, and reactive to light. Conjunctivae, EOM and lids are normal.  Neck: Trachea normal, normal range of motion, full passive range of motion without pain and phonation normal. Neck supple. Normal carotid pulses, no hepatojugular reflux and no JVD present. No tracheal tenderness, no spinous process tenderness and no muscular tenderness present. Carotid bruit is not present. No neck rigidity. No tracheal deviation, no edema, no erythema and normal range of motion present. No Brudzinski's sign and no Kernig's sign noted. No thyroid mass and no thyromegaly present.  Cardiovascular: Normal rate, regular rhythm, normal heart sounds and intact distal pulses.  Exam reveals no gallop and no friction rub.   No murmur heard. Pulmonary/Chest: Effort normal and breath sounds normal. No accessory muscle usage or stridor. No apnea, no tachypnea and no bradypnea. She is not intubated. No respiratory distress. She has no wheezes. She has no rales. She exhibits no tenderness.  Abdominal: Soft. Normal aorta and bowel sounds are normal. She exhibits no shifting dullness, no distension, no pulsatile liver, no fluid wave, no abdominal bruit, no ascites, no pulsatile midline mass and no mass. There is no hepatosplenomegaly, splenomegaly or hepatomegaly. There is no tenderness. There is no rigidity, no rebound, no guarding, no CVA tenderness, no tenderness at McBurney's point and negative Murphy's sign.  Musculoskeletal: Normal range of motion. She exhibits no edema, tenderness or deformity.  Patient moves on and off of exam table and in room without difficulty. Gait is normal in hall and in room. Patient is oriented to person place time and situation. Patient answers questions appropriately and engages in conversation.   Lymphadenopathy:       Head (right  side): Tonsillar (mild ) adenopathy present. No submental, no submandibular, no preauricular, no posterior auricular and no occipital adenopathy present.       Head (left side): Tonsillar (mild ) adenopathy present. No submental, no submandibular, no preauricular, no posterior auricular and no occipital adenopathy present.    She has no cervical adenopathy.       Right cervical: No superficial cervical, no deep cervical and no posterior cervical adenopathy present.      Left cervical: No superficial cervical, no deep cervical and no posterior cervical adenopathy present.  Neurological: She is alert and oriented to person, place, and time. She has normal strength and normal reflexes. She displays no tremor and normal reflexes. No cranial nerve deficit. She exhibits normal muscle tone. She displays a negative Romberg sign. She displays no seizure activity. Coordination  normal. GCS eye subscore is 4. GCS verbal subscore is 5. GCS motor subscore is 6.  Patient moves on and off of exam table and in room without difficulty. Gait is normal in hall and in room. Patient is oriented to person place time and situation. Patient answers questions appropriately and engages in conversation.   Skin: Skin is warm, dry and intact. No rash noted. She is not diaphoretic. No cyanosis. No pallor. Nails show no clubbing.  Psychiatric: She has a normal mood and affect. Her speech is normal and behavior is normal. Judgment and thought content normal. Cognition and memory are normal.   She denies any CVA tenderness bilaterally.   Recent Results (from the past 2160 hour(s))  0 - 32 IU/L  POCT urinalysis dipstick     Status: Abnormal   Collection Time: 07/03/17  2:29 PM  Result Value Ref Range   Color, UA Yellow    Clarity, UA Clear    Glucose, UA Negative    Bilirubin, UA     Ketones, UA Positive    Spec Grav, UA 1.010 1.010 - 1.025   Blood, UA Negative    pH, UA 6.5 5.0 - 8.0   Protein, UA trace    Urobilinogen, UA   0.2 or 1.0 E.U./dL   Nitrite, UA Negative    Leukocytes, UA Moderate (2+) (A) Negative  POCT Influenza A/B     Status: Normal   Collection Time: 07/03/17  2:29 PM  Result Value Ref Range   Influenza A, POC  Negative   Influenza B, POC  Negative     Above results of urine PCOT in office and Flu test for A and B negative. Other labs are from august strike through.  Assessment:    Fever, unspecified fever cause - Plan: CBC w/Diff, POCT urinalysis dipstick, Urine Culture, POCT Influenza A/B  Right otitis media, unspecified otitis media type  Abnormal finding in urine       Plan:     Meds ordered this encounter  Medications  . levofloxacin (LEVAQUIN) 500 MG tablet    Sig: Take 1 tablet (500 mg total) by mouth daily.    Dispense:  7 tablet    Refill:  0   Orders Placed This Encounter  Procedures  . Urine Culture    CALL [phone]  . CBC w/Diff  . POCT urinalysis dipstick  . POCT Influenza A/B     Patient is advised to stop her Citalopram for five days. She is advised of risk of QT prolongation with Citalopram and Levaquin. She reports she did not take her Citalopram today.   Patient is advised to rest, hydrate, try to continue to eat soups and foods that taste well to her.  Any temperature over 104 go to the Emergency room for treatment.   Return to clinic at any time  if any new symptoms change, worsen or do not improve. Symptoms should improve  within 72 hours and if not improving you should call for an appointment at the clinic or be seen in urgent care/ED if clinic is closed. Your symptoms should not get worse from this point forward and if they do seek immediate medical attention.If you worsen over weekend seek immediate medical care.   Patient verbalized understanding of instructions and denies any further questions at this time.   Discussed above plan with supervising physician Dr. Miguel Aschoff who is in agreement with the care plan as above.

## 2017-07-05 LAB — URINE CULTURE

## 2017-07-06 ENCOUNTER — Telehealth: Payer: Self-pay | Admitting: Medical

## 2017-07-06 ENCOUNTER — Telehealth: Payer: Self-pay | Admitting: Adult Health

## 2017-07-06 NOTE — Telephone Encounter (Signed)
9/29 called patient with lab results showing elevated white blood cells as below. Patient states " I am feeling better and think my fever went away this morning about 4 am". She is to continue Levaquin as prescribed and go to the emergency room as directed if any symptoms worsen or temperature ober 104 degrees F.  Patient verbalized understanding of instructions and denies any further questions at this time.  Urine culture is pending at this time.    Collection Time: 07/03/17  2:05 PM  Result Value Ref Range   WBC 11.6 (H) 3.4 - 10.8 x10E3/uL   RBC 4.17 3.77 - 5.28 x10E6/uL   Hemoglobin 12.9 11.1 - 15.9 g/dL   Hematocrit 38.1 34.0 - 46.6 %   MCV 91 79 - 97 fL   MCH 30.9 26.6 - 33.0 pg   MCHC 33.9 31.5 - 35.7 g/dL   RDW 12.9 12.3 - 15.4 %   Platelets 167 150 - 379 x10E3/uL   Neutrophils 88 Not Estab. %   Lymphs 5 Not Estab. %   Monocytes 7 Not Estab. %   Eos 0 Not Estab. %   Basos 0 Not Estab. %   Neutrophils Absolute 10.1 (H) 1.4 - 7.0 x10E3/uL   Lymphocytes Absolute 0.5 (L) 0.7 - 3.1 x10E3/uL   Monocytes Absolute 0.9 0.1 - 0.9 x10E3/uL   EOS (ABSOLUTE) 0.0 0.0 - 0.4 x10E3/uL   Basophils Absolute 0.0 0.0 - 0.2 x10E3/uL   Immature Granulocytes 0 Not Estab. %   Immature Grans (Abs) 0.0 0.0 - 0.1 x10E3/uL  Urine Culture     Status: None   Collection Time: 07/03/17  2:21 PM  Result Value Ref Range   Urine Culture, Routine Final report    Organism ID, Bacteria Comment     Comment: Mixed urogenital flora Less than 10,000 colonies/mL   POCT urinalysis dipstick     Status: Abnormal   Collection Time: 07/03/17  2:29 PM  Result Value Ref Range   Color, UA Yellow    Clarity, UA Clear    Glucose, UA Negative    Bilirubin, UA     Ketones, UA Positive    Spec Grav, UA 1.010 1.010 - 1.025   Blood, UA Negative    pH, UA 6.5 5.0 - 8.0   Protein, UA trace    Urobilinogen, UA  0.2 or 1.0 E.U./dL   Nitrite, UA Negative    Leukocytes, UA Moderate (2+) (A) Negative  POCT Influenza A/B      Status: Normal   Collection Time: 07/03/17  2:29 PM  Result Value Ref Range   Influenza A, POC  Negative   Influenza B, POC  Negative

## 2017-07-06 NOTE — Telephone Encounter (Signed)
Called patient, she says she is feeling better, though woke up sweating this morning. But over all she is feeling better. Taking today off , says she had a rough weekend.  Urine culture was negative. Says she is drinking plenty of fluids.Return to clinic as needed.

## 2017-07-08 ENCOUNTER — Telehealth: Payer: Self-pay

## 2017-07-08 NOTE — Telephone Encounter (Signed)
Patient requests cough syrup but since she is having cataract surgery tomorrow our provider referred her to the physician doing that surgery for an order.

## 2017-07-09 DIAGNOSIS — H524 Presbyopia: Secondary | ICD-10-CM | POA: Diagnosis not present

## 2017-07-09 DIAGNOSIS — H2512 Age-related nuclear cataract, left eye: Secondary | ICD-10-CM | POA: Diagnosis not present

## 2017-07-16 ENCOUNTER — Ambulatory Visit: Payer: Self-pay | Admitting: Adult Health

## 2017-07-16 VITALS — BP 130/79 | HR 87 | Temp 98.0°F | Resp 16 | Wt 152.0 lb

## 2017-07-16 DIAGNOSIS — R82998 Other abnormal findings in urine: Secondary | ICD-10-CM

## 2017-07-16 DIAGNOSIS — J0191 Acute recurrent sinusitis, unspecified: Secondary | ICD-10-CM

## 2017-07-16 DIAGNOSIS — Z0000 Encounter for general adult medical examination without abnormal findings: Secondary | ICD-10-CM

## 2017-07-16 LAB — POCT URINALYSIS DIPSTICK
BILIRUBIN UA: NEGATIVE
Blood, UA: NEGATIVE
GLUCOSE UA: NEGATIVE
Ketones, UA: NEGATIVE
NITRITE UA: NEGATIVE
Protein, UA: NEGATIVE
Spec Grav, UA: 1.01 (ref 1.010–1.025)
Urobilinogen, UA: 1 E.U./dL
pH, UA: 6 (ref 5.0–8.0)

## 2017-07-16 MED ORDER — AZITHROMYCIN 250 MG PO TABS: ORAL_TABLET | ORAL | 0 refills | Status: DC

## 2017-07-16 NOTE — Progress Notes (Signed)
Subjective:     Patient ID: Jane Riley, female   DOB: [birth year], 66 y.o.   MRN: [id number]  HPI  Patient is a 66 year old female in no acute distress who was seen on 07/03/17 and treated for possible urinary tract infection and  She completed Levaquin for five days and reports her symptoms resolved and she was able to have her left eye cataract surgery done 07/09/17.  She reports she was much improved in her chest congestion but feels like she now has sinus infection and her nasal congestion has remained. She has yellow nasal discharge. Sinus pressure worse with leaning forward.  Denies any fevers at home since 07/05/17.  She is afebrile in office.  She denies any chest pain, shortness of breath, nausea, vomiting or diarrhea. She reports her energy level has improved.  She denies any fever,  rash, chest pain, shortness of breath, nausea, vomiting, or diarrhea    Vitals:   07/16/17 0829  BP: 130/79  Pulse: 87  Resp: 16  Temp: 98 F (36.7 C)  SpO2: 97%     Patient Active Problem List   Diagnosis Date Noted  . Nuclear sclerotic cataract of left eye 06/25/2017  . Presbyopia 06/25/2017  . Shingles 05/28/2017  . Anxiety 05/20/2017  . Essential hypertension 05/20/2017  . Gastroesophageal reflux disease without esophagitis 05/20/2017  . Mixed hyperlipidemia 05/20/2017  . Allergic rhinitis 12/15/2016  . Vaccine counseling 01/07/2016    Current Outpatient Prescriptions:  .  Biotin 5000 MCG TABS, Take by mouth daily., Disp: , Rfl:  .  Calcium Carbonate-Vitamin D (CALCIUM 500/D PO), Take by mouth daily., Disp: , Rfl:  .  Cholecalciferol (VITAMIN D3) 1000 units CAPS, Take by mouth daily., Disp: , Rfl:  .  citalopram (CELEXA) 20 MG tablet, Take 20 mg by mouth daily., Disp: , Rfl:  .  Coenzyme Q10 (COQ10) 200 MG CAPS, Take by mouth daily., Disp: , Rfl:  .  DHA-EPA-Flaxseed Oil-Vitamin E (THERATEARS NUTRITION) CAPS, Take 3 capsules by mouth daily before breakfast., Disp: , Rfl:  .   DICLOFENAC POTASSIUM PO, Take 50 mg by mouth daily., Disp: , Rfl:  .  lisinopril-hydrochlorothiazide (PRINZIDE,ZESTORETIC) 10-12.5 MG tablet, Take 1 tablet by mouth daily., Disp: , Rfl:  .  lovastatin (MEVACOR) 20 MG tablet, Take 20 mg by mouth at bedtime., Disp: , Rfl:  .  Lysine 1000 MG TABS, Take by mouth as needed., Disp: , Rfl:  .  Magnesium Oxide 500 MG TABS, Take 1 tablet by mouth daily with breakfast., Disp: , Rfl:  .  montelukast (SINGULAIR) 10 MG tablet, Take 1 tablet (10 mg total) by mouth at bedtime., Disp: 30 tablet, Rfl: 3 .  Multiple Vitamin (MULTIVITAMIN) tablet, Take 1 tablet by mouth daily., Disp: , Rfl:  .  omeprazole-sodium bicarbonate (ZEGERID) 40-1100 MG capsule, Take 1 capsule by mouth daily before breakfast., Disp: , Rfl:  .  prednisoLONE acetate (PRED FORTE) 1 % ophthalmic suspension, Place 1 drop into the right eye 3 (three) times daily., Disp: , Rfl: 0 .  Probiotic Product (HEALTHY COLON PO), Take by mouth daily., Disp: , Rfl:  .  raloxifene (EVISTA) 60 MG tablet, Take 60 mg by mouth daily., Disp: , Rfl:  .  Red Yeast Rice 600 MG TABS, Take 1,200 mg by mouth daily., Disp: , Rfl:  .  RESTASIS MULTIDOSE 0.05 % ophthalmic emulsion, , Disp: , Rfl: 3 .  vitamin C (ASCORBIC ACID) 500 MG tablet, Take 500 mg by mouth daily., Disp: ,  Rfl:  .  vitamin E 400 UNIT capsule, Take 400 Units by mouth daily., Disp: , Rfl:  .  fluticasone (FLONASE) 50 MCG/ACT nasal spray, Place 1 spray into both nostrils 2 (two) times daily. (Patient not taking: Reported on 07/03/2017), Disp: 16 g, Rfl: 11 .  levocetirizine (XYZAL) 5 MG tablet, Take 5 mg by mouth every evening., Disp: , Rfl:  .  lidocaine (XYLOCAINE) 5 % ointment, Apply 1 application topically 3 (three) times daily as needed. May also use over the counter Calimine for itch. (Patient not taking: Reported on 07/03/2017), Disp: 50 g, Rfl: 0 .  sodium chloride (OCEAN) 0.65 % SOLN nasal spray, Place 2 sprays into both nostrils every 2 (two) hours  while awake., Disp: , Rfl: 0   Review of Systems  Constitutional: Negative.   HENT: Positive for postnasal drip, rhinorrhea and sinus pressure (around bilateral eyes and maxillary). Negative for congestion, dental problem, drooling, ear discharge, ear pain, facial swelling, hearing loss, mouth sores, nosebleeds, sinus pain, sneezing, sore throat, tinnitus, trouble swallowing and voice change.   Eyes: Negative.   Respiratory: Positive for cough (intermittent cough patient reports post nasal drip continuous). Negative for apnea, choking, chest tightness, shortness of breath, wheezing and stridor.   Cardiovascular: Negative for chest pain, palpitations and leg swelling.  Gastrointestinal: Negative.   Endocrine: Negative.   Genitourinary: Negative.   Musculoskeletal: Negative.   Skin: Negative.   Allergic/Immunologic: Negative.   Neurological: Negative.   Hematological: Negative.   Psychiatric/Behavioral: Negative.        Objective:   Physical Exam  Constitutional: She is oriented to person, place, and time. Vital signs are normal. She appears well-developed and well-nourished. No distress.  HENT:  Head: Atraumatic.  Right Ear: Hearing, external ear and ear canal normal. A middle ear effusion is present.  Left Ear: Hearing, external ear and ear canal normal. A middle ear effusion is present.  Nose: Mucosal edema and rhinorrhea present. Right sinus exhibits maxillary sinus tenderness. Right sinus exhibits no frontal sinus tenderness. Left sinus exhibits maxillary sinus tenderness. Left sinus exhibits no frontal sinus tenderness.  Mouth/Throat: Uvula is midline and mucous membranes are normal. Posterior oropharyngeal erythema (mild ) present.    Eyes: Pupils are equal, round, and reactive to light. Conjunctivae and EOM are normal. Right eye exhibits no discharge. Left eye exhibits no discharge. No scleral icterus.  Neck: Normal range of motion. Neck supple. No JVD present. No tracheal  deviation present. No thyromegaly present.  Cardiovascular: Normal rate, regular rhythm, normal heart sounds and intact distal pulses.  Exam reveals no gallop and no friction rub.   No murmur heard. Pulmonary/Chest: Effort normal and breath sounds normal. No stridor. No respiratory distress. She has no wheezes. She has no rales. She exhibits no tenderness.  Abdominal: Soft. Bowel sounds are normal. She exhibits no distension and no mass. There is no tenderness. There is no rebound and no guarding.  Musculoskeletal: Normal range of motion. She exhibits no edema, tenderness or deformity.  Patient moves on and off of exam table and in room without difficulty. Gait is normal in hall and in room. Patient is oriented to person place time and situation. Patient answers questions appropriately and engages in conversation.   Lymphadenopathy:       Head (right side): Submandibular (mild ) adenopathy present. No submental, no tonsillar, no preauricular, no posterior auricular and no occipital adenopathy present.       Head (left side): Submandibular (mild ) adenopathy present. No  submental, no tonsillar, no preauricular, no posterior auricular and no occipital adenopathy present.    She has no cervical adenopathy.  Neurological: She is alert and oriented to person, place, and time. She has normal reflexes. She displays normal reflexes. No cranial nerve deficit. She exhibits normal muscle tone. Coordination normal.  Skin: Skin is warm and dry. No rash noted. She is not diaphoretic. No erythema. No pallor.  Psychiatric: She has a normal mood and affect. Her behavior is normal. Judgment and thought content normal.  Vitals reviewed.      Assessment:     Routine check-up - Plan: POCT urinalysis dipstick  Leukocytes in urine - Plan: CULTURE, URINE COMPREHENSIVE  Acute recurrent sinusitis, unspecified location      Plan:       Patient called and advised to hold Celexa for the five days while taking  Azythromycin.  She is advised to use nasal saline spray and remain on her allergy medications.   Discussed need for patient to be evaluated by ENT and provider will make refferal, she reports she has seen ENT Dr. Virgia Land last year 2017 and had a  CT Scan of sinuses which is reported as clear. She also reports she has seen by Dr. Raul Del and reports to provider that her testing was all normal. She also has a Dion Body, MD She is instructed to follow up with PCP as well for a follow up visit.    She will return to this clinic on 07/20/17 for recheck and needs to follow up as above.   Meds ordered this encounter  Medications  . azithromycin (ZITHROMAX) 250 MG tablet    Sig: Take two tablets ( 500 mg ) day one and then take one tablet (250mg  ) day two three four and five.    Dispense:  6 tablet    Refill:  0   Return to clinic at any time  if any new symptoms change, worsen or do not improve. Symptoms should improve  within 72 hours and if not improving you should call for an appointment at the clinic or be seen in urgent care/ED if clinic is closed. Your symptoms should not get worse from this point forward and if they do seek immediate medical attention.  Patient verbalized understanding of instructions and denies any further questions at this time.

## 2017-07-19 NOTE — Patient Instructions (Signed)

## 2017-07-20 ENCOUNTER — Ambulatory Visit: Payer: Self-pay | Admitting: Medical

## 2017-07-20 LAB — CULTURE, URINE COMPREHENSIVE

## 2017-08-04 DIAGNOSIS — I10 Essential (primary) hypertension: Secondary | ICD-10-CM | POA: Diagnosis not present

## 2017-08-04 DIAGNOSIS — E782 Mixed hyperlipidemia: Secondary | ICD-10-CM | POA: Diagnosis not present

## 2017-08-11 DIAGNOSIS — Z23 Encounter for immunization: Secondary | ICD-10-CM | POA: Diagnosis not present

## 2017-08-11 DIAGNOSIS — I10 Essential (primary) hypertension: Secondary | ICD-10-CM | POA: Diagnosis not present

## 2017-08-11 DIAGNOSIS — F419 Anxiety disorder, unspecified: Secondary | ICD-10-CM | POA: Diagnosis not present

## 2017-08-11 DIAGNOSIS — E782 Mixed hyperlipidemia: Secondary | ICD-10-CM | POA: Diagnosis not present

## 2017-08-14 DIAGNOSIS — Z23 Encounter for immunization: Secondary | ICD-10-CM | POA: Diagnosis not present

## 2017-09-15 DIAGNOSIS — Z961 Presence of intraocular lens: Secondary | ICD-10-CM | POA: Diagnosis not present

## 2017-10-07 ENCOUNTER — Telehealth: Payer: Self-pay | Admitting: Medical

## 2017-10-07 MED ORDER — MONTELUKAST SODIUM 10 MG PO TABS: 10.0000 mg | ORAL_TABLET | Freq: Every day | ORAL | 0 refills | Status: DC

## 2017-10-07 MED ORDER — LEVOCETIRIZINE DIHYDROCHLORIDE 5 MG PO TABS: 5.0000 mg | ORAL_TABLET | Freq: Every evening | ORAL | 0 refills | Status: AC

## 2017-10-07 NOTE — Telephone Encounter (Signed)
Patient needed refill on Xyzal and Singulair till she could see her primary doctor.

## 2017-11-06 ENCOUNTER — Other Ambulatory Visit: Payer: Self-pay | Admitting: Medical

## 2017-11-06 DIAGNOSIS — L820 Inflamed seborrheic keratosis: Secondary | ICD-10-CM | POA: Diagnosis not present

## 2017-11-06 DIAGNOSIS — D1800 Hemangioma unspecified site: Secondary | ICD-10-CM | POA: Diagnosis not present

## 2017-11-06 DIAGNOSIS — L814 Other melanin hyperpigmentation: Secondary | ICD-10-CM | POA: Diagnosis not present

## 2017-12-02 DIAGNOSIS — E782 Mixed hyperlipidemia: Secondary | ICD-10-CM | POA: Diagnosis not present

## 2017-12-02 DIAGNOSIS — I10 Essential (primary) hypertension: Secondary | ICD-10-CM | POA: Diagnosis not present

## 2017-12-09 DIAGNOSIS — J309 Allergic rhinitis, unspecified: Secondary | ICD-10-CM | POA: Diagnosis not present

## 2017-12-09 DIAGNOSIS — E782 Mixed hyperlipidemia: Secondary | ICD-10-CM | POA: Diagnosis not present

## 2017-12-09 DIAGNOSIS — I10 Essential (primary) hypertension: Secondary | ICD-10-CM | POA: Diagnosis not present

## 2017-12-09 DIAGNOSIS — F419 Anxiety disorder, unspecified: Secondary | ICD-10-CM | POA: Diagnosis not present

## 2017-12-15 ENCOUNTER — Other Ambulatory Visit: Payer: Self-pay | Admitting: Family Medicine

## 2017-12-15 DIAGNOSIS — Z1231 Encounter for screening mammogram for malignant neoplasm of breast: Secondary | ICD-10-CM

## 2017-12-21 DIAGNOSIS — M5412 Radiculopathy, cervical region: Secondary | ICD-10-CM | POA: Diagnosis not present

## 2017-12-22 DIAGNOSIS — L820 Inflamed seborrheic keratosis: Secondary | ICD-10-CM | POA: Diagnosis not present

## 2017-12-22 DIAGNOSIS — D225 Melanocytic nevi of trunk: Secondary | ICD-10-CM | POA: Diagnosis not present

## 2017-12-22 DIAGNOSIS — Z1283 Encounter for screening for malignant neoplasm of skin: Secondary | ICD-10-CM | POA: Diagnosis not present

## 2017-12-22 DIAGNOSIS — D485 Neoplasm of uncertain behavior of skin: Secondary | ICD-10-CM | POA: Diagnosis not present

## 2017-12-22 DIAGNOSIS — L308 Other specified dermatitis: Secondary | ICD-10-CM | POA: Diagnosis not present

## 2017-12-22 DIAGNOSIS — D229 Melanocytic nevi, unspecified: Secondary | ICD-10-CM | POA: Diagnosis not present

## 2017-12-22 DIAGNOSIS — D1800 Hemangioma unspecified site: Secondary | ICD-10-CM | POA: Diagnosis not present

## 2018-01-27 DIAGNOSIS — M5412 Radiculopathy, cervical region: Secondary | ICD-10-CM | POA: Diagnosis not present

## 2018-01-29 DIAGNOSIS — L820 Inflamed seborrheic keratosis: Secondary | ICD-10-CM | POA: Diagnosis not present

## 2018-01-29 DIAGNOSIS — L710 Perioral dermatitis: Secondary | ICD-10-CM | POA: Diagnosis not present

## 2018-02-01 ENCOUNTER — Ambulatory Visit
Admission: RE | Admit: 2018-02-01 | Discharge: 2018-02-01 | Disposition: A | Discharge status: Home / Self Care | Payer: BLUE CROSS/BLUE SHIELD | Source: Ambulatory Visit | Attending: Family Medicine | Admitting: Family Medicine

## 2018-02-01 DIAGNOSIS — Z1231 Encounter for screening mammogram for malignant neoplasm of breast: Secondary | ICD-10-CM | POA: Diagnosis not present

## 2018-03-02 DIAGNOSIS — S60861A Insect bite (nonvenomous) of right wrist, initial encounter: Secondary | ICD-10-CM | POA: Diagnosis not present

## 2018-03-02 DIAGNOSIS — L508 Other urticaria: Secondary | ICD-10-CM | POA: Diagnosis not present

## 2018-03-02 DIAGNOSIS — L308 Other specified dermatitis: Secondary | ICD-10-CM | POA: Diagnosis not present

## 2018-03-02 DIAGNOSIS — D485 Neoplasm of uncertain behavior of skin: Secondary | ICD-10-CM | POA: Diagnosis not present

## 2018-03-02 DIAGNOSIS — L820 Inflamed seborrheic keratosis: Secondary | ICD-10-CM | POA: Diagnosis not present

## 2018-03-02 DIAGNOSIS — L739 Follicular disorder, unspecified: Secondary | ICD-10-CM | POA: Diagnosis not present

## 2018-03-02 DIAGNOSIS — L710 Perioral dermatitis: Secondary | ICD-10-CM | POA: Diagnosis not present

## 2018-03-05 DIAGNOSIS — K219 Gastro-esophageal reflux disease without esophagitis: Secondary | ICD-10-CM | POA: Diagnosis not present

## 2018-03-05 DIAGNOSIS — K209 Esophagitis, unspecified: Secondary | ICD-10-CM | POA: Diagnosis not present

## 2018-03-05 DIAGNOSIS — T368X5A Adverse effect of other systemic antibiotics, initial encounter: Secondary | ICD-10-CM | POA: Diagnosis not present

## 2018-03-08 ENCOUNTER — Encounter: Payer: Self-pay | Admitting: Gastroenterology

## 2018-03-08 ENCOUNTER — Ambulatory Visit: Payer: BLUE CROSS/BLUE SHIELD | Admitting: Gastroenterology

## 2018-03-08 ENCOUNTER — Other Ambulatory Visit: Payer: Self-pay

## 2018-03-08 VITALS — BP 126/76 | HR 65 | Temp 98.1°F | Wt 154.2 lb

## 2018-03-08 DIAGNOSIS — R1310 Dysphagia, unspecified: Secondary | ICD-10-CM

## 2018-03-08 DIAGNOSIS — R1319 Other dysphagia: Secondary | ICD-10-CM

## 2018-03-08 NOTE — Addendum Note (Signed)
Addended by: Earl Lagos on: 03/08/2018 03:28 PM   Modules accepted: Orders

## 2018-03-08 NOTE — Progress Notes (Signed)
Jane Riley [address]  [place]  [place], [place] [address]  Main: [phone]  Fax: [phone]   Gastroenterology Consultation  Referring Provider:     Dion Body, MD Primary Care Physician:  Dion Body, MD Primary Gastroenterologist:  Dr. Vonda Riley Reason for Consultation:     pill induced esophagitis        HPI:    Chief Complaint  Patient presents with  . Establish Care    esophagitis-? R/T Doxycycline    Jane Riley is a 67 y.o. y/o female referred for consultation & management  by Dr. Dion Body, MD.  Patient was started on doxycycline on April 26 by dermatology.  About 1-1/2 weeks ago, she started noticing dysphagia and burning with swallowing.  She has started taking her Zegerid twice a day over the last week, and this has not helped symptoms.  Food is able to go down, and she has not had any food impactions.  She was evaluated by ENT, and states she had laryngoscopy, and the concern was for pill induced esophagitis, and they requested that she follow-up with GI.  I do not have the clinic or her endoscopy reports from ENT at this time.    No recent EGDs.  Provation shows an EGD done in 2008 by Dr. Vira Agar that reports normal esophagus.  Gastritis.  Patient states she recently had a colonoscopy by Dr. Vira Agar in 2018, and polyps were removed.  Colonoscopy report is not available.  States she has been having one every 5 years with Dr. Vira Agar.  No altered bowel habits, weight loss, blood in stool.  Past Medical History:  Diagnosis Date  . Arthritis    neck, hands  . CRPS (complex regional pain syndrome)   . Depression   . GERD (gastroesophageal reflux disease)   . Hypercholesteremia   . Hypertension   . Osteoporosis   . PONV (postoperative nausea and vomiting)     Past Surgical History:  Procedure Laterality Date  . ABDOMINAL HYSTERECTOMY    . CATARACT EXTRACTION W/PHACO Right 01/12/2017   Procedure: CATARACT  EXTRACTION PHACO AND INTRAOCULAR LENS PLACEMENT (IOC) Right Restor Lens;  Surgeon: Ronnell Freshwater, MD;  Location: [facility];  Service: Ophthalmology;  Laterality: Right;  Restor Lens  . CESAREAN SECTION     x2  . CHOLECYSTECTOMY    . NECK SURGERY     4 level fusion  . TONSILLECTOMY      Prior to Admission medications   Medication Sig Start Date End Date Taking? Authorizing Provider  Biotin 5000 MCG TABS Take by mouth daily.   Yes [provider]  Cholecalciferol (VITAMIN D3) 1000 units CAPS Take by mouth daily.   Yes [provider]  citalopram (CELEXA) 20 MG tablet Take 20 mg by mouth daily.   Yes [provider]  Coenzyme Q10 (COQ10) 200 MG CAPS Take by mouth daily.   Yes [provider]  DICLOFENAC POTASSIUM PO Take 50 mg by mouth daily.   Yes [provider]  doxycycline (VIBRAMYCIN) 100 MG capsule TAKE 1 CAPSULE BY MOUTH TWICE A DAY AS DIRECTED 01/29/18  Yes [provider]  levocetirizine (XYZAL) 5 MG tablet Take 1 tablet (5 mg total) by mouth every evening. 10/07/17  Yes Ratcliffe, Heather R, PA-C  lisinopril-hydrochlorothiazide (PRINZIDE,ZESTORETIC) 10-12.5 MG tablet Take 1 tablet by mouth daily.   Yes [provider]  lovastatin (MEVACOR) 20 MG tablet Take 20 mg by mouth at bedtime.  Yes [provider]  Lysine 1000 MG TABS Take by mouth as needed.   Yes [provider]  Magnesium Oxide 500 MG TABS Take 1 tablet by mouth daily with breakfast.   Yes [provider]  Multiple Vitamin (MULTIVITAMIN) tablet Take 1 tablet by mouth daily.   Yes [provider]  Omega-3 Fatty Acids (KP FISH OIL) 1200 MG CAPS Take by mouth.   Yes [provider]  omeprazole-sodium bicarbonate (ZEGERID) 40-1100 MG capsule Take 1 capsule by mouth daily before breakfast.   Yes [provider]  Probiotic Product (HEALTHY COLON PO) Take by mouth daily.   Yes [provider]  raloxifene (EVISTA) 60 MG tablet Take 60 mg by mouth daily.   Yes [provider]  Red Yeast Rice 600 MG TABS Take 1,200 mg by mouth daily.   Yes [provider]  vitamin C (ASCORBIC ACID) 500 MG tablet Take 500 mg by mouth daily.   Yes [provider]  vitamin E 400 UNIT capsule Take 400 Units by mouth daily.   Yes [provider]  sodium chloride (OCEAN) 0.65 % SOLN nasal spray Place 2 sprays into both nostrils every 2 (two) hours while awake. 05/20/17 06/19/17  Betancourt, Aura Fey, NP    Family History  Problem Relation Age of Onset  . Breast cancer Cousin      Social History   Tobacco Use  . Smoking status: Former Smoker    Last attempt to quit: 10/06/2006    Years since quitting: 11.4  . Smokeless tobacco: Never Used  Substance Use Topics  . Alcohol use: Yes    Alcohol/week: 4.2 oz    Types: 7 Glasses of wine per week  . Drug use: Not on file    Allergies as of 03/08/2018 - Review Complete 03/08/2018  Allergen Reaction Noted  . Adhesive [tape]  01/05/2017  . Codeine Nausea Only and Nausea And Vomiting 02/20/2014  . Nsaids Other (See Comments) 02/20/2014  . Other Itching 02/20/2014  . Pravastatin Other (See Comments) 02/20/2014  . Propoxyphene Itching 02/20/2014  . Amoxicillin Rash 11/29/2014  . Penicillins Itching and Rash 11/05/2016    Review of Systems:    All systems reviewed and negative except where noted in HPI.   Physical Exam:  BP 126/76   Pulse 65   Temp 98.1 F (36.7 C) (Oral)  No LMP recorded. Patient has had a hysterectomy. Psych:  Alert and cooperative. Normal mood and affect. General:   Alert,  Well-developed, well-nourished, pleasant and cooperative in NAD Head:  Normocephalic and atraumatic. Eyes:  Sclera clear, no icterus.   Conjunctiva pink. Ears:  Normal auditory acuity. Nose:  No deformity, discharge, or lesions. Mouth:  No deformity or lesions,oropharynx pink & moist. Neck:  Supple; no masses or  thyromegaly. Lungs:  Respirations even and unlabored.  Clear throughout to auscultation.   No wheezes, crackles, or rhonchi. No acute distress. Heart:  Regular rate and rhythm; no murmurs, clicks, rubs, or gallops. Abdomen:  Normal bowel sounds.  No bruits.  Soft, non-tender and non-distended without masses, hepatosplenomegaly or hernias noted.  No guarding or rebound tenderness.    Msk:  Symmetrical without gross deformities. Good, equal movement & strength bilaterally. Pulses:  Normal pulses noted. Extremities:  No clubbing or edema.  No cyanosis. Neurologic:  Alert and oriented x3;  grossly normal neurologically. Skin:  Intact without significant lesions or rashes. No jaundice. Lymph Nodes:  No significant cervical adenopathy. Psych:  Alert and  cooperative. Normal mood and affect.   Labs: CBC    Component Value Date/Time   WBC 11.6 (H) 07/03/2017 1405   RBC 4.17 07/03/2017 1405   HGB 12.9 07/03/2017 1405   HCT 38.1 07/03/2017 1405   PLT 167 07/03/2017 1405   MCV 91 07/03/2017 1405   MCH 30.9 07/03/2017 1405   MCHC 33.9 07/03/2017 1405   RDW 12.9 07/03/2017 1405   LYMPHSABS 0.5 (L) 07/03/2017 1405   EOSABS 0.0 07/03/2017 1405   BASOSABS 0.0 07/03/2017 1405   CMP     Component Value Date/Time   NA 142 05/28/2017 1120   K 3.7 05/28/2017 1120   CL 100 05/28/2017 1120   CO2 23 05/28/2017 1120   GLUCOSE 108 (H) 05/28/2017 1120   BUN 13 05/28/2017 1120   CREATININE 0.76 05/28/2017 1120   CALCIUM 9.5 05/28/2017 1120   PROT 6.8 05/28/2017 1120   ALBUMIN 4.7 05/28/2017 1120   AST 28 05/28/2017 1120   ALT 19 05/28/2017 1120   ALKPHOS 53 05/28/2017 1120   BILITOT 0.3 05/28/2017 1120   GFRNONAA 83 05/28/2017 1120   GFRAA 96 05/28/2017 1120    Imaging Studies: No results found.  Assessment and Plan:   ARNETA MAHMOOD is a 67 y.o. y/o female has been referred for evaluation of possible pill induced esophagitis, after evaluation by ENT for burning, and difficulty while  swallowing over the last 1/2 weeks after taking doxycycline prescribed by dermatology  Doxycycline can cause pill-induced esophagitis She is already increase her PPI to twice daily but this has not helped Continue current dose of twice daily I have asked her to contact her dermatologist today, and inform them of her ongoing symptoms, and discuss if doxycycline can be stopped, as the rash that she started taking this for has resolved.  She states she will be contacting them today to discuss this as well.  We will plan on evaluating her esophagus with an EGD to rule out any strictures, Candida esophagitis, pill induced esophagitis, reflux esophagitis. I have discussed alternative options, risks & benefits,  which include, but are not limited to, bleeding, infection, perforation,respiratory complication & drug reaction.  The patient agrees with this plan & written consent will be obtained.      Dr Jane Riley

## 2018-03-08 NOTE — Patient Instructions (Signed)
F/U  3 months 

## 2018-03-10 ENCOUNTER — Ambulatory Visit: Payer: Self-pay | Admitting: Adult Health

## 2018-03-10 ENCOUNTER — Encounter: Payer: Self-pay | Admitting: Adult Health

## 2018-03-10 VITALS — BP 142/70 | HR 81 | Temp 98.2°F | Resp 20 | Wt 155.2 lb

## 2018-03-10 DIAGNOSIS — R9389 Abnormal findings on diagnostic imaging of other specified body structures: Secondary | ICD-10-CM

## 2018-03-10 DIAGNOSIS — R059 Cough, unspecified: Secondary | ICD-10-CM

## 2018-03-10 DIAGNOSIS — R05 Cough: Secondary | ICD-10-CM

## 2018-03-10 MED ORDER — BENZONATATE 100 MG PO CAPS: 100.0000 mg | ORAL_CAPSULE | Freq: Every evening | ORAL | 0 refills | Status: DC | PRN

## 2018-03-10 MED ORDER — PREDNISONE 10 MG (21) PO TBPK: ORAL_TABLET | ORAL | 0 refills | Status: DC

## 2018-03-10 NOTE — Patient Instructions (Signed)

## 2018-03-10 NOTE — Progress Notes (Signed)
Subjective:     Patient ID: RAFFAELLA EDISON, female   DOB: 10/04/1951, 67 y.o.   MRN: [id number]  HPI  Blood pressure (!) 142/70, pulse 81, temperature 98.2 F (36.8 C), temperature source Tympanic, resp. rate 20, weight 155 lb 3.2 oz (70.4 kg), SpO2 98 %.   Recheck oxygen saturation in room 98 % by provider in room.   Patient is a 67 year old female in no acute distress who comes to the clinic for complaint of mild intermittent cough starting yesterday 03/10/18, occasionally coughing up small amount of clear sputum.   She reports she saw ENT  03/05/2018 and her  Doxycycline was stopped by ENT  as it was causing pain/ irritation and burning in her throat and down her stomach in her throat and she felt like at that time she was swallowing " rocks"- she reports this has since resolved.  She denies being given any new medications.  She denies any wheezing or shortness of breath. Denies pain with inspiration or expiration.  She had been on doxycycline since April 26 th for adult acne and saw Gastroenterology at recommendation of her ENT on 03/08/18  and  Now has a upper endoscopy scheduled on  03/24/18 .    No LMP recorded. Patient has had a hysterectomy.  12/09/17 saw Dion Body, MD for heart racing x 1 week, this resolved per patient. She denies any symptoms at this time.   Denies any other medication changes. She is taking Xyzal and does not like Flonase.   She denies any shortness of breath or any difficulty swallowing food or liquids.  She denies any ill contacts.  Patient  denies any fever, body aches,chills, rash, chest pain, shortness of breath, nausea, vomiting, or diarrhea.   Review of Systems  Constitutional: Negative.   HENT: Positive for postnasal drip (mild no more than " my normal allergies " ) and rhinorrhea. Negative for congestion, dental problem, drooling, ear discharge, ear pain, facial swelling, hearing loss, mouth sores, nosebleeds, sinus pressure, sinus pain, sneezing,  sore throat, tinnitus, trouble swallowing and voice change.   Eyes: Negative.   Respiratory: Positive for cough (intermittent ). Negative for apnea, choking, chest tightness, shortness of breath, wheezing and stridor.   Cardiovascular: Negative.   Gastrointestinal: Negative.   Endocrine: Negative.   Genitourinary: Negative.   Musculoskeletal: Negative.   Skin: Negative.   Allergic/Immunologic:        -- Adhesive (Tape)    --  Skin irritation.  Tegaderm OK  -- Codeine -- Nausea Only and Nausea And Vomiting  -- Nsaids -- Other (See Comments)   --  GI upset  -- Other -- Itching   --  Narcotic pain medications- causes itching  -- Pravastatin -- Other (See Comments)  -- Propoxyphene -- Itching  -- Amoxicillin -- Rash   --  Caused skin blister on back of left leg  -- Penicillins -- Itching and Rash   Neurological: Negative.   Hematological: Negative.   Psychiatric/Behavioral: Negative.        Objective:   Physical Exam  Constitutional: She is oriented to person, place, and time. Vital signs are normal. She appears well-developed and well-nourished. She is active.  Non-toxic appearance. She does not have a sickly appearance. She does not appear ill. No distress.  Patient is alert and oriented and responsive to questions Engages in eye contact with provider. Speaks in full sentences without any pauses without any shortness of breath or distress.    Patient  moves on and off of exam table and in room without difficulty. Gait is normal in hall and in room. Patient is oriented to person place time and situation. Patient answers questions appropriately and engages in conversation.   HENT:  Head: Normocephalic and atraumatic. Macrocephalic: .cobble.  Right Ear: Hearing, external ear and ear canal normal. Tympanic membrane is not perforated, not erythematous and not retracted. A middle ear effusion (mild ) is present.  Left Ear: Hearing, external ear and ear canal normal. Tympanic membrane  is not perforated, not erythematous and not retracted. A middle ear effusion (mild ) is present.  Nose: Rhinorrhea present. No mucosal edema or sinus tenderness. Right sinus exhibits no maxillary sinus tenderness and no frontal sinus tenderness. Left sinus exhibits no maxillary sinus tenderness and no frontal sinus tenderness.  Mouth/Throat: Uvula is midline and mucous membranes are normal. No uvula swelling. Posterior oropharyngeal erythema (very mild cobblestoning ) present. No oropharyngeal exudate, posterior oropharyngeal edema or tonsillar abscesses. Tonsils are 0 on the right. Tonsils are 0 on the left. No tonsillar exudate.   Cobblestoning posterior pharynx; bilateral mild allergic shiners; bilateral TMs air fluid level clear; mild nasal  clear discharge bilateral turbinates   Eyes: Pupils are equal, round, and reactive to light. Conjunctivae, EOM and lids are normal. Right eye exhibits no discharge. Left eye exhibits no discharge. No scleral icterus.  Neck: Trachea normal, normal range of motion, full passive range of motion without pain and phonation normal. Neck supple. No JVD present. No tracheal tenderness present. No tracheal deviation present. No Brudzinski's sign noted. No thyroid mass and no thyromegaly present.  Cardiovascular: Normal rate, regular rhythm, S1 normal, normal heart sounds and intact distal pulses. Exam reveals no gallop, no distant heart sounds and no friction rub.  No murmur heard. Pulmonary/Chest: Effort normal and breath sounds normal. No accessory muscle usage or stridor. No apnea, no tachypnea and no bradypnea. No respiratory distress. She has no wheezes. She has no rales. She exhibits no tenderness.  Abdominal: Soft. Normal appearance and bowel sounds are normal.  Musculoskeletal: Normal range of motion.  Lymphadenopathy:       Head (right side): No submental, no submandibular, no tonsillar, no preauricular, no posterior auricular and no occipital adenopathy  present.       Head (left side): No submental, no submandibular, no tonsillar, no preauricular, no posterior auricular and no occipital adenopathy present.    She has no cervical adenopathy.  Neurological: She is alert and oriented to person, place, and time. She has normal strength. She displays normal reflexes. No cranial nerve deficit. She exhibits normal muscle tone. Coordination normal.  Skin: Skin is warm, dry and intact. Capillary refill takes less than 2 seconds. No rash noted. She is not diaphoretic. No cyanosis or erythema. No pallor. Nails show no clubbing.  Psychiatric: She has a normal mood and affect. Her speech is normal and behavior is normal. Judgment and thought content normal. Cognition and memory are normal.  Vitals reviewed.      Assessment:       Cough  Abnormal finding on radiology exam - Plan: DG Chest 2 View Chest X Ray done 11/05/2016 was abnormal with infiltrate and radiologist recommend follow up Chest x ray- this was not done in this clinic - however provider will order to give prudent care and be sure area resolved. She reports she never went for repeat chest x ray at the time of previous.  Plan:     Orders Placed This  Encounter  Procedures  . DG Chest 2 View    Standing Status:   Future    Standing Expiration Date:   04/09/2018    Order Specific Question:   Reason for Exam (SYMPTOM  OR DIAGNOSIS REQUIRED)    Answer:   follow up from 11/05/2016 - infiltrate seen - see report    Order Specific Question:   Preferred imaging location?    Answer:   [facility]    Order Specific Question:   Call Results- Best Contact Number?    Answer:   [phone]    Order Specific Question:   Radiology Contrast Protocol - do NOT remove file path    Answer:   [email or web address]   Meds ordered this encounter  Medications  . predniSONE (STERAPRED UNI-PAK 21 TAB) 10 MG (21) TBPK tablet    Sig: PO: Take 6 tablets on day 1:Take 5  tablets day 2:Take 4 tablets day 3: Take 3 tablets day 4:Take 2 tablets day five: 5 Take 1 tablet day 6    Dispense:  21 tablet    Refill:  0  . benzonatate (TESSALON) 100 MG capsule    Sig: Take 1 capsule (100 mg total) by mouth at bedtime as needed and may repeat dose one time if needed for cough.    Dispense:  20 capsule    Refill:  0     Benzonatate only at bedtime - patient requested. Provider discussed trying Benadryl at bedtime per package instructions before trying Benzonatate.   Discussed signs of infection with patient and when to call her MD, also discussed signs of allergic reaction and what is an emergency.  She is instructed if symptoms do not improve or worsen she needs to follow up with her primary care MD Dion Body, MD and or Ear nose and throat  and Gastroenterology as soon as possible by phone. If after hours she will Call 911 if emergent and if not will proceed to nearest emergency room/ urgent care for follow up care. She is advised to let her PCP and specialists know any ongoing  issues to receive the best care.   Advised patient call the office or your primary care doctor for an appointment if no improvement within 72 hours or if any symptoms change or worsen at any time  Advised ER or urgent Care if after hours or on weekend. Call 911 for emergency symptoms at any time.Patinet verbalized understanding of all instructions given/reviewed and treatment plan and has no further questions or concerns at this time.    Patient verbalized understanding of all instructions given and denies any further questions at this time.

## 2018-03-11 ENCOUNTER — Ambulatory Visit
Admission: RE | Admit: 2018-03-11 | Discharge: 2018-03-11 | Disposition: A | Discharge status: Home / Self Care | Payer: BLUE CROSS/BLUE SHIELD | Source: Ambulatory Visit | Attending: Adult Health | Admitting: Adult Health

## 2018-03-11 DIAGNOSIS — J984 Other disorders of lung: Secondary | ICD-10-CM | POA: Diagnosis not present

## 2018-03-11 DIAGNOSIS — R9389 Abnormal findings on diagnostic imaging of other specified body structures: Secondary | ICD-10-CM

## 2018-03-11 DIAGNOSIS — R05 Cough: Secondary | ICD-10-CM | POA: Diagnosis not present

## 2018-03-12 ENCOUNTER — Telehealth: Payer: Self-pay | Admitting: Adult Health

## 2018-03-12 ENCOUNTER — Telehealth: Payer: Self-pay | Admitting: *Deleted

## 2018-03-12 DIAGNOSIS — R05 Cough: Secondary | ICD-10-CM

## 2018-03-12 DIAGNOSIS — R059 Cough, unspecified: Secondary | ICD-10-CM

## 2018-03-12 NOTE — Telephone Encounter (Signed)
Patient called 03/12/18 with chest x ray as below- provider discussed results on 03/11/18 with supervising physician Dr. Miguel Aschoff and he is in agreement given patients clinical history of cough to proceed with CT chest without contrast. She has slight scarring on left base this most recent chest x ray that was not on previous 2018- X ray.   Patient is aware and in agreement to have CT scan [facility] imaging. Provider will order and have nursing call to get scheduled.    Procedure Component Value Ref Range Date/Time  DG Chest 2 View [[id number]] Resulted: 03/11/18 1136  Order Status: Completed Updated: 03/11/18 1138  Narrative:   CLINICAL DATA: Cough  EXAM: CHEST - 2 VIEW  COMPARISON: November 05, 2016  FINDINGS: There is mild scarring in the left base. There is no edema or consolidation. The heart size and pulmonary vascularity are normal. No adenopathy. There is postoperative change in the lower cervical region.  IMPRESSION: No edema or consolidation. Slight scarring left base.   Electronically Signed By: Lowella Grip III M.D. On: 03/11/2018 11:36

## 2018-03-19 ENCOUNTER — Ambulatory Visit
Admission: RE | Admit: 2018-03-19 | Discharge: 2018-03-19 | Disposition: A | Discharge status: Home / Self Care | Payer: BLUE CROSS/BLUE SHIELD | Source: Ambulatory Visit | Attending: Adult Health | Admitting: Adult Health

## 2018-03-19 ENCOUNTER — Telehealth: Payer: Self-pay | Admitting: Adult Health

## 2018-03-19 DIAGNOSIS — R059 Cough, unspecified: Secondary | ICD-10-CM

## 2018-03-19 DIAGNOSIS — Z87898 Personal history of other specified conditions: Secondary | ICD-10-CM

## 2018-03-19 DIAGNOSIS — R05 Cough: Secondary | ICD-10-CM

## 2018-03-19 DIAGNOSIS — Z8709 Personal history of other diseases of the respiratory system: Secondary | ICD-10-CM

## 2018-03-19 DIAGNOSIS — R918 Other nonspecific abnormal finding of lung field: Secondary | ICD-10-CM

## 2018-03-19 NOTE — Progress Notes (Signed)
CT results discussed with patient. Patient is in agreement with referral for further work up. Continues to have a chronic cough. Urgent Referral placed 03/19/18 1:54pm  for Pulmonary evaluation with  Flora Lipps D MD. Address: [address], [place], [place] [address] Phone: [phone]

## 2018-03-19 NOTE — Telephone Encounter (Addendum)
Jane Riley with CT results as below. Patient with history of longstanding chronic cough.  CT scan results reviewed as below. Will refer urgently to Pulmonary. She still has persistent cough  With a history in Epic of multiple treatments with antibiotics.  though denies any acute change.  Patient  denies any fever, body aches,chills, rash, chest pain, shortness of breath, nausea, vomiting, or diarrhea.   Patient is appreciative and agrees to referral.  She will call the office if she has not heard from pulmonary by end of next week.  Advised patient call the office or your primary care doctor for an appointment if no improvement within 72 hours or if any symptoms change or worsen at any time  Advised ER or urgent Care if after hours or on weekend. Call 911 for emergency symptoms at any time.Patinet verbalized understanding of all instructions given/reviewed and treatment plan and has no further questions or concerns at this time.     Patient verbalized understanding of all instructions given and denies any further questions at this time.   Provider thoroughly discussed in collaboration above plan with supervising physician Dr. Miguel Aschoff who is in agreement with the care plan as above.   CLINICAL DATA:  Persistent cough  EXAM: CT CHEST WITHOUT CONTRAST  TECHNIQUE: Multidetector CT imaging of the chest was performed following the standard protocol without IV contrast.  COMPARISON:  Chest x-ray 03/11/2018  FINDINGS: Cardiovascular: Scattered aortic calcifications. Heart is normal size. Aorta is normal caliber.  Mediastinum/Nodes: No mediastinal, hilar, or axillary adenopathy.  Lungs/Pleura: Mild biapical scarring. There is mild bronchiectasis and scarring in the right middle lobe and lingula. Small subpleural nodules in the left lower lobe measure 5 mm on image 119 and 7 mm on image 117. Vague clustered ground-glass nodules noted in both lower lobes, best seen in the right  lower lobe. Small subpleural solid nodule in the right lower lobe on image 116 measures 4 mm. No effusions.  Upper Abdomen: Imaging into the upper abdomen shows no acute findings.  Musculoskeletal: Chest wall soft tissues are unremarkable. No acute bony abnormality.  IMPRESSION: Multiple small clustered ground-glass nodules in both lower lobes. Given the clustered appearance, I favor these are inflammatory.  Multiple small bilateral subpleural solid pulmonary nodules, the largest 7 mm. Non-contrast chest CT at 3-6 months is recommended. If the nodules are stable at time of repeat CT, then future CT at 18-24 months (from today's scan) is considered optional for low-risk patients, but is recommended for high-risk patients. This recommendation follows the consensus statement: Guidelines for Management of Incidental Pulmonary Nodules Detected on CT Images: From the Fleischner Society 2017; Radiology 2017; 284:228-243.  Biapical scarring. Bronchiectasis and scarring in the right middle 60c MAI infection.  Aortic Atherosclerosis (ICD10-I70.0).   Electronically Signed   By: Rolm Baptise M.D.   On: 03/19/2018 09:13

## 2018-03-24 ENCOUNTER — Ambulatory Visit
Admission: RE | Admit: 2018-03-24 | Discharge: 2018-03-24 | Disposition: A | Discharge status: Home / Self Care | Payer: BLUE CROSS/BLUE SHIELD | Source: Ambulatory Visit | Attending: Gastroenterology | Admitting: Gastroenterology

## 2018-03-24 ENCOUNTER — Encounter
Admission: RE | Disposition: A | Discharge status: Home / Self Care | Payer: Self-pay | Source: Ambulatory Visit | Attending: Gastroenterology

## 2018-03-24 ENCOUNTER — Ambulatory Visit: Payer: BLUE CROSS/BLUE SHIELD | Admitting: Anesthesiology

## 2018-03-24 ENCOUNTER — Other Ambulatory Visit: Payer: Self-pay

## 2018-03-24 DIAGNOSIS — Z886 Allergy status to analgesic agent status: Secondary | ICD-10-CM | POA: Insufficient documentation

## 2018-03-24 DIAGNOSIS — M479 Spondylosis, unspecified: Secondary | ICD-10-CM | POA: Insufficient documentation

## 2018-03-24 DIAGNOSIS — I10 Essential (primary) hypertension: Secondary | ICD-10-CM | POA: Diagnosis not present

## 2018-03-24 DIAGNOSIS — Z87891 Personal history of nicotine dependence: Secondary | ICD-10-CM | POA: Insufficient documentation

## 2018-03-24 DIAGNOSIS — Z791 Long term (current) use of non-steroidal anti-inflammatories (NSAID): Secondary | ICD-10-CM | POA: Insufficient documentation

## 2018-03-24 DIAGNOSIS — Z888 Allergy status to other drugs, medicaments and biological substances status: Secondary | ICD-10-CM | POA: Diagnosis not present

## 2018-03-24 DIAGNOSIS — R1310 Dysphagia, unspecified: Secondary | ICD-10-CM | POA: Diagnosis not present

## 2018-03-24 DIAGNOSIS — M19041 Primary osteoarthritis, right hand: Secondary | ICD-10-CM | POA: Insufficient documentation

## 2018-03-24 DIAGNOSIS — K219 Gastro-esophageal reflux disease without esophagitis: Secondary | ICD-10-CM | POA: Diagnosis not present

## 2018-03-24 DIAGNOSIS — M810 Age-related osteoporosis without current pathological fracture: Secondary | ICD-10-CM | POA: Diagnosis not present

## 2018-03-24 DIAGNOSIS — K228 Other specified diseases of esophagus: Secondary | ICD-10-CM

## 2018-03-24 DIAGNOSIS — E7800 Pure hypercholesterolemia, unspecified: Secondary | ICD-10-CM | POA: Diagnosis not present

## 2018-03-24 DIAGNOSIS — Z880 Allergy status to penicillin: Secondary | ICD-10-CM | POA: Diagnosis not present

## 2018-03-24 DIAGNOSIS — Z885 Allergy status to narcotic agent status: Secondary | ICD-10-CM | POA: Diagnosis not present

## 2018-03-24 DIAGNOSIS — K2289 Other specified disease of esophagus: Secondary | ICD-10-CM

## 2018-03-24 DIAGNOSIS — F329 Major depressive disorder, single episode, unspecified: Secondary | ICD-10-CM | POA: Insufficient documentation

## 2018-03-24 DIAGNOSIS — Z881 Allergy status to other antibiotic agents status: Secondary | ICD-10-CM | POA: Insufficient documentation

## 2018-03-24 DIAGNOSIS — M19042 Primary osteoarthritis, left hand: Secondary | ICD-10-CM | POA: Diagnosis not present

## 2018-03-24 DIAGNOSIS — R1319 Other dysphagia: Secondary | ICD-10-CM

## 2018-03-24 DIAGNOSIS — K210 Gastro-esophageal reflux disease with esophagitis: Secondary | ICD-10-CM | POA: Diagnosis not present

## 2018-03-24 DIAGNOSIS — Z79899 Other long term (current) drug therapy: Secondary | ICD-10-CM | POA: Insufficient documentation

## 2018-03-24 HISTORY — PX: ESOPHAGOGASTRODUODENOSCOPY (EGD) WITH PROPOFOL: SHX5813

## 2018-03-24 HISTORY — DX: Family history of other specified conditions: Z84.89

## 2018-03-24 SURGERY — ESOPHAGOGASTRODUODENOSCOPY (EGD) WITH PROPOFOL
Anesthesia: General

## 2018-03-24 MED ORDER — SODIUM CHLORIDE 0.9 % IV SOLN
INTRAVENOUS | Status: DC
  Administered 2018-03-24 (×2): via INTRAVENOUS

## 2018-03-24 MED ORDER — PROPOFOL 500 MG/50ML IV EMUL
INTRAVENOUS | Status: DC | PRN
  Administered 2018-03-24: 80 ug/kg/min via INTRAVENOUS

## 2018-03-24 MED ORDER — PROPOFOL 500 MG/50ML IV EMUL
INTRAVENOUS | Status: AC
  Filled 2018-03-24: qty 50

## 2018-03-24 MED ORDER — PROPOFOL 10 MG/ML IV BOLUS
INTRAVENOUS | Status: DC | PRN
  Administered 2018-03-24 (×2): 20 mg via INTRAVENOUS
  Administered 2018-03-24: 50 mg via INTRAVENOUS
  Administered 2018-03-24: 20 mg via INTRAVENOUS
  Administered 2018-03-24: 50 mg via INTRAVENOUS

## 2018-03-24 NOTE — H&P (Signed)
Vonda Antigua, MD [address] [place], [place], [place], [address] [address], [place], [place], [place], [address] Phone: [phone]  Fax: [phone]  Primary Care Physician:  Dion Body, MD   Pre-Procedure History & Physical: HPI:  Jane Riley is a 67 y.o. female is here for an EGD.   Past Medical History:  Diagnosis Date  . Arthritis    neck, hands  . CRPS (complex regional pain syndrome)   . Depression   . Family history of adverse reaction to anesthesia    mother nausea and vomiting  . GERD (gastroesophageal reflux disease)   . Hypercholesteremia   . Hypertension   . Osteoporosis   . PONV (postoperative nausea and vomiting)     Past Surgical History:  Procedure Laterality Date  . ABDOMINAL HYSTERECTOMY    . CATARACT EXTRACTION W/PHACO Right 01/12/2017   Procedure: CATARACT EXTRACTION PHACO AND INTRAOCULAR LENS PLACEMENT (IOC) Right Restor Lens;  Surgeon: Ronnell Freshwater, MD;  Location: [facility];  Service: Ophthalmology;  Laterality: Right;  Restor Lens  . CESAREAN SECTION     x2  . CHOLECYSTECTOMY    . NECK SURGERY     4 level fusion  . TONSILLECTOMY    . WRIST SURGERY Left 1914    Prior to Admission medications   Medication Sig Start Date End Date Taking? Authorizing Provider  benzonatate (TESSALON) 100 MG capsule Take 1 capsule (100 mg total) by mouth at bedtime as needed and may repeat dose one time if needed for cough. 03/10/18  Yes Flinchum, Kelby Aline, FNP  Biotin 5000 MCG TABS Take by mouth daily.   Yes [provider]  Cholecalciferol (VITAMIN D3) 1000 units CAPS Take by mouth daily.   Yes [provider]  citalopram (CELEXA) 20 MG tablet Take 20 mg by mouth daily.   Yes [provider]  Coenzyme Q10 (COQ10) 200 MG CAPS Take by mouth daily.   Yes [provider]  levocetirizine (XYZAL) 5 MG tablet Take 1 tablet (5 mg total) by mouth every evening. 10/07/17  Yes Ratcliffe, Heather  R, PA-C  lisinopril-hydrochlorothiazide (PRINZIDE,ZESTORETIC) 10-12.5 MG tablet Take 1 tablet by mouth daily.   Yes [provider]  lovastatin (MEVACOR) 20 MG tablet Take 20 mg by mouth at bedtime.   Yes [provider]  Lysine 1000 MG TABS Take by mouth as needed.   Yes [provider]  Magnesium Oxide 500 MG TABS Take 1 tablet by mouth daily with breakfast.   Yes [provider]  Multiple Vitamin (MULTIVITAMIN) tablet Take 1 tablet by mouth daily.   Yes [provider]  Omega-3 Fatty Acids (KP FISH OIL) 1200 MG CAPS Take by mouth.   Yes [provider]  omeprazole-sodium bicarbonate (ZEGERID) 40-1100 MG capsule Take 1 capsule by mouth daily before breakfast.   Yes [provider]  Probiotic Product (HEALTHY COLON PO) Take by mouth daily.   Yes [provider]  raloxifene (EVISTA) 60 MG tablet Take 60 mg by mouth daily.   Yes [provider]  Red Yeast Rice 600 MG TABS Take 1,200 mg by mouth daily.   Yes [provider]  vitamin C (ASCORBIC ACID) 500 MG tablet Take 500 mg by mouth daily.   Yes [provider]  vitamin E 400 UNIT capsule Take 400 Units by mouth daily.   Yes [provider]  DICLOFENAC POTASSIUM PO Take 50 mg by mouth daily.    [provider]  predniSONE (STERAPRED UNI-PAK 21 TAB) 10 MG (21) TBPK tablet PO: Take 6 tablets on day 1:Take 5 tablets day 2:Take 4 tablets day 3: Take 3 tablets day 4:Take 2 tablets day five: 5 Take 1 tablet day 6 03/10/18   Flinchum, Kelby Aline, FNP  sodium chloride (OCEAN) 0.65 % SOLN nasal spray Place 2 sprays into both nostrils every 2 (two) hours while awake. 05/20/17 06/19/17  Olen Cordial, NP    Allergies as of 03/09/2018 - Review Complete 03/08/2018  Allergen Reaction Noted  . Adhesive [tape]  01/05/2017  . Codeine Nausea Only and Nausea And Vomiting 02/20/2014  . Nsaids Other (See Comments) 02/20/2014  . Other Itching  02/20/2014  . Pravastatin Other (See Comments) 02/20/2014  . Propoxyphene Itching 02/20/2014  . Amoxicillin Rash 11/29/2014  . Penicillins Itching and Rash 11/05/2016    Family History  Problem Relation Age of Onset  . Breast cancer Cousin     Social History   Socioeconomic History  . Marital status: Married    Spouse name: Not on file  . Number of children: Not on file  . Years of education: Not on file  . Highest education level: Not on file  Occupational History  . Not on file  Social Needs  . Financial resource strain: Not on file  . Food insecurity:    Worry: Not on file    Inability: Not on file  . Transportation needs:    Medical: Not on file    Non-medical: Not on file  Tobacco Use  . Smoking status: Former Smoker    Last attempt to quit: 10/06/2006    Years since quitting: 11.4  . Smokeless tobacco: Never Used  Substance and Sexual Activity  . Alcohol use: Yes    Alcohol/week: 4.2 oz    Types: 7 Glasses of wine per week  . Drug use: Never  . Sexual activity: Not on file  Lifestyle  . Physical activity:    Days per week: Not on file    Minutes per session: Not on file  . Stress: Not on file  Relationships  . Social connections:    Talks on phone: Not on file    Gets together: Not on file    Attends religious service: Not on file    Active member of club or organization: Not on file    Attends meetings of clubs or organizations: Not on file    Relationship status: Not on file  . Intimate partner violence:    Fear of current or ex partner: Not on file    Emotionally abused: Not on file    Physically abused: Not on file    Forced sexual activity: Not on file  Other Topics Concern  . Not on file  Social History Narrative  . Not on file    Review of Systems: See HPI, otherwise negative ROS  Physical Exam: BP (!) 144/78   Pulse 80   Temp (!) 96 F (35.6 C) (Tympanic)   Resp 18   Ht 5\' 4"  (1.626 m)   Wt 150 lb (68 kg)   SpO2 97%   BMI 25.75  kg/m  General:   Alert,  pleasant and cooperative in NAD Head:  Normocephalic and atraumatic. Neck:  Supple; no masses or thyromegaly. Lungs:  Clear throughout to auscultation, normal respiratory effort.    Heart:  +S1, +S2, Regular rate and rhythm, No edema. Abdomen:  Soft, nontender and nondistended. Normal bowel sounds, without guarding, and without rebound.  Neurologic:  Alert and  oriented x4;  grossly normal neurologically.  Impression/Plan: Jane Riley is here for an EGD for dysphagia  Risks, benefits, limitations, and alternatives regarding the procedure have been reviewed with the patient.  Questions have been answered.  All parties agreeable.   Virgel Manifold, MD  03/24/2018, 12:01 PM

## 2018-03-24 NOTE — Anesthesia Post-op Follow-up Note (Signed)
Anesthesia QCDR form completed.        

## 2018-03-24 NOTE — Transfer of Care (Signed)
Immediate Anesthesia Transfer of Care Note  Patient: Jane Riley  Procedure(s) Performed: ESOPHAGOGASTRODUODENOSCOPY (EGD) WITH PROPOFOL (N/A )  Patient Location: PACU  Anesthesia Type:General  Level of Consciousness: awake  Airway & Oxygen Therapy: Patient Spontanous Breathing  Post-op Assessment: Report given to RN  Post vital signs: stable  Last Vitals:  Vitals Value Taken Time  BP 150/89 03/24/2018 12:27 PM  Temp 36.5 C 03/24/2018 12:27 PM  Pulse 80 03/24/2018 12:27 PM  Resp 18 03/24/2018 12:27 PM  SpO2 100 % 03/24/2018 12:27 PM  Vitals shown include unvalidated device data.  Last Pain:  Vitals:   03/24/18 1226  TempSrc: Tympanic         Complications: No apparent anesthesia complications

## 2018-03-24 NOTE — Anesthesia Preprocedure Evaluation (Addendum)
Anesthesia Evaluation  Patient identified by MRN, date of birth, ID band Patient awake    Reviewed: Allergy & Precautions, H&P , NPO status , Patient's Chart, lab work & pertinent test results, reviewed documented beta blocker date and time   History of Anesthesia Complications (+) PONV, Family history of anesthesia reaction and history of anesthetic complications  Airway Mallampati: I  TM Distance: >3 FB Neck ROM: full    Dental  (+) Caps, Dental Advidsory Given, Chipped, Teeth Intact   Pulmonary neg shortness of breath, neg COPD, neg recent URI, former smoker,           Cardiovascular Exercise Tolerance: Good hypertension, (-) angina(-) CAD, (-) Past MI, (-) Cardiac Stents and (-) CABG (-) dysrhythmias (-) Valvular Problems/Murmurs     Neuro/Psych PSYCHIATRIC DISORDERS Anxiety Depression negative neurological ROS     GI/Hepatic Neg liver ROS, GERD  ,  Endo/Other  negative endocrine ROS  Renal/GU negative Renal ROS  negative genitourinary   Musculoskeletal   Abdominal   Peds  Hematology negative hematology ROS (+)   Anesthesia Other Findings Past Medical History: No date: Arthritis     Comment:  neck, hands No date: CRPS (complex regional pain syndrome) No date: Depression No date: Family history of adverse reaction to anesthesia     Comment:  mother nausea and vomiting No date: GERD (gastroesophageal reflux disease) No date: Hypercholesteremia No date: Hypertension No date: Osteoporosis No date: PONV (postoperative nausea and vomiting)   Reproductive/Obstetrics negative OB ROS                            Anesthesia Physical Anesthesia Plan  ASA: II  Anesthesia Plan: General   Post-op Pain Management:    Induction: Intravenous  PONV Risk Score and Plan: 4 or greater and Propofol infusion  Airway Management Planned: Nasal Cannula  Additional Equipment:   Intra-op Plan:    Post-operative Plan:   Informed Consent: I have reviewed the patients History and Physical, chart, labs and discussed the procedure including the risks, benefits and alternatives for the proposed anesthesia with the patient or authorized representative who has indicated his/her understanding and acceptance.   Dental Advisory Given  Plan Discussed with: Anesthesiologist, CRNA and Surgeon  Anesthesia Plan Comments:         Anesthesia Quick Evaluation

## 2018-03-24 NOTE — Op Note (Signed)
[facility] Gastroenterology Patient Name: Jane Riley Procedure Date: 03/24/2018 11:58 AM MRN: [id number] Account #: [id number] Date of Birth: 01/26/1951 Admit Type: Outpatient Age: 67 Room: [facility] ENDO ROOM 2 Gender: Female Note Status: Finalized Procedure:            Upper GI endoscopy Indications:          Dysphagia Providers:            Josiel Gahm B. Bonna Gains MD, MD Referring MD:         Dion Body (Referring MD) Medicines:            Monitored Anesthesia Care Complications:        No immediate complications. Procedure:            Pre-Anesthesia Assessment:                       - The risks and benefits of the procedure and the                        sedation options and risks were discussed with the                        patient. All questions were answered and informed                        consent was obtained.                       - Patient identification and proposed procedure were                        verified prior to the procedure.                       - ASA Grade Assessment: II - A patient with mild                        systemic disease.                       After obtaining informed consent, the endoscope was                        passed under direct vision. Throughout the procedure,                        the patient's blood pressure, pulse, and oxygen                        saturations were monitored continuously. The Endoscope                        was introduced through the mouth, and advanced to the                        second part of duodenum. The upper GI endoscopy was                        accomplished with ease. The patient tolerated the  procedure well. Findings:      Mucosal changes including feline appearance were found in the entire       esophagus. Biopsies were obtained from the proximal and distal esophagus       with cold forceps for histology of suspected eosinophilic esophagitis.      The  exam of the esophagus was otherwise normal.      There is no endoscopic evidence of stenosis or stricture in the entire       esophagus.      The Z-line was regular.      The entire examined stomach was normal.      The examined duodenum was normal. Impression:           - Esophageal mucosal changes suggestive of eosinophilic                        esophagitis. Biopsied.                       - Z-line regular.                       - Normal stomach.                       - Normal examined duodenum. Recommendation:       - Discharge patient to home.                       - Advance diet as tolerated.                       - Continue present medications.                       - Await pathology results.                       - Return to my office as previously scheduled.                       - Return to primary care physician as previously                        scheduled.                       - The findings and recommendations were discussed with                        the patient.                       - The findings and recommendations were discussed with                        the patient's family. Procedure Code(s):    --- Professional ---                       [phone], Esophagogastroduodenoscopy, flexible, transoral;                        with biopsy, single or multiple Diagnosis Code(s):    --- Professional ---  K22.8, Other specified diseases of esophagus                       R13.10, Dysphagia, unspecified CPT copyright 2017 American Medical Association. All rights reserved. The codes documented in this report are preliminary and upon coder review may  be revised to meet current compliance requirements.  Vonda Antigua, MD Margretta Sidle B. Bonna Gains MD, MD 03/24/2018 12:24:58 PM This report has been signed electronically. Number of Addenda: 0 Note Initiated On: 03/24/2018 11:58 AM      [facility]

## 2018-03-25 ENCOUNTER — Encounter: Payer: Self-pay | Admitting: Gastroenterology

## 2018-03-25 NOTE — Anesthesia Postprocedure Evaluation (Signed)
Anesthesia Post Note  Patient: Jane Riley  Procedure(s) Performed: ESOPHAGOGASTRODUODENOSCOPY (EGD) WITH PROPOFOL (N/A )  Patient location during evaluation: Endoscopy Anesthesia Type: General Level of consciousness: awake and alert Pain management: pain level controlled Vital Signs Assessment: post-procedure vital signs reviewed and stable Respiratory status: spontaneous breathing, nonlabored ventilation, respiratory function stable and patient connected to nasal cannula oxygen Cardiovascular status: blood pressure returned to baseline and stable Postop Assessment: no apparent nausea or vomiting Anesthetic complications: no     Last Vitals:  Vitals:   03/24/18 1250 03/24/18 1259  BP:  128/65  Pulse: 69 69  Resp: 19 17  Temp:    SpO2: 99% 99%    Last Pain:  Vitals:   03/25/18 0737  TempSrc:   PainSc: 0-No pain                 Precious Haws Nitish Roes

## 2018-03-26 LAB — SURGICAL PATHOLOGY

## 2018-03-29 DIAGNOSIS — H26492 Other secondary cataract, left eye: Secondary | ICD-10-CM | POA: Diagnosis not present

## 2018-03-29 NOTE — Progress Notes (Signed)
[facility] Consultation      Assessment and Plan:  Chronic cough with chronic bronchitis which is been present for approximately 3 years. -Etiology uncertain may be related to chronic bronchitis, ACE inhibitor, multiple allergies with upper airway cough syndrome, GERD.  Patient also had a family member who had MAI, and CT findings raise concern for this. - We will empirically stop the patient's ACE inhibitor, switch to Norvasc, for 6 weeks and see how she does. - Start Arnuity steroid inhaler empirically for 1 month. - Continue current antihistamine, antireflux measures. - If no improvement will consider bronchoscopy versus other empiric therapies.  Lung nodule. - Needs repeat CT chest in approximately 6 months. - Appears low risk for cancer at this time, but will need continued surveillance.  Meds ordered this encounter  Medications  . hydrochlorothiazide (MICROZIDE) 12.5 MG capsule    Sig: Take 1 capsule (12.5 mg total) by mouth daily.    Dispense:  30 capsule    Refill:  5  . amLODipine (NORVASC) 5 MG tablet    Sig: Take 1 tablet (5 mg total) by mouth daily.    Dispense:  30 tablet    Refill:  5   Return in about 6 weeks (around 05/11/2018).   Date: 03/30/2018  MRN# [id number] Jane Riley [birth year]    Jane Riley is a 67 y.o. old female seen in consultation for chief complaint of:    Chief Complaint  Patient presents with  . Consult    Referred by M.Flinchum hx of chronic cough and abn CT scan  . Cough    non productive  . Shortness of Breath    with exertion and carrying weight    HPI:   The patient is a 67 year old female with a cough which started in the first week of June.  Patient had previously seen Dr. Raul Del with chronic bronchitis and chronic cough, in February 2018.  At that time she appeared to have some postnasal drip and GERD. She actually notes the cough has been on and off for about 3 years. It would eventually go away on its  own. She had a pneumonia in January of last year she went to see Dr. Raul Del. She works at [employer].  She has nasal drainage for which she takes xyzal, she does not take nasal spray as she did not notice a difference for years. She had been on allergy shots in the past.  She does have reflux and takes omeprazole.  No occupational exposures.  She last smoked socially about 10 yrs ago.  She has a dog, which sleeps in the bed.  She was tested for allergies, she was allergic to several things, but not the dog.  She has not been on inhalers, she tried prednisone in the past and does not like to take it because it makes her jittery.  She does not snore at night, has never been tested for OSA, she is not sleepy during the day.  She does take an ACE inhibitor.   **CT chest 03/19/2018>> images personally reviewed, there are tiny bilateral basal nodules, largest of which is 7 mm. **Absolute eosinophil count 05/28/2017>> 100  **PFT 11/23/2016>> tracings personally reviewed, consistent with normal pulmonary functions. SPIROMETRY: FVC was 2.26 liters, 80% of predicted FEV1 was 1.81, 79% of predicted FEV1 ratio was 80 FEF 25-75% liters per second was 81% of predicted  LUNG VOLUMES: TLC was 86% of predicted RV was 98% of predicted  DIFFUSION CAPACITY: DLCO  was 83% of predicted DLCO/VA was 153% of predicted  FLOW VOLUME LOOP: Normal   Impression Overall a normal study      PMHX:   Past Medical History:  Diagnosis Date  . Arthritis    neck, hands  . CRPS (complex regional pain syndrome)   . Depression   . Family history of adverse reaction to anesthesia    mother nausea and vomiting  . GERD (gastroesophageal reflux disease)   . Hypercholesteremia   . Hypertension   . Osteoporosis   . PONV (postoperative nausea and vomiting)    Surgical Hx:  Past Surgical History:  Procedure Laterality Date  . ABDOMINAL HYSTERECTOMY    . CATARACT EXTRACTION W/PHACO Right 01/12/2017   Procedure:  CATARACT EXTRACTION PHACO AND INTRAOCULAR LENS PLACEMENT (IOC) Right Restor Lens;  Surgeon: Ronnell Freshwater, MD;  Location: [facility];  Service: Ophthalmology;  Laterality: Right;  Restor Lens  . CESAREAN SECTION     x2  . CHOLECYSTECTOMY    . ESOPHAGOGASTRODUODENOSCOPY (EGD) WITH PROPOFOL N/A 03/24/2018   Procedure: ESOPHAGOGASTRODUODENOSCOPY (EGD) WITH PROPOFOL;  Surgeon: Virgel Manifold, MD;  Location: [facility] ENDOSCOPY;  Service: Endoscopy;  Laterality: N/A;  . NECK SURGERY     4 level fusion  . TONSILLECTOMY    . WRIST SURGERY Left 1914   Family Hx:  Family History  Problem Relation Age of Onset  . Breast cancer Cousin    Social Hx:   Social History   Tobacco Use  . Smoking status: Former Smoker    Last attempt to quit: 10/06/2006    Years since quitting: 11.4  . Smokeless tobacco: Never Used  Substance Use Topics  . Alcohol use: Yes    Alcohol/week: 4.2 oz    Types: 7 Glasses of wine per week  . Drug use: Never   Medication:    Current Outpatient Medications:  .  Biotin 5000 MCG TABS, Take by mouth daily., Disp: , Rfl:  .  Cholecalciferol (VITAMIN D3) 1000 units CAPS, Take by mouth daily., Disp: , Rfl:  .  citalopram (CELEXA) 20 MG tablet, Take 20 mg by mouth daily., Disp: , Rfl:  .  clindamycin (CLEOCIN T) 1 % lotion, Apply 1 application topically as needed., Disp: , Rfl: 1 .  Coenzyme Q10 (COQ10) 200 MG CAPS, Take by mouth daily., Disp: , Rfl:  .  levocetirizine (XYZAL) 5 MG tablet, Take 1 tablet (5 mg total) by mouth every evening., Disp: 30 tablet, Rfl: 0 .  lisinopril-hydrochlorothiazide (PRINZIDE,ZESTORETIC) 10-12.5 MG tablet, Take 1 tablet by mouth daily., Disp: , Rfl:  .  lovastatin (MEVACOR) 20 MG tablet, Take 20 mg by mouth at bedtime., Disp: , Rfl:  .  Lysine 1000 MG TABS, Take by mouth as needed., Disp: , Rfl:  .  Magnesium Oxide 500 MG TABS, Take 1 tablet by mouth daily with breakfast., Disp: , Rfl:  .  Multiple Vitamin (MULTIVITAMIN)  tablet, Take 1 tablet by mouth daily., Disp: , Rfl:  .  Omega-3 Fatty Acids (KP FISH OIL) 1200 MG CAPS, Take by mouth., Disp: , Rfl:  .  omeprazole-sodium bicarbonate (ZEGERID) 40-1100 MG capsule, Take 1 capsule by mouth daily before breakfast., Disp: , Rfl:  .  Probiotic Product (HEALTHY COLON PO), Take by mouth daily., Disp: , Rfl:  .  raloxifene (EVISTA) 60 MG tablet, Take 60 mg by mouth daily., Disp: , Rfl:  .  Red Yeast Rice 600 MG TABS, Take 1,200 mg by mouth daily., Disp: , Rfl:  .  vitamin C (ASCORBIC ACID) 500 MG tablet, Take 500 mg by mouth daily., Disp: , Rfl:  .  vitamin E 400 UNIT capsule, Take 400 Units by mouth daily., Disp: , Rfl:  .  sodium chloride (OCEAN) 0.65 % SOLN nasal spray, Place 2 sprays into both nostrils every 2 (two) hours while awake., Disp: , Rfl: 0   Allergies:  Adhesive [tape]; Codeine; Doxycycline; Nsaids; Other; Pravastatin; Propoxyphene; Amoxicillin; and Penicillins  Review of Systems: Gen:  Denies  fever, sweats, chills HEENT: Denies blurred vision, double vision. bleeds, sore throat Cvc:  No dizziness, chest pain. Resp:   Denies cough or sputum production, shortness of breath Gi: Denies swallowing difficulty, stomach pain. Gu:  Denies bladder incontinence, burning urine Ext:   No Joint pain, stiffness. Skin: No skin rash,  hives  Endoc:  No polyuria, polydipsia. Psych: No depression, insomnia. Other:  All other systems were reviewed with the patient and were negative other that what is mentioned in the HPI.   Physical Examination:   VS: BP 138/80 (BP Location: Left Arm, Cuff Size: Normal)   Pulse 84   Resp 16   Ht 5\' 4"  (1.626 m)   Wt 153 lb (69.4 kg)   SpO2 97%   BMI 26.26 kg/m   General Appearance: No distress  Neuro:without focal findings,  speech normal,  HEENT: PERRLA, EOM intact.   Pulmonary: normal breath sounds, No wheezing.  CardiovascularNormal S1,S2.  No m/r/g.   Abdomen: Benign, Soft, non-tender. Renal:  No costovertebral  tenderness  GU:  No performed at this time. Endoc: No evident thyromegaly, no signs of acromegaly. Skin:   warm, no rashes, no ecchymosis  Extremities: normal, no cyanosis, clubbing.  Other findings:    LABORATORY PANEL:   CBC No results for input(s): WBC, HGB, HCT, PLT in the last 168 hours. ------------------------------------------------------------------------------------------------------------------  Chemistries  No results for input(s): NA, K, CL, CO2, GLUCOSE, BUN, CREATININE, CALCIUM, MG, AST, ALT, ALKPHOS, BILITOT in the last 168 hours.  Invalid input(s): GFRCGP ------------------------------------------------------------------------------------------------------------------  Cardiac Enzymes No results for input(s): TROPONINI in the last 168 hours. ------------------------------------------------------------  RADIOLOGY:  No results found.     Thank  you for the consultation and for allowing [facility] to assist in the care of your patient. Our recommendations are noted above.  Please contact us if we can be of further service.   Marda Stalker, M.D., F.C.C.P.  Board Certified in Internal Medicine, Pulmonary Medicine, [place], and Sleep Medicine.  [facility] Office Number: [phone]   03/30/2018

## 2018-03-30 ENCOUNTER — Encounter: Payer: Self-pay | Admitting: Internal Medicine

## 2018-03-30 ENCOUNTER — Ambulatory Visit: Payer: BLUE CROSS/BLUE SHIELD | Admitting: Internal Medicine

## 2018-03-30 VITALS — BP 138/80 | HR 84 | Resp 16 | Ht 64.0 in | Wt 153.0 lb

## 2018-03-30 DIAGNOSIS — R05 Cough: Secondary | ICD-10-CM | POA: Diagnosis not present

## 2018-03-30 DIAGNOSIS — J42 Unspecified chronic bronchitis: Secondary | ICD-10-CM | POA: Diagnosis not present

## 2018-03-30 DIAGNOSIS — R059 Cough, unspecified: Secondary | ICD-10-CM

## 2018-03-30 DIAGNOSIS — R911 Solitary pulmonary nodule: Secondary | ICD-10-CM | POA: Diagnosis not present

## 2018-03-30 MED ORDER — FLUTICASONE FUROATE 100 MCG/ACT IN AEPB: 1.0000 | INHALATION_SPRAY | Freq: Every day | RESPIRATORY_TRACT | 0 refills | Status: DC

## 2018-03-30 MED ORDER — HYDROCHLOROTHIAZIDE 12.5 MG PO CAPS: 12.5000 mg | ORAL_CAPSULE | Freq: Every day | ORAL | 5 refills | Status: DC

## 2018-03-30 MED ORDER — AMLODIPINE BESYLATE 5 MG PO TABS: 5.0000 mg | ORAL_TABLET | Freq: Every day | ORAL | 5 refills | Status: DC

## 2018-03-30 NOTE — Patient Instructions (Signed)
Stop Zestoretic, start the new hypertension medications which have been prescribed. Start using the inhaler 1 puff once daily, rinse mouth after use. Follow-up in 6 weeks

## 2018-03-30 NOTE — Addendum Note (Signed)
Addended by: Stephanie Coup on: 03/30/2018 10:01 AM   Modules accepted: Orders

## 2018-04-01 DIAGNOSIS — H26492 Other secondary cataract, left eye: Secondary | ICD-10-CM | POA: Diagnosis not present

## 2018-04-14 ENCOUNTER — Telehealth: Payer: Self-pay | Admitting: Internal Medicine

## 2018-04-14 NOTE — Telephone Encounter (Signed)
Pt made aware that Dr. Ashby Dawes out of office until Friday. She was advised to d/c Amlodipine due to swelling in ankles. She was advised to contact pcp for further evaluation of bp medication. Lisinopril was stopped and changed to Amlodipine.

## 2018-04-14 NOTE — Telephone Encounter (Signed)
Left message to call back  

## 2018-04-14 NOTE — Telephone Encounter (Signed)
Patient saw Dr. Juanell Fairly on 6/25 Patient started on BP medication and since then her feet have been swelling  Patients mother has also had this symptom with same BP medication Would like to know of another option Please call to discuss

## 2018-05-16 NOTE — H&P (View-Only) (Signed)
[facility] Consultation      Assessment and Plan:  Chronic cough with chronic bronchitis which is been present for approximately 3 years. -Etiology uncertain may be related to chronic bronchitis, ACE inhibitor, multiple allergies with upper airway cough syndrome, GERD.  Patient also had a family member who had MAI, and CT findings raise concern for this. -Patient has undergone empiric measures including cessation of ACE inhibitor, steroid inhaler, antihistamine, antireflux measures, which have been unsuccessful.  We will therefore plan for diagnostic bronchoscopy in 1 to 2 weeks. Lung nodule. - Needs repeat CT chest in approximately 6 months, late 2019, early 2020. - Appears low risk for cancer at this time, but will need continued surveillance.   Return in about 3 weeks (around 06/07/2018) for post bronchoscopy followup. .      Date: 05/16/2018  MRN# [id number] Jane Riley 09-18-1951    Jane Riley is a 67 y.o. old female seen in consultation for chief complaint of:    Chief Complaint  Patient presents with  . Follow-up    cough no better, mostly dry: SOB w/actvity    HPI:   The patient is a 67 year old female with a cough which started in the first week of June, but has been present on and off for about 3 years. At last visit was taken off ace and switched to Norvasc but stopped due to leg swelling. She was started on Arnuity sample which did not help. She has been restarted on lisinopril.    She has a dog, which sleeps in the bed.  She was tested for allergies, she was allergic to several things, but not the dog. She has completed allergy shots.  She has not been on inhalers, she tried prednisone in the past and does not like to take it because it makes her jittery.  She does not snore at night, has never been tested for OSA, she is not sleepy during the day.    **CT chest 03/19/2018>> images personally reviewed, there are tiny bilateral basal nodules,  largest of which is 7 mm. **Absolute eosinophil count 05/28/2017>> 100  **PFT 11/23/2016>> tracings personally reviewed, consistent with normal pulmonary functions. SPIROMETRY: FVC was 2.26 liters, 80% of predicted FEV1 was 1.81, 79% of predicted FEV1 ratio was 80 FEF 25-75% liters per second was 81% of predicted  LUNG VOLUMES: TLC was 86% of predicted RV was 98% of predicted  DIFFUSION CAPACITY: DLCO was 83% of predicted DLCO/VA was 153% of predicted  FLOW VOLUME LOOP: Normal   Impression Overall a normal study   Medication:    Current Outpatient Medications:  .  amLODipine (NORVASC) 5 MG tablet, Take 1 tablet (5 mg total) by mouth daily., Disp: 30 tablet, Rfl: 5 .  Biotin 5000 MCG TABS, Take by mouth daily., Disp: , Rfl:  .  Cholecalciferol (VITAMIN D3) 1000 units CAPS, Take by mouth daily., Disp: , Rfl:  .  citalopram (CELEXA) 20 MG tablet, Take 20 mg by mouth daily., Disp: , Rfl:  .  clindamycin (CLEOCIN T) 1 % lotion, Apply 1 application topically as needed., Disp: , Rfl: 1 .  Coenzyme Q10 (COQ10) 200 MG CAPS, Take by mouth daily., Disp: , Rfl:  .  Fluticasone Furoate (ARNUITY ELLIPTA) 100 MCG/ACT AEPB, Inhale 1 puff into the lungs daily., Disp: 14 each, Rfl: 0 .  hydrochlorothiazide (MICROZIDE) 12.5 MG capsule, Take 1 capsule (12.5 mg total) by mouth daily., Disp: 30 capsule, Rfl: 5 .  levocetirizine (XYZAL) 5 MG  tablet, Take 1 tablet (5 mg total) by mouth every evening., Disp: 30 tablet, Rfl: 0 .  lisinopril-hydrochlorothiazide (PRINZIDE,ZESTORETIC) 10-12.5 MG tablet, Take 1 tablet by mouth daily., Disp: , Rfl:  .  lovastatin (MEVACOR) 20 MG tablet, Take 20 mg by mouth at bedtime., Disp: , Rfl:  .  Lysine 1000 MG TABS, Take by mouth as needed., Disp: , Rfl:  .  Magnesium Oxide 500 MG TABS, Take 1 tablet by mouth daily with breakfast., Disp: , Rfl:  .  Multiple Vitamin (MULTIVITAMIN) tablet, Take 1 tablet by mouth daily., Disp: , Rfl:  .  Omega-3 Fatty Acids (KP FISH  OIL) 1200 MG CAPS, Take by mouth., Disp: , Rfl:  .  omeprazole-sodium bicarbonate (ZEGERID) 40-1100 MG capsule, Take 1 capsule by mouth daily before breakfast., Disp: , Rfl:  .  Probiotic Product (HEALTHY COLON PO), Take by mouth daily., Disp: , Rfl:  .  raloxifene (EVISTA) 60 MG tablet, Take 60 mg by mouth daily., Disp: , Rfl:  .  Red Yeast Rice 600 MG TABS, Take 1,200 mg by mouth daily., Disp: , Rfl:  .  sodium chloride (OCEAN) 0.65 % SOLN nasal spray, Place 2 sprays into both nostrils every 2 (two) hours while awake., Disp: , Rfl: 0 .  vitamin C (ASCORBIC ACID) 500 MG tablet, Take 500 mg by mouth daily., Disp: , Rfl:  .  vitamin E 400 UNIT capsule, Take 400 Units by mouth daily., Disp: , Rfl:    Allergies:  Adhesive [tape]; Codeine; Doxycycline; Nsaids; Other; Pravastatin; Propoxyphene; Amoxicillin; and Penicillins  Review of Systems: Gen:  Denies  fever, sweats, chills HEENT: Denies blurred vision, double vision. bleeds, sore throat Cvc:  No dizziness, chest pain. Resp:   Denies cough or sputum production, shortness of breath Gi: Denies swallowing difficulty, stomach pain. Gu:  Denies bladder incontinence, burning urine Ext:   No Joint pain, stiffness. Skin: No skin rash,  hives  Endoc:  No polyuria, polydipsia. Psych: No depression, insomnia. Other:  All other systems were reviewed with the patient and were negative other that what is mentioned in the HPI.   Physical Examination:   VS: BP 110/60 (BP Location: Left Arm, Cuff Size: Normal)   Pulse 81   Ht 5\' 4"  (1.626 m)   Wt 152 lb (68.9 kg)   SpO2 100%   BMI 26.09 kg/m   General Appearance: No distress  Neuro:without focal findings,  speech normal,  HEENT: PERRLA, EOM intact.   Pulmonary: normal breath sounds, No wheezing.  CardiovascularNormal S1,S2.  No m/r/g.   Abdomen: Benign, Soft, non-tender. Renal:  No costovertebral tenderness  GU:  No performed at this time. Endoc: No evident thyromegaly, no signs of  acromegaly. Skin:   warm, no rashes, no ecchymosis  Extremities: normal, no cyanosis, clubbing.  Other findings:    LABORATORY PANEL:   CBC No results for input(s): WBC, HGB, HCT, PLT in the last 168 hours. ------------------------------------------------------------------------------------------------------------------  Chemistries  No results for input(s): NA, K, CL, CO2, GLUCOSE, BUN, CREATININE, CALCIUM, MG, AST, ALT, ALKPHOS, BILITOT in the last 168 hours.  Invalid input(s): GFRCGP ------------------------------------------------------------------------------------------------------------------  Cardiac Enzymes No results for input(s): TROPONINI in the last 168 hours. ------------------------------------------------------------  RADIOLOGY:  No results found.     Thank  you for the consultation and for allowing [facility] to assist in the care of your patient. Our recommendations are noted above.  Please contact us if we can be of further service.   Marda Stalker, M.D.,  F.C.C.P.  Board Certified in Internal Medicine, Pulmonary Medicine, [place], and Sleep Medicine.  [facility] Office Number: [phone]   05/16/2018

## 2018-05-16 NOTE — Progress Notes (Addendum)
[facility] Consultation      Assessment and Plan:  Chronic cough with chronic bronchitis which is been present for approximately 3 years. -Etiology uncertain may be related to chronic bronchitis, ACE inhibitor, multiple allergies with upper airway cough syndrome, GERD.  Patient also had a family member who had MAI, and CT findings raise concern for this. -Patient has undergone empiric measures including cessation of ACE inhibitor, steroid inhaler, antihistamine, antireflux measures, which have been unsuccessful.  We will therefore plan for diagnostic bronchoscopy in 1 to 2 weeks. Lung nodule. - Needs repeat CT chest in approximately 6 months, late 2019, early 2020. - Appears low risk for cancer at this time, but will need continued surveillance.   Return in about 3 weeks (around 06/07/2018) for post bronchoscopy followup. .      Date: 05/16/2018  MRN# [id number] Jane Riley April 19, 1951    Jane Riley is a 67 y.o. old female seen in consultation for chief complaint of:    Chief Complaint  Patient presents with  . Follow-up    cough no better, mostly dry: SOB w/actvity    HPI:   The patient is a 67 year old female with a cough which started in the first week of June, but has been present on and off for about 3 years. At last visit was taken off ace and switched to Norvasc but stopped due to leg swelling. She was started on Arnuity sample which did not help. She has been restarted on lisinopril.    She has a dog, which sleeps in the bed.  She was tested for allergies, she was allergic to several things, but not the dog. She has completed allergy shots.  She has not been on inhalers, she tried prednisone in the past and does not like to take it because it makes her jittery.  She does not snore at night, has never been tested for OSA, she is not sleepy during the day.    **CT chest 03/19/2018>> images personally reviewed, there are tiny bilateral basal nodules,  largest of which is 7 mm. **Absolute eosinophil count 05/28/2017>> 100  **PFT 11/23/2016>> tracings personally reviewed, consistent with normal pulmonary functions. SPIROMETRY: FVC was 2.26 liters, 80% of predicted FEV1 was 1.81, 79% of predicted FEV1 ratio was 80 FEF 25-75% liters per second was 81% of predicted  LUNG VOLUMES: TLC was 86% of predicted RV was 98% of predicted  DIFFUSION CAPACITY: DLCO was 83% of predicted DLCO/VA was 153% of predicted  FLOW VOLUME LOOP: Normal   Impression Overall a normal study   Medication:    Current Outpatient Medications:  .  amLODipine (NORVASC) 5 MG tablet, Take 1 tablet (5 mg total) by mouth daily., Disp: 30 tablet, Rfl: 5 .  Biotin 5000 MCG TABS, Take by mouth daily., Disp: , Rfl:  .  Cholecalciferol (VITAMIN D3) 1000 units CAPS, Take by mouth daily., Disp: , Rfl:  .  citalopram (CELEXA) 20 MG tablet, Take 20 mg by mouth daily., Disp: , Rfl:  .  clindamycin (CLEOCIN T) 1 % lotion, Apply 1 application topically as needed., Disp: , Rfl: 1 .  Coenzyme Q10 (COQ10) 200 MG CAPS, Take by mouth daily., Disp: , Rfl:  .  Fluticasone Furoate (ARNUITY ELLIPTA) 100 MCG/ACT AEPB, Inhale 1 puff into the lungs daily., Disp: 14 each, Rfl: 0 .  hydrochlorothiazide (MICROZIDE) 12.5 MG capsule, Take 1 capsule (12.5 mg total) by mouth daily., Disp: 30 capsule, Rfl: 5 .  levocetirizine (XYZAL) 5 MG  tablet, Take 1 tablet (5 mg total) by mouth every evening., Disp: 30 tablet, Rfl: 0 .  lisinopril-hydrochlorothiazide (PRINZIDE,ZESTORETIC) 10-12.5 MG tablet, Take 1 tablet by mouth daily., Disp: , Rfl:  .  lovastatin (MEVACOR) 20 MG tablet, Take 20 mg by mouth at bedtime., Disp: , Rfl:  .  Lysine 1000 MG TABS, Take by mouth as needed., Disp: , Rfl:  .  Magnesium Oxide 500 MG TABS, Take 1 tablet by mouth daily with breakfast., Disp: , Rfl:  .  Multiple Vitamin (MULTIVITAMIN) tablet, Take 1 tablet by mouth daily., Disp: , Rfl:  .  Omega-3 Fatty Acids (KP FISH  OIL) 1200 MG CAPS, Take by mouth., Disp: , Rfl:  .  omeprazole-sodium bicarbonate (ZEGERID) 40-1100 MG capsule, Take 1 capsule by mouth daily before breakfast., Disp: , Rfl:  .  Probiotic Product (HEALTHY COLON PO), Take by mouth daily., Disp: , Rfl:  .  raloxifene (EVISTA) 60 MG tablet, Take 60 mg by mouth daily., Disp: , Rfl:  .  Red Yeast Rice 600 MG TABS, Take 1,200 mg by mouth daily., Disp: , Rfl:  .  sodium chloride (OCEAN) 0.65 % SOLN nasal spray, Place 2 sprays into both nostrils every 2 (two) hours while awake., Disp: , Rfl: 0 .  vitamin C (ASCORBIC ACID) 500 MG tablet, Take 500 mg by mouth daily., Disp: , Rfl:  .  vitamin E 400 UNIT capsule, Take 400 Units by mouth daily., Disp: , Rfl:    Allergies:  Adhesive [tape]; Codeine; Doxycycline; Nsaids; Other; Pravastatin; Propoxyphene; Amoxicillin; and Penicillins  Review of Systems: Gen:  Denies  fever, sweats, chills HEENT: Denies blurred vision, double vision. bleeds, sore throat Cvc:  No dizziness, chest pain. Resp:   Denies cough or sputum production, shortness of breath Gi: Denies swallowing difficulty, stomach pain. Gu:  Denies bladder incontinence, burning urine Ext:   No Joint pain, stiffness. Skin: No skin rash,  hives  Endoc:  No polyuria, polydipsia. Psych: No depression, insomnia. Other:  All other systems were reviewed with the patient and were negative other that what is mentioned in the HPI.   Physical Examination:   VS: BP 110/60 (BP Location: Left Arm, Cuff Size: Normal)   Pulse 81   Ht 5\' 4"  (1.626 m)   Wt 152 lb (68.9 kg)   SpO2 100%   BMI 26.09 kg/m   General Appearance: No distress  Neuro:without focal findings,  speech normal,  HEENT: PERRLA, EOM intact.   Pulmonary: normal breath sounds, No wheezing.  CardiovascularNormal S1,S2.  No m/r/g.   Abdomen: Benign, Soft, non-tender. Renal:  No costovertebral tenderness  GU:  No performed at this time. Endoc: No evident thyromegaly, no signs of  acromegaly. Skin:   warm, no rashes, no ecchymosis  Extremities: normal, no cyanosis, clubbing.  Other findings:    LABORATORY PANEL:   CBC No results for input(s): WBC, HGB, HCT, PLT in the last 168 hours. ------------------------------------------------------------------------------------------------------------------  Chemistries  No results for input(s): NA, K, CL, CO2, GLUCOSE, BUN, CREATININE, CALCIUM, MG, AST, ALT, ALKPHOS, BILITOT in the last 168 hours.  Invalid input(s): GFRCGP ------------------------------------------------------------------------------------------------------------------  Cardiac Enzymes No results for input(s): TROPONINI in the last 168 hours. ------------------------------------------------------------  RADIOLOGY:  No results found.     Thank  you for the consultation and for allowing [facility] to assist in the care of your patient. Our recommendations are noted above.  Please contact us if we can be of further service.   Marda Stalker, M.D.,  F.C.C.P.  Board Certified in Internal Medicine, Pulmonary Medicine, [place], and Sleep Medicine.  [facility] Office Number: [phone]   05/16/2018

## 2018-05-17 ENCOUNTER — Ambulatory Visit: Payer: BLUE CROSS/BLUE SHIELD | Admitting: Internal Medicine

## 2018-05-17 ENCOUNTER — Encounter: Payer: Self-pay | Admitting: Internal Medicine

## 2018-05-17 VITALS — BP 110/60 | HR 81 | Ht 64.0 in | Wt 152.0 lb

## 2018-05-17 DIAGNOSIS — J42 Unspecified chronic bronchitis: Secondary | ICD-10-CM

## 2018-05-17 DIAGNOSIS — R05 Cough: Secondary | ICD-10-CM | POA: Diagnosis not present

## 2018-05-17 DIAGNOSIS — R059 Cough, unspecified: Secondary | ICD-10-CM

## 2018-05-17 NOTE — Patient Instructions (Signed)
Will arrange for bronschoscopy in 1-2 weeks and followup 3-4 weeks later.

## 2018-05-25 ENCOUNTER — Encounter: Payer: Self-pay | Admitting: *Deleted

## 2018-05-25 NOTE — Pre-Procedure Instructions (Signed)
Telephone contact made with patient to review her medications and which ones to take on the day of the procedure.  Policies reviewed for preop : NPO status / drinking clear liqs / having a ride home, etc. Patient understood information and is okay with which medications to take.

## 2018-05-27 ENCOUNTER — Ambulatory Visit
Admission: RE | Admit: 2018-05-27 | Discharge: 2018-05-27 | Disposition: A | Discharge status: Home / Self Care | Payer: BLUE CROSS/BLUE SHIELD | Source: Ambulatory Visit | Attending: Internal Medicine | Admitting: Internal Medicine

## 2018-05-27 ENCOUNTER — Encounter: Payer: Self-pay | Admitting: Emergency Medicine

## 2018-05-27 ENCOUNTER — Encounter
Admission: RE | Disposition: A | Discharge status: Home / Self Care | Payer: Self-pay | Source: Ambulatory Visit | Attending: Internal Medicine

## 2018-05-27 ENCOUNTER — Other Ambulatory Visit: Payer: Self-pay

## 2018-05-27 DIAGNOSIS — Z7951 Long term (current) use of inhaled steroids: Secondary | ICD-10-CM | POA: Insufficient documentation

## 2018-05-27 DIAGNOSIS — J42 Unspecified chronic bronchitis: Secondary | ICD-10-CM | POA: Diagnosis not present

## 2018-05-27 DIAGNOSIS — J411 Mucopurulent chronic bronchitis: Secondary | ICD-10-CM

## 2018-05-27 DIAGNOSIS — J398 Other specified diseases of upper respiratory tract: Secondary | ICD-10-CM | POA: Insufficient documentation

## 2018-05-27 DIAGNOSIS — Z79899 Other long term (current) drug therapy: Secondary | ICD-10-CM | POA: Insufficient documentation

## 2018-05-27 DIAGNOSIS — R911 Solitary pulmonary nodule: Secondary | ICD-10-CM | POA: Insufficient documentation

## 2018-05-27 HISTORY — PX: FLEXIBLE BRONCHOSCOPY: SHX5094

## 2018-05-27 SURGERY — BRONCHOSCOPY, FLEXIBLE
Anesthesia: Moderate Sedation

## 2018-05-27 MED ORDER — BUTAMBEN-TETRACAINE-BENZOCAINE 2-2-14 % EX AERO
1.0000 | INHALATION_SPRAY | Freq: Once | CUTANEOUS | Status: DC
  Filled 2018-05-27: qty 20

## 2018-05-27 MED ORDER — LACTATED RINGERS IV SOLN: INTRAVENOUS | Status: DC

## 2018-05-27 MED ORDER — PHENYLEPHRINE HCL 0.25 % NA SOLN
1.0000 | Freq: Four times a day (QID) | NASAL | Status: DC | PRN
  Filled 2018-05-27: qty 15

## 2018-05-27 MED ORDER — FENTANYL CITRATE (PF) 100 MCG/2ML IJ SOLN
INTRAMUSCULAR | Status: DC | PRN
  Administered 2018-05-27 (×4): 25 ug via INTRAVENOUS

## 2018-05-27 MED ORDER — LIDOCAINE HCL 2 % EX GEL
1.0000 "application " | Freq: Once | CUTANEOUS | Status: DC
  Filled 2018-05-27: qty 5

## 2018-05-27 MED ORDER — MIDAZOLAM HCL 2 MG/2ML IJ SOLN
INTRAMUSCULAR | Status: AC
  Filled 2018-05-27: qty 8

## 2018-05-27 MED ORDER — FENTANYL CITRATE (PF) 100 MCG/2ML IJ SOLN
INTRAMUSCULAR | Status: AC
  Filled 2018-05-27: qty 4

## 2018-05-27 MED ORDER — MIDAZOLAM HCL 2 MG/2ML IJ SOLN
INTRAMUSCULAR | Status: DC | PRN
  Administered 2018-05-27 (×2): 1 mg via INTRAVENOUS
  Administered 2018-05-27: 2 mg via INTRAVENOUS

## 2018-05-27 NOTE — OR Nursing (Signed)
Cough a lot.  But no bloody sputum noted.  Cautioned that she may note that at home.  Handling her secretions well.  Ice chips to help with cough.

## 2018-05-27 NOTE — Procedures (Signed)
  [facility]            Bronchoscopy Note   FINDINGS/SUMMARY:    Indication: Chronic bronchitis, possible MAC. The patient (or their representative) was informed of the risks (including but not limited to bleeding, infection, respiratory failure, lung injury, tooth/oral injury) and benefits of the procedure and gave consent, see chart.   Pre-op diagnosis: Chronic bronchitis, chronic cough. Post-op diagnosis: Same.  Tracheobronchopathia osteochondroplastica.  Estimated blood loss: minimal  Medications for procedure: Versed 6 mg, Fentanyl 100 mcg.  Procedure description: After obtaining informed consent, timeout was called to confirm the patient and the procedure.  Patient was given anesthetic as per above, total of 6 mg of Versed and 100 mcg of fentanyl, I was present for the duration of the conscious sedation time of approximately 30 minutes. The right and left nares were checked for patency, the right side appeared more patent, this was anesthetized with lidocaine jelly.  The flexible fiberoptic bronchoscope was hence passed through the right naris to the posterior pharynx, normal movements of the vocal cords were visualized.  Topical lidocaine was applied to the vocal cords, the bronchoscope was passed by the vocal cords to the trachea and the main carina.  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 An anatomical tour was then undertaken, all segments were visualized there was moderate mucosal secretions which were suctioned, there was mild mucosal erythema and edema.  All segments were visualized and there was no endobronchial lesions, there were no anatomical abnormalities. The bronchoscope was taken to the right middle lobe transbronchial brushings were taken x2 from the right middle lobe.  Bronchoalveolar lavage was then obtained from the right middle lobe with good  returns.  The bronchoscope was then taken to the lingula, bronchoalveolar lavage was also taken from the lingula with good returns.  Results were sent for cytology and culture.    Condition post procedure: Stable   Complications: None noted   Deep Ashby Dawes, M.D., F.C.C.P. Board Certified in Internal Medicine, Pulmonary Medicine, [place], and Sleep Medicine.  [facility] Office Number: [phone]  05/27/2018

## 2018-05-27 NOTE — Interval H&P Note (Signed)
History and Physical Interval Note:  05/27/2018 11:47 AM  Jane Riley  has presented today for surgery, with the diagnosis of COUGH  The various methods of treatment have been discussed with the patient and family. After consideration of risks, benefits and other options for treatment, the patient has consented to  Procedure(s): FLEXIBLE BRONCHOSCOPY (N/A) as a surgical intervention .  The patient's history has been reviewed, patient examined, no change in status, stable for surgery.  I have reviewed the patient's chart and labs.  Questions were answered to the patient's satisfaction.     [name]

## 2018-05-27 NOTE — Discharge Instructions (Signed)
AMBULATORY SURGERY  DISCHARGE INSTRUCTIONS   1) The drugs that you were given will stay in your system until tomorrow so for the next 24 hours you should not:  A) Drive an automobile B) Make any legal decisions C) Drink any alcoholic beverage   2) You may resume regular meals tomorrow.  Today it is better to start with liquids and gradually work up to solid foods.  You may eat anything you prefer, but it is better to start with liquids, then soup and crackers, and gradually work up to solid foods.   3) Please notify your doctor immediately if you have any unusual bleeding, trouble breathing, redness and pain at the surgery site, drainage, fever, or pain not relieved by medication.    4) Additional Instructions:expect blood tinged sputum   Please contact your physician with any problems or Same Day Surgery at [phone], Monday through Friday 6 am to 4 pm, or [place] [facility] number at [phone].

## 2018-05-28 ENCOUNTER — Encounter: Payer: Self-pay | Admitting: Internal Medicine

## 2018-05-29 LAB — ACID FAST SMEAR (AFB, MYCOBACTERIA)

## 2018-05-29 LAB — ACID FAST SMEAR (AFB): ACID FAST SMEAR - AFSCU2: NEGATIVE

## 2018-05-30 LAB — CULTURE, BAL-QUANTITATIVE: SPECIAL REQUESTS: NORMAL

## 2018-05-30 LAB — CYTOLOGY - NON PAP

## 2018-05-30 LAB — CULTURE, BAL-QUANTITATIVE W GRAM STAIN: Culture: 40000 — AB

## 2018-05-31 DIAGNOSIS — L820 Inflamed seborrheic keratosis: Secondary | ICD-10-CM | POA: Diagnosis not present

## 2018-05-31 DIAGNOSIS — L578 Other skin changes due to chronic exposure to nonionizing radiation: Secondary | ICD-10-CM | POA: Diagnosis not present

## 2018-05-31 DIAGNOSIS — L821 Other seborrheic keratosis: Secondary | ICD-10-CM | POA: Diagnosis not present

## 2018-06-07 NOTE — Progress Notes (Signed)
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 -Etiolog yrelated to chronic bronchitis,  multiple allergies with upper airway cough syndrome, GERD.   --s/p bronchoscopy negative for MAI.  -Patient has undergone empiric measures for cough including cessation of ACE inhibitor, steroid inhaler, antihistamine, antireflux measures, which have been unsuccessful.   --Will continue steroid inhaler.   Lung nodule. - Needs repeat CT chest in approximately 6 months, late 2019, early 2020. - Appears low risk for cancer at this time, but will need continued surveillance.  Meds ordered this encounter  Medications  . Fluticasone Furoate (ARNUITY ELLIPTA) 200 MCG/ACT AEPB    Sig: Inhale 1 puff into the lungs daily.    Dispense:  30 each    Refill:  11   Orders Placed This Encounter  Procedures  . CT Chest Wo Contrast     Return in about 3 months (around 09/07/2018) for after CT chest. .      Date: 06/07/2018  MRN# [id number] STACIA FEAZELL 06-28-1951    IVIS NICOLSON is a 67 y.o. old female seen in consultation for chief complaint of:    Chief Complaint  Patient presents with  . Follow-up    bronch:cough and sob unchanged. Pt states cough worse am and late everning    HPI:   The patient is a 67 year old female with a cough which started in the first week of June, but has been present on and off for about 3 years. At last visit was taken off ace and switched to Norvasc but stopped due to leg swelling. She was started on Arnuity sample which did not help. She has been restarted on lisinopril after a trial off of it which made no difference.   She underwent bronchoscopy on 05/27/18, this showed changes of Tracheobronchopathia osteochondroplastica, chronic bronchitis. Cultures, AFB were negative.    She has a dog, which sleeps in the bed.  She was tested for allergies, she was  allergic to several things, but not the dog. She has completed allergy shots.  She does not snore at night, has never been tested for OSA, she is not sleepy during the day.      **CT chest 03/19/2018>> images personally reviewed, there are tiny bilateral basal nodules, largest of which is 7 mm. **Absolute eosinophil count 05/28/2017>> 100  **PFT 11/23/2016>> tracings personally reviewed, consistent with normal pulmonary functions. SPIROMETRY: FVC was 2.26 liters, 80% of predicted FEV1 was 1.81, 79% of predicted FEV1 ratio was 80 FEF 25-75% liters per second was 81% of predicted  LUNG VOLUMES: TLC was 86% of predicted RV was 98% of predicted  DIFFUSION CAPACITY: DLCO was 83% of predicted DLCO/VA was 153% of predicted  FLOW VOLUME LOOP: Normal   Impression Overall a normal study   Medication:    Current Outpatient Medications:  .  amLODipine (NORVASC) 5 MG tablet, Take 1 tablet (5 mg total) by mouth daily. (Patient not taking: Reported on 05/21/2018), Disp: 30 tablet, Rfl: 5 .  Biotin 5000 MCG TABS, Take 5,000 mcg by mouth daily. , Disp: , Rfl:  .  Cholecalciferol (VITAMIN D3) 1000 units CAPS, Take 1,000 Units by mouth daily. , Disp: , Rfl:  .  citalopram (CELEXA) 20 MG tablet, Take 20 mg by mouth daily., Disp: , Rfl:  .  Coenzyme Q10 (COQ10) 200 MG CAPS, Take 200 mg by mouth daily. , Disp: , Rfl:  .  diphenhydrAMINE (BENADRYL) 25  MG tablet, Take 25 mg by mouth at bedtime as needed for allergies or sleep., Disp: , Rfl:  .  Fluticasone Furoate (ARNUITY ELLIPTA) 100 MCG/ACT AEPB, Inhale 1 puff into the lungs daily. (Patient not taking: Reported on 05/21/2018), Disp: 14 each, Rfl: 0 .  levocetirizine (XYZAL) 5 MG tablet, Take 1 tablet (5 mg total) by mouth every evening., Disp: 30 tablet, Rfl: 0 .  lisinopril-hydrochlorothiazide (PRINZIDE,ZESTORETIC) 10-12.5 MG tablet, Take 1 tablet by mouth daily., Disp: , Rfl:  .  lovastatin (MEVACOR) 20 MG tablet, Take 20 mg by mouth at bedtime.,  Disp: , Rfl:  .  Lysine 1000 MG TABS, Take 1,000 mg by mouth daily as needed (fever blisters). , Disp: , Rfl:  .  Magnesium Oxide 500 MG TABS, Take 500 mg by mouth daily with breakfast. , Disp: , Rfl:  .  Multiple Vitamin (MULTIVITAMIN) tablet, Take 1 tablet by mouth daily., Disp: , Rfl:  .  Omega-3 Fatty Acids (KP FISH OIL) 1200 MG CAPS, Take 1,200 mg by mouth daily. , Disp: , Rfl:  .  omeprazole-sodium bicarbonate (ZEGERID) 40-1100 MG capsule, Take 1 capsule by mouth daily before breakfast., Disp: , Rfl:  .  Probiotic Product (HEALTHY COLON PO), Take 1 capsule by mouth at bedtime. , Disp: , Rfl:  .  raloxifene (EVISTA) 60 MG tablet, Take 60 mg by mouth daily., Disp: , Rfl:  .  Red Yeast Rice 600 MG TABS, Take 1,200 mg by mouth daily., Disp: , Rfl:  .  vitamin E 400 UNIT capsule, Take 400 Units by mouth daily., Disp: , Rfl:    Allergies:  Codeine; Doxycycline; Adhesive [tape]; Amlodipine; Amoxicillin; Nsaids; Oxycodone; and Pravastatin     LABORATORY PANEL:   CBC No results for input(s): WBC, HGB, HCT, PLT in the last 168 hours. ------------------------------------------------------------------------------------------------------------------  Chemistries  No results for input(s): NA, K, CL, CO2, GLUCOSE, BUN, CREATININE, CALCIUM, MG, AST, ALT, ALKPHOS, BILITOT in the last 168 hours.  Invalid input(s): GFRCGP ------------------------------------------------------------------------------------------------------------------  Cardiac Enzymes No results for input(s): TROPONINI in the last 168 hours. ------------------------------------------------------------  RADIOLOGY:  No results found.  Review of Systems:  Constitutional: Feels well. Cardiovascular: No chest pain.  Pulmonary: Denies dyspnea.   The remainder of systems were reviewed and were found to be negative other than what is documented in the HPI.    Physical Examination:   VS: BP 122/72 (BP Location: Left Arm, Cuff  Size: Normal)   Pulse 85   Resp 16   Ht 5\' 4"  (1.626 m)   Wt 150 lb (68 kg)   SpO2 95%   BMI 25.75 kg/m   General Appearance: No distress  Neuro:without focal findings, mental status, speech normal, alert and oriented HEENT: PERRLA, EOM intact Pulmonary: No wheezing, No rales  CardiovascularNormal S1,S2.  No m/r/g.  Abdomen: Benign, Soft, non-tender, No masses Renal:  No costovertebral tenderness  GU:  No performed at this time. Endoc: No evident thyromegaly, no signs of acromegaly or Cushing features Skin:   warm, no rashes, no ecchymosis  Extremities: normal, no cyanosis, clubbing.     Thank  you for the consultation and for allowing [facility] to assist in the care of your patient. Our recommendations are noted above.  Please contact us if we can be of further service.  Marda Stalker, M.D., F.C.C.P.  Board Certified in Internal Medicine, Pulmonary Medicine, [place], and Sleep Medicine.  [facility] Office Number: [phone]   06/07/2018

## 2018-06-08 ENCOUNTER — Inpatient Hospital Stay: Payer: BLUE CROSS/BLUE SHIELD | Admitting: Internal Medicine

## 2018-06-08 ENCOUNTER — Encounter: Payer: Self-pay | Admitting: Internal Medicine

## 2018-06-08 ENCOUNTER — Ambulatory Visit: Payer: BLUE CROSS/BLUE SHIELD | Admitting: Internal Medicine

## 2018-06-08 VITALS — BP 122/72 | HR 85 | Resp 16 | Ht 64.0 in | Wt 150.0 lb

## 2018-06-08 DIAGNOSIS — E782 Mixed hyperlipidemia: Secondary | ICD-10-CM | POA: Diagnosis not present

## 2018-06-08 DIAGNOSIS — R911 Solitary pulmonary nodule: Secondary | ICD-10-CM | POA: Diagnosis not present

## 2018-06-08 DIAGNOSIS — R918 Other nonspecific abnormal finding of lung field: Secondary | ICD-10-CM | POA: Diagnosis not present

## 2018-06-08 DIAGNOSIS — M5412 Radiculopathy, cervical region: Secondary | ICD-10-CM | POA: Diagnosis not present

## 2018-06-08 DIAGNOSIS — R059 Cough, unspecified: Secondary | ICD-10-CM

## 2018-06-08 DIAGNOSIS — J42 Unspecified chronic bronchitis: Secondary | ICD-10-CM | POA: Diagnosis not present

## 2018-06-08 DIAGNOSIS — R05 Cough: Secondary | ICD-10-CM

## 2018-06-08 DIAGNOSIS — R7303 Prediabetes: Secondary | ICD-10-CM | POA: Diagnosis not present

## 2018-06-08 DIAGNOSIS — I10 Essential (primary) hypertension: Secondary | ICD-10-CM | POA: Diagnosis not present

## 2018-06-08 MED ORDER — FLUTICASONE FUROATE 200 MCG/ACT IN AEPB: 1.0000 | INHALATION_SPRAY | Freq: Every day | RESPIRATORY_TRACT | 11 refills | Status: DC

## 2018-06-08 NOTE — Patient Instructions (Addendum)
Will start steroid inhaler (like Arnuity).  Will repeat Ct chest in 3 months and follow up after that.

## 2018-06-14 ENCOUNTER — Ambulatory Visit: Payer: BLUE CROSS/BLUE SHIELD | Admitting: Gastroenterology

## 2018-06-14 ENCOUNTER — Encounter: Payer: Self-pay | Admitting: Gastroenterology

## 2018-06-14 VITALS — BP 125/78 | HR 71 | Ht 64.0 in | Wt 152.4 lb

## 2018-06-14 DIAGNOSIS — K219 Gastro-esophageal reflux disease without esophagitis: Secondary | ICD-10-CM | POA: Diagnosis not present

## 2018-06-14 MED ORDER — PANTOPRAZOLE SODIUM 20 MG PO TBEC: 20.0000 mg | DELAYED_RELEASE_TABLET | Freq: Two times a day (BID) | ORAL | 3 refills | Status: DC

## 2018-06-14 NOTE — Progress Notes (Addendum)
Vonda Antigua, MD [address]  [place]  [place], [place] [address]  Main: [phone]  Fax: [phone]   Primary Care Physician: Dion Body, MD  Primary Gastroenterologist:  Dr. Vonda Antigua  Chief Complaint  Patient presents with  . Follow-up    esophageal dysphagia    HPI: Jane Riley is a 67 y.o. female here for follow up of dysphagia and heartburn. Pt. Has been compliant with Zegerid 30 mins before breakfast.  However, still continuing to report symptoms.  States has symptoms more days of the week than not.  Symptoms are better overall however since she started the medication.  Most of her dysphagia is with pills and not with food.  No weight loss.  No emesis.  No altered bowel habits.  EGD June 2019 Impression:           - Esophageal mucosal changes suggestive of eosinophilic                        esophagitis. Biopsied.                       - Z-line regular.                       - Normal stomach.                       - Normal examined duodenum.  DIAGNOSIS:  A. ESOPHAGUS, DISTAL; COLD BIOPSY:  - STRATIFIED SQUAMOUS EPITHELIUM WITH INTRAEPITHELIAL EOSINOPHILS,  MAXIMUM NUMBER 15 PER HIGH-POWER FIELD.  - NEGATIVE FOR INTRAEPITHELIAL NEUTROPHILS.  - NEGATIVE FOR DYSPLASIA AND MALIGNANCY.   B. ESOPHAGUS, PROXIMAL; COLD BIOPSY:  - STRATIFIED SQUAMOUS EPITHELIUM WITHOUT EOSINOPHILS, NEUTROPHILS, OR  REACTIVE CHANGES.  - NEGATIVE FOR DYSPLASIA AND MALIGNANCY.   Comment:  The low number of distal intraepithelial eosinophils and absence of  proximal eosinophils is more suggestive of reflux esophagitis, but  clinical correlation is recommended.   Current Outpatient Medications  Medication Sig Dispense Refill  . Biotin 5000 MCG TABS Take 5,000 mcg by mouth daily.     . Cholecalciferol (VITAMIN D3) 1000 units CAPS Take 1,000 Units by mouth daily.     . citalopram (CELEXA) 20 MG tablet Take 20 mg by mouth daily.    . Coenzyme Q10 (COQ10)  200 MG CAPS Take 200 mg by mouth daily.     . diphenhydrAMINE (BENADRYL) 25 MG tablet Take 25 mg by mouth at bedtime as needed for allergies or sleep.    Marland Kitchen Fluticasone Furoate (ARNUITY ELLIPTA) 200 MCG/ACT AEPB Inhale into the lungs daily.    Marland Kitchen levocetirizine (XYZAL) 5 MG tablet Take 1 tablet (5 mg total) by mouth every evening. 30 tablet 0  . lisinopril-hydrochlorothiazide (PRINZIDE,ZESTORETIC) 10-12.5 MG tablet Take 1 tablet by mouth daily.    Marland Kitchen lovastatin (MEVACOR) 20 MG tablet Take 20 mg by mouth at bedtime.    Marland Kitchen Lysine 1000 MG TABS Take 1,000 mg by mouth daily as needed (fever blisters).     . Magnesium Oxide 500 MG TABS Take 500 mg by mouth daily with breakfast.     . Multiple Vitamin (MULTIVITAMIN) tablet Take 1 tablet by mouth daily.    . Omega-3 Fatty Acids (KP FISH OIL) 1200 MG CAPS Take 1,200 mg by mouth daily.     . raloxifene (EVISTA) 60 MG tablet Take 60 mg by mouth daily.    Marland Kitchen  Red Yeast Rice 600 MG TABS Take 1,200 mg by mouth daily.    . vitamin E 400 UNIT capsule Take 400 Units by mouth daily.    . pantoprazole (PROTONIX) 20 MG tablet Take 1 tablet (20 mg total) by mouth 2 (two) times daily. 60 tablet 3   No current facility-administered medications for this visit.     Allergies as of 06/14/2018 - Review Complete 06/14/2018  Allergen Reaction Noted  . Codeine Nausea And Vomiting and Nausea Only 02/20/2014  . Doxycycline  03/10/2018  . Adhesive [tape] Other (See Comments) 01/05/2017  . Amlodipine Swelling 05/21/2018  . Amoxicillin Rash 11/29/2014  . Nsaids Other (See Comments) 02/20/2014  . Oxycodone Itching 05/21/2018  . Pravastatin Other (See Comments) 02/20/2014    ROS:  General: Negative for anorexia, weight loss, fever, chills, fatigue, weakness. ENT: Negative for hoarseness, difficulty swallowing , nasal congestion. CV: Negative for chest pain, angina, palpitations, dyspnea on exertion, peripheral edema.  Respiratory: Negative for dyspnea at rest, dyspnea on  exertion, cough, sputum, wheezing.  GI: See history of present illness. GU:  Negative for dysuria, hematuria, urinary incontinence, urinary frequency, nocturnal urination.  Endo: Negative for unusual weight change.    Physical Examination:   BP 125/78   Pulse 71   Ht 5\' 4"  (1.626 m)   Wt 152 lb 6.4 oz (69.1 kg)   BMI 26.16 kg/m   General: Well-nourished, well-developed in no acute distress.  Eyes: No icterus. Conjunctivae pink. Mouth: Oropharyngeal mucosa moist and pink , no lesions erythema or exudate. Neck: Supple, Trachea midline Abdomen: Bowel sounds are normal, nontender, nondistended, no hepatosplenomegaly or masses, no abdominal bruits or hernia , no rebound or guarding.   Extremities: No lower extremity edema. No clubbing or deformities. Neuro: Alert and oriented x 3.  Grossly intact. Skin: Warm and dry, no jaundice.   Psych: Alert and cooperative, normal mood and affect.   Labs: CMP     Component Value Date/Time   NA 142 05/28/2017 1120   K 3.7 05/28/2017 1120   CL 100 05/28/2017 1120   CO2 23 05/28/2017 1120   GLUCOSE 108 (H) 05/28/2017 1120   BUN 13 05/28/2017 1120   CREATININE 0.76 05/28/2017 1120   CALCIUM 9.5 05/28/2017 1120   PROT 6.8 05/28/2017 1120   ALBUMIN 4.7 05/28/2017 1120   AST 28 05/28/2017 1120   ALT 19 05/28/2017 1120   ALKPHOS 53 05/28/2017 1120   BILITOT 0.3 05/28/2017 1120   GFRNONAA 83 05/28/2017 1120   GFRAA 96 05/28/2017 1120   Lab Results  Component Value Date   WBC 11.6 (H) 07/03/2017   HGB 12.9 07/03/2017   HCT 38.1 07/03/2017   MCV 91 07/03/2017   PLT 167 07/03/2017    Imaging Studies: No results found.  Assessment and Plan:   Jane Riley is a 67 y.o. y/o female with intermittent dysphagia to pills, and heartburn, currently on Zegerid once daily  Patient Zegerid has a dose of omeprazole 40 mg in it We will change Zegerid to where omeprazole is 20 mg, and have patient take Zegerid twice daily that way, patient is  getting 20 mg in the morning and in the evening She verbalized understanding of this change If symptoms do not improve, will increase medication to 40 mg twice daily  EGD findings with biopsies, and symptoms are most consistent with reflux  (Risks of PPI use were discussed with patient including bone loss, C. Diff diarrhea, pneumonia, infections, CKD, electrolyte abnormalities.  If  clinically possible based on symptoms, goal would be to maintain patient on the lowest dose possible, or discontinue the medication with institution of acid reflux lifestyle modifications over time. Pt. Verbalizes understanding and chooses to continue the medication.)  Patient educated extensively on acid reflux lifestyle modification, including buying a bed wedge, not eating 3 hrs before bedtime, diet modifications, and handout given for the same.   Patient inquired if she can have her repeat colonoscopy done with our group Last colonoscopy was in 2018 by Dr. Vira Agar We will try to obtain the report She states she was recommended to have repeat in 5 years and has been having colonoscopies every 5 years with Dr. Vira Agar  Colonoscopy report obtained, last done in September 2018, Noted to be tortuous and difficult and proximal sigmoid/descending colon.  Small diverticuli noted in the sigmoid colon.  Internal hemorrhoid noted.  It also reports cecum reached with certainty, but picture not done.  Repeat colonoscopy recommended in 5 years. Report sent for scanning   Dr Vonda Antigua

## 2018-06-14 NOTE — Patient Instructions (Signed)
Discontinue Zegerid-interaction with citolopram, start Protonix 20mg . Daily F/u 3-6 months.

## 2018-06-15 ENCOUNTER — Encounter: Payer: Self-pay | Admitting: Unknown Physician Specialty

## 2018-06-16 ENCOUNTER — Encounter: Payer: Self-pay | Admitting: Unknown Physician Specialty

## 2018-06-17 DIAGNOSIS — I10 Essential (primary) hypertension: Secondary | ICD-10-CM | POA: Diagnosis not present

## 2018-06-17 DIAGNOSIS — Z0000 Encounter for general adult medical examination without abnormal findings: Secondary | ICD-10-CM | POA: Diagnosis not present

## 2018-06-17 DIAGNOSIS — E782 Mixed hyperlipidemia: Secondary | ICD-10-CM | POA: Diagnosis not present

## 2018-06-17 DIAGNOSIS — R7303 Prediabetes: Secondary | ICD-10-CM | POA: Diagnosis not present

## 2018-06-19 LAB — CULTURE, FUNGUS WITHOUT SMEAR: Special Requests: NORMAL

## 2018-06-22 DIAGNOSIS — M8588 Other specified disorders of bone density and structure, other site: Secondary | ICD-10-CM | POA: Diagnosis not present

## 2018-06-22 DIAGNOSIS — M85852 Other specified disorders of bone density and structure, left thigh: Secondary | ICD-10-CM | POA: Insufficient documentation

## 2018-06-24 DIAGNOSIS — M85852 Other specified disorders of bone density and structure, left thigh: Secondary | ICD-10-CM | POA: Diagnosis not present

## 2018-07-08 DIAGNOSIS — Z6825 Body mass index (BMI) 25.0-25.9, adult: Secondary | ICD-10-CM | POA: Diagnosis not present

## 2018-07-08 DIAGNOSIS — M961 Postlaminectomy syndrome, not elsewhere classified: Secondary | ICD-10-CM | POA: Diagnosis not present

## 2018-07-08 DIAGNOSIS — I10 Essential (primary) hypertension: Secondary | ICD-10-CM | POA: Diagnosis not present

## 2018-07-10 LAB — ACID FAST CULTURE WITH REFLEXED SENSITIVITIES (MYCOBACTERIA)

## 2018-07-10 LAB — ACID FAST CULTURE WITH REFLEXED SENSITIVITIES: ACID FAST CULTURE - AFSCU3: NEGATIVE

## 2018-07-14 ENCOUNTER — Encounter: Payer: Self-pay | Admitting: Unknown Physician Specialty

## 2018-08-25 DIAGNOSIS — J301 Allergic rhinitis due to pollen: Secondary | ICD-10-CM | POA: Diagnosis not present

## 2018-08-25 DIAGNOSIS — K219 Gastro-esophageal reflux disease without esophagitis: Secondary | ICD-10-CM | POA: Diagnosis not present

## 2018-08-25 DIAGNOSIS — H9209 Otalgia, unspecified ear: Secondary | ICD-10-CM | POA: Diagnosis not present

## 2018-08-26 ENCOUNTER — Telehealth: Payer: Self-pay | Admitting: Internal Medicine

## 2018-08-26 DIAGNOSIS — M961 Postlaminectomy syndrome, not elsewhere classified: Secondary | ICD-10-CM | POA: Diagnosis not present

## 2018-08-26 NOTE — Telephone Encounter (Signed)
CT Chest scheduled for 09/07/18 at 2:20 at [address] ([place] close to [facility] Pt to arrive at 2:20. No prep.   Called and left message on VM for pt to return my call about CT and to schedule F/U appointment. Rhonda J Cobb

## 2018-08-26 NOTE — Telephone Encounter (Signed)
Please call regarding CT being scheduled.

## 2018-08-27 NOTE — Telephone Encounter (Signed)
Called and spoke with patient and advised patient of CT Chest appointment at [place] at [address] on 09/07/18 at 2:40 to arrive at 2:20. No prep. F/U appointment scheduled with Dr. Ashby Dawes for 09/09/18 at 8:30. Pt is aware of above. Nothing else needed at this time. Rhonda J Cobb

## 2018-09-04 ENCOUNTER — Other Ambulatory Visit: Payer: Self-pay | Admitting: Gastroenterology

## 2018-09-07 ENCOUNTER — Ambulatory Visit
Admission: RE | Admit: 2018-09-07 | Discharge: 2018-09-07 | Disposition: A | Discharge status: Home / Self Care | Payer: BLUE CROSS/BLUE SHIELD | Source: Ambulatory Visit | Attending: Internal Medicine | Admitting: Internal Medicine

## 2018-09-07 DIAGNOSIS — R918 Other nonspecific abnormal finding of lung field: Secondary | ICD-10-CM | POA: Diagnosis not present

## 2018-09-08 NOTE — Progress Notes (Signed)
[facility] Consultation      Assessment and Plan:  Chronic cough with chronic bronchitis.  Tracheophathia osteochondroplastica (Mild).  -Etiology related to chronic bronchitis,  multiple allergies with upper airway cough syndrome, GERD.   --s/p bronchoscopy negative for MAI.  -Patient has undergone empiric measures for cough including cessation of ACE inhibitor, steroid inhaler, antihistamine, antireflux measures, which have been unsuccessful.   -Patient has been maintained on steroid inhaler, this is stopped because of hoarseness.  No signs of thrush.  Hoarseness of voice with chronic rhinitis. - Patient is asked to continue Flonase, she is asked to stop Arnuity. - Possible sinus infection, will treat with azithromycin (allergies to amoxicillin, doxycycline).  Lung nodule. - Tiny lung nodules, largest 6 mm.  Appear low risk for cancer, unchanged in 6 months. - Repeat CT chest in 1 year.  Meds ordered this encounter  Medications  . azithromycin (ZITHROMAX) 250 MG tablet    Sig: Take 1 tablet (250 mg total) by mouth daily. Take 2 tablets the first day, then once daily until gone.    Dispense:  6 tablet    Refill:  0   Orders Placed This Encounter  Procedures  . CT CHEST WO CONTRAST     Return in about 1 year (around 09/10/2019).      Date: 09/08/2018  MRN# [id number] Jane Riley [birth year]    Jane Riley is a 67 y.o. old female seen in consultation for chief complaint of:    No chief complaint on file.   HPI:   The patient is a 67 year old female with a cough which started in the first week of June, but has been present on and off for about 3 years. At last visit was taken off ace and switched to Norvasc but stopped due to leg swelling. She was started on Arnuity sample which did not help. She has been restarted on lisinopril after a trial off of it which made no difference.   She underwent bronchoscopy on 05/27/18, this showed changes of  Tracheobronchopathia osteochondroplastica, chronic bronchitis. Cultures, AFB were negative.   Today she notes that she has been having shortness of breath, she feels that her breathing has been doing well. She feels that her throat is sore and tight. She continues to have an occasional cough. She remains on arnuity, flonase.    She has a dog, which sleeps in the bed.  She was tested for allergies, she was allergic to several things, but not the dog. She has completed allergy shots.  She does not snore at night, has never been tested for OSA, she is not sleepy during the day.     **CT chest 09/07/2018>> images personally reviewed, in comparison with previous on 03/19/2018 there remain tiny bilateral nodules, largest of which is about 6 mm, not significantly changed from previous. **CT chest 03/19/2018>> images personally reviewed, there are tiny bilateral basal nodules, largest of which is 7 mm. **Absolute eosinophil count 05/28/2017>> 100  **PFT 11/23/2016>> tracings personally reviewed, consistent with normal pulmonary functions. SPIROMETRY: FVC was 2.26 liters, 80% of predicted FEV1 was 1.81, 79% of predicted FEV1 ratio was 80 FEF 25-75% liters per second was 81% of predicted  LUNG VOLUMES: TLC was 86% of predicted RV was 98% of predicted  DIFFUSION CAPACITY: DLCO was 83% of predicted DLCO/VA was 153% of predicted  FLOW VOLUME LOOP: Normal   Impression Overall a normal study   Medication:    Current Outpatient Medications:  .  Biotin  5000 MCG TABS, Take 5,000 mcg by mouth daily. , Disp: , Rfl:  .  Cholecalciferol (VITAMIN D3) 1000 units CAPS, Take 1,000 Units by mouth daily. , Disp: , Rfl:  .  citalopram (CELEXA) 20 MG tablet, Take 20 mg by mouth daily., Disp: , Rfl:  .  Coenzyme Q10 (COQ10) 200 MG CAPS, Take 200 mg by mouth daily. , Disp: , Rfl:  .  diphenhydrAMINE (BENADRYL) 25 MG tablet, Take 25 mg by mouth at bedtime as needed for allergies or sleep., Disp: , Rfl:  .   Fluticasone Furoate (ARNUITY ELLIPTA) 200 MCG/ACT AEPB, Inhale into the lungs daily., Disp: , Rfl:  .  levocetirizine (XYZAL) 5 MG tablet, Take 1 tablet (5 mg total) by mouth every evening., Disp: 30 tablet, Rfl: 0 .  lisinopril-hydrochlorothiazide (PRINZIDE,ZESTORETIC) 10-12.5 MG tablet, Take 1 tablet by mouth daily., Disp: , Rfl:  .  lovastatin (MEVACOR) 20 MG tablet, Take 20 mg by mouth at bedtime., Disp: , Rfl:  .  Lysine 1000 MG TABS, Take 1,000 mg by mouth daily as needed (fever blisters). , Disp: , Rfl:  .  Magnesium Oxide 500 MG TABS, Take 500 mg by mouth daily with breakfast. , Disp: , Rfl:  .  Multiple Vitamin (MULTIVITAMIN) tablet, Take 1 tablet by mouth daily., Disp: , Rfl:  .  Omega-3 Fatty Acids (KP FISH OIL) 1200 MG CAPS, Take 1,200 mg by mouth daily. , Disp: , Rfl:  .  pantoprazole (PROTONIX) 20 MG tablet, Take 1 tablet (20 mg total) by mouth 2 (two) times daily., Disp: 60 tablet, Rfl: 3 .  raloxifene (EVISTA) 60 MG tablet, Take 60 mg by mouth daily., Disp: , Rfl:  .  Red Yeast Rice 600 MG TABS, Take 1,200 mg by mouth daily., Disp: , Rfl:  .  vitamin E 400 UNIT capsule, Take 400 Units by mouth daily., Disp: , Rfl:    Allergies:  Codeine; Doxycycline; Adhesive [tape]; Amlodipine; Amoxicillin; Nsaids; Oxycodone; and Pravastatin   Review of Systems:  Constitutional: Feels well. Cardiovascular: Denies chest pain, exertional chest pain.  Pulmonary: Denies hemoptysis, pleuritic chest pain.   The remainder of systems were reviewed and were found to be negative other than what is documented in the HPI.    Physical Examination:   VS: BP 130/82 (BP Location: Left Arm, Cuff Size: Normal)   Pulse 75   Ht 5\' 4"  (1.626 m)   Wt 152 lb 6.4 oz (69.1 kg)   SpO2 95%   BMI 26.16 kg/m   General Appearance: No distress  Neuro:without focal findings, mental status, speech normal, alert and oriented HEENT: PERRLA, EOM intact, postnasal drip evident.  No evidence of thrush. Pulmonary: No  wheezing, No rales  CardiovascularNormal S1,S2.  No m/r/g.  Abdomen: Benign, Soft, non-tender, No masses Renal:  No costovertebral tenderness  GU:  No performed at this time. Endoc: No evident thyromegaly, no signs of acromegaly or Cushing features Skin:   warm, no rashes, no ecchymosis  Extremities: normal, no cyanosis, clubbing.    LABORATORY PANEL:   CBC No results for input(s): WBC, HGB, HCT, PLT in the last 168 hours. ------------------------------------------------------------------------------------------------------------------  Chemistries  No results for input(s): NA, K, CL, CO2, GLUCOSE, BUN, CREATININE, CALCIUM, MG, AST, ALT, ALKPHOS, BILITOT in the last 168 hours.  Invalid input(s): GFRCGP ------------------------------------------------------------------------------------------------------------------  Cardiac Enzymes No results for input(s): TROPONINI in the last 168 hours. ------------------------------------------------------------  RADIOLOGY:  Ct Chest Wo Contrast  Result Date: 09/08/2018 CLINICAL DATA:  Pulmonary nodules. EXAM: CT CHEST  WITHOUT CONTRAST TECHNIQUE: Multidetector CT imaging of the chest was performed following the standard protocol without IV contrast. COMPARISON:  03/19/2018. FINDINGS: Cardiovascular: Atherosclerotic calcification of the aorta. Heart size within normal limits. No pericardial effusion. Mediastinum/Nodes: No pathologically enlarged mediastinal or axillary lymph nodes. Hilar regions are difficult to evaluate without IV contrast appear grossly unremarkable. Esophagus is grossly unremarkable. Lungs/Pleura: There are solid and ground-glass nodules bilaterally. Largest solid nodule measures 6 mm in the subpleural left lower lobe. These are all unchanged from 03/19/2018. Mild scarring in the right middle lobe and lingula. No pleural fluid. Airway is unremarkable. Upper Abdomen: 12 mm low-attenuation lesion in the dome of the liver is unchanged  but too small to characterize. Cholecystectomy. Visualized portions of the adrenal glands, kidneys, spleen, pancreas, stomach and bowel are grossly unremarkable. No upper abdominal adenopathy. Musculoskeletal: No worrisome lytic or sclerotic lesions. IMPRESSION: 1. Pulmonary nodules are stable from 03/19/2018. Follow-up CT chest without contrast in 12 months could be performed in further evaluation. This recommendation follows the consensus statement: Guidelines for Management of Small Pulmonary Nodules Detected on CT Images: From the Fleischner Society 2017; Radiology 2017; 284:228-243. 2.  Aortic atherosclerosis (ICD10-170.0). Electronically Signed   By: Lorin Picket M.D.   On: 09/08/2018 10:43       Thank  you for the consultation and for allowing [facility] to assist in the care of your patient. Our recommendations are noted above.  Please contact us if we can be of further service.  Marda Stalker, M.D., F.C.C.P.  Board Certified in Internal Medicine, Pulmonary Medicine, [place], and Sleep Medicine.  [facility] Office Number: [phone]   09/08/2018

## 2018-09-09 ENCOUNTER — Ambulatory Visit: Payer: BLUE CROSS/BLUE SHIELD | Admitting: Internal Medicine

## 2018-09-09 ENCOUNTER — Encounter: Payer: Self-pay | Admitting: Internal Medicine

## 2018-09-09 VITALS — BP 130/82 | HR 75 | Ht 64.0 in | Wt 152.4 lb

## 2018-09-09 DIAGNOSIS — R05 Cough: Secondary | ICD-10-CM

## 2018-09-09 DIAGNOSIS — R918 Other nonspecific abnormal finding of lung field: Secondary | ICD-10-CM | POA: Diagnosis not present

## 2018-09-09 DIAGNOSIS — J42 Unspecified chronic bronchitis: Secondary | ICD-10-CM | POA: Diagnosis not present

## 2018-09-09 DIAGNOSIS — R059 Cough, unspecified: Secondary | ICD-10-CM

## 2018-09-09 MED ORDER — AZITHROMYCIN 250 MG PO TABS: 250.0000 mg | ORAL_TABLET | Freq: Every day | ORAL | 0 refills | Status: DC

## 2018-09-09 NOTE — Patient Instructions (Signed)
Will do course of z pack.  Continue flonase.  Stop arnuity.

## 2018-09-15 ENCOUNTER — Ambulatory Visit (INDEPENDENT_AMBULATORY_CARE_PROVIDER_SITE_OTHER): Payer: BLUE CROSS/BLUE SHIELD | Admitting: Gastroenterology

## 2018-09-15 ENCOUNTER — Encounter: Payer: Self-pay | Admitting: Gastroenterology

## 2018-09-15 VITALS — BP 134/76 | HR 85 | Ht 64.0 in | Wt 154.2 lb

## 2018-09-15 DIAGNOSIS — K219 Gastro-esophageal reflux disease without esophagitis: Secondary | ICD-10-CM

## 2018-09-15 DIAGNOSIS — K7689 Other specified diseases of liver: Secondary | ICD-10-CM

## 2018-09-15 MED ORDER — FAMOTIDINE 20 MG PO TABS: 20.0000 mg | ORAL_TABLET | Freq: Two times a day (BID) | ORAL | 1 refills | Status: DC

## 2018-09-15 NOTE — Progress Notes (Signed)
Jane Antigua, MD [address]  [place]  [place], [place] [address]  Main: [phone]  Fax: [phone]   Primary Care Physician: Dion Body, MD  Primary Gastroenterologist:  Dr. Vonda Riley  Chief complaint: Follow-up for GERD  HPI: Jane Riley is a 67 y.o. female here for follow-up of reflux.  Was on Zegerid on last visit in September 2019.  Her medication list now reports Protonix.  She reports her symptoms are well controlled with only 1-2 breakthrough symptoms a week.  No dysphagia.  No weight loss.  No blood in stool.  She has noted mild fecal smearing intermittently.  States sometimes she goes to urinate and will notice fecal smearing from rectum intermittently.  Also reports stress urinary incontinence.  EGD June 2019 Impression: - Esophageal mucosal changes suggestive of eosinophilic  esophagitis. Biopsied. - Z-line regular. - Normal stomach. - Normal examined duodenum.  DIAGNOSIS:  A. ESOPHAGUS, DISTAL; COLD BIOPSY:  - STRATIFIED SQUAMOUS EPITHELIUM WITH INTRAEPITHELIAL EOSINOPHILS,  MAXIMUM NUMBER 15 PER HIGH-POWER FIELD.  - NEGATIVE FOR INTRAEPITHELIAL NEUTROPHILS.  - NEGATIVE FOR DYSPLASIA AND MALIGNANCY.   B. ESOPHAGUS, PROXIMAL; COLD BIOPSY:  - STRATIFIED SQUAMOUS EPITHELIUM WITHOUT EOSINOPHILS, NEUTROPHILS, OR  REACTIVE CHANGES.  - NEGATIVE FOR DYSPLASIA AND MALIGNANCY.   Comment:  The low number of distal intraepithelial eosinophils and absence of  proximal eosinophils is more suggestive of reflux esophagitis, but  clinical correlation is recommended.   Colonoscopy up-to-date, by Dr. Knute Neu done in September 2018, Noted to be tortuous and difficult and proximal sigmoid/descending colon.  Small diverticuli noted in the sigmoid colon.  Internal hemorrhoid noted.  It also reports cecum reached with certainty, but picture  not done.  Repeat colonoscopy recommended in 5 years.   Current Outpatient Medications  Medication Sig Dispense Refill  . azithromycin (ZITHROMAX) 250 MG tablet Take 1 tablet (250 mg total) by mouth daily. Take 2 tablets the first day, then once daily until gone. 6 tablet 0  . Biotin 5000 MCG TABS Take 5,000 mcg by mouth daily.     . Cholecalciferol (VITAMIN D3) 1000 units CAPS Take 1,000 Units by mouth daily.     . citalopram (CELEXA) 20 MG tablet Take 20 mg by mouth daily.    . Coenzyme Q10 (COQ10) 200 MG CAPS Take 200 mg by mouth daily.     . diphenhydrAMINE (BENADRYL) 25 MG tablet Take 25 mg by mouth at bedtime as needed for allergies or sleep.    Marland Kitchen Fluticasone Furoate (ARNUITY ELLIPTA) 200 MCG/ACT AEPB Inhale into the lungs daily.    Marland Kitchen levocetirizine (XYZAL) 5 MG tablet Take 1 tablet (5 mg total) by mouth every evening. 30 tablet 0  . lisinopril-hydrochlorothiazide (PRINZIDE,ZESTORETIC) 10-12.5 MG tablet Take 1 tablet by mouth daily.    Marland Kitchen lovastatin (MEVACOR) 20 MG tablet Take 20 mg by mouth at bedtime.    Marland Kitchen Lysine 1000 MG TABS Take 1,000 mg by mouth daily as needed (fever blisters).     . Magnesium Oxide 500 MG TABS Take 500 mg by mouth daily with breakfast.     . Multiple Vitamin (MULTIVITAMIN) tablet Take 1 tablet by mouth daily.    . Omega-3 Fatty Acids (KP FISH OIL) 1200 MG CAPS Take 1,200 mg by mouth daily.     . pantoprazole (PROTONIX) 20 MG tablet Take 1 tablet (20 mg total) by mouth 2 (two) times daily. 60 tablet 3  . raloxifene (EVISTA) 60 MG tablet Take 60 mg by mouth daily.    Marland Kitchen  Red Yeast Rice 600 MG TABS Take 1,200 mg by mouth daily.    . vitamin E 400 UNIT capsule Take 400 Units by mouth daily.     No current facility-administered medications for this visit.     Allergies as of 09/15/2018 - Review Complete 09/09/2018  Allergen Reaction Noted  . Codeine Nausea And Vomiting and Nausea Only 02/20/2014  . Doxycycline  03/10/2018  . Adhesive [tape] Other (See Comments)  01/05/2017  . Amlodipine Swelling 05/21/2018  . Amoxicillin Rash 11/29/2014  . Nsaids Other (See Comments) 02/20/2014  . Oxycodone Itching 05/21/2018  . Pravastatin Other (See Comments) 02/20/2014    ROS:  General: Negative for anorexia, weight loss, fever, chills, fatigue, weakness. ENT: Negative for hoarseness, difficulty swallowing , nasal congestion. CV: Negative for chest pain, angina, palpitations, dyspnea on exertion, peripheral edema.  Respiratory: Negative for dyspnea at rest, dyspnea on exertion, cough, sputum, wheezing.  GI: See history of present illness. GU:  Negative for dysuria, hematuria, urinary incontinence, urinary frequency, nocturnal urination.  Endo: Negative for unusual weight change.    Physical Examination:  Vitals:   09/15/18 1334  BP: 134/76  Pulse: 85  Weight: 154 lb 3.2 oz (69.9 kg)  Height: 5\' 4"  (1.626 m)    General: Well-nourished, well-developed in no acute distress.  Eyes: No icterus. Conjunctivae pink. Mouth: Oropharyngeal mucosa moist and pink , no lesions erythema or exudate. Neck: Supple, Trachea midline Abdomen: Bowel sounds are normal, nontender, nondistended, no hepatosplenomegaly or masses, no abdominal bruits or hernia , no rebound or guarding.   Extremities: No lower extremity edema. No clubbing or deformities. Neuro: Alert and oriented x 3.  Grossly intact. Skin: Warm and dry, no jaundice.   Psych: Alert and cooperative, normal mood and affect.   Labs: CMP     Component Value Date/Time   NA 142 05/28/2017 1120   K 3.7 05/28/2017 1120   CL 100 05/28/2017 1120   CO2 23 05/28/2017 1120   GLUCOSE 108 (H) 05/28/2017 1120   BUN 13 05/28/2017 1120   CREATININE 0.76 05/28/2017 1120   CALCIUM 9.5 05/28/2017 1120   PROT 6.8 05/28/2017 1120   ALBUMIN 4.7 05/28/2017 1120   AST 28 05/28/2017 1120   ALT 19 05/28/2017 1120   ALKPHOS 53 05/28/2017 1120   BILITOT 0.3 05/28/2017 1120   GFRNONAA 83 05/28/2017 1120   GFRAA 96  05/28/2017 1120   Lab Results  Component Value Date   WBC 11.6 (H) 07/03/2017   HGB 12.9 07/03/2017   HCT 38.1 07/03/2017   MCV 91 07/03/2017   PLT 167 07/03/2017    Imaging Studies: Ct Chest Wo Contrast  Result Date: 09/08/2018 CLINICAL DATA:  Pulmonary nodules. EXAM: CT CHEST WITHOUT CONTRAST TECHNIQUE: Multidetector CT imaging of the chest was performed following the standard protocol without IV contrast. COMPARISON:  03/19/2018. FINDINGS: Cardiovascular: Atherosclerotic calcification of the aorta. Heart size within normal limits. No pericardial effusion. Mediastinum/Nodes: No pathologically enlarged mediastinal or axillary lymph nodes. Hilar regions are difficult to evaluate without IV contrast appear grossly unremarkable. Esophagus is grossly unremarkable. Lungs/Pleura: There are solid and ground-glass nodules bilaterally. Largest solid nodule measures 6 mm in the subpleural left lower lobe. These are all unchanged from 03/19/2018. Mild scarring in the right middle lobe and lingula. No pleural fluid. Airway is unremarkable. Upper Abdomen: 12 mm low-attenuation lesion in the dome of the liver is unchanged but too small to characterize. Cholecystectomy. Visualized portions of the adrenal glands, kidneys, spleen, pancreas, stomach and bowel  are grossly unremarkable. No upper abdominal adenopathy. Musculoskeletal: No worrisome lytic or sclerotic lesions. IMPRESSION: 1. Pulmonary nodules are stable from 03/19/2018. Follow-up CT chest without contrast in 12 months could be performed in further evaluation. This recommendation follows the consensus statement: Guidelines for Management of Small Pulmonary Nodules Detected on CT Images: From the Fleischner Society 2017; Radiology 2017; 284:228-243. 2.  Aortic atherosclerosis (ICD10-170.0). Electronically Signed   By: Lorin Picket M.D.   On: 09/08/2018 10:43    Assessment and Plan:   Jane Riley is a 67 y.o. y/o female with reflux here for  follow-up  reflux symptoms are well controlled We discussed adverse effects of PPI, 1 of them being diarrhea Given her fecal smearing we discussed changing therapy to Pepcid and patient is willing to try this.  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

## 2018-09-24 ENCOUNTER — Telehealth: Payer: Self-pay

## 2018-09-24 ENCOUNTER — Other Ambulatory Visit: Payer: Self-pay

## 2018-09-24 DIAGNOSIS — R151 Fecal smearing: Secondary | ICD-10-CM

## 2018-09-24 DIAGNOSIS — K7689 Other specified diseases of liver: Secondary | ICD-10-CM

## 2018-09-24 DIAGNOSIS — R32 Unspecified urinary incontinence: Secondary | ICD-10-CM

## 2018-09-24 NOTE — Telephone Encounter (Signed)
Left message that referral has been sent and ultrasound scheduled for Jan. 3, 2020. Will send information to My Chart and if questions, may contact office.

## 2018-10-08 ENCOUNTER — Ambulatory Visit
Admission: RE | Admit: 2018-10-08 | Discharge: 2018-10-08 | Disposition: A | Discharge status: Home / Self Care | Payer: BLUE CROSS/BLUE SHIELD | Source: Ambulatory Visit | Attending: Gastroenterology | Admitting: Gastroenterology

## 2018-10-08 DIAGNOSIS — K7689 Other specified diseases of liver: Secondary | ICD-10-CM | POA: Insufficient documentation

## 2018-10-09 ENCOUNTER — Other Ambulatory Visit: Payer: Self-pay | Admitting: Gastroenterology

## 2018-10-12 DIAGNOSIS — Z6826 Body mass index (BMI) 26.0-26.9, adult: Secondary | ICD-10-CM | POA: Diagnosis not present

## 2018-10-12 DIAGNOSIS — M961 Postlaminectomy syndrome, not elsewhere classified: Secondary | ICD-10-CM | POA: Diagnosis not present

## 2018-10-12 DIAGNOSIS — M5412 Radiculopathy, cervical region: Secondary | ICD-10-CM | POA: Diagnosis not present

## 2018-10-12 DIAGNOSIS — I10 Essential (primary) hypertension: Secondary | ICD-10-CM | POA: Diagnosis not present

## 2018-10-14 DIAGNOSIS — I10 Essential (primary) hypertension: Secondary | ICD-10-CM | POA: Diagnosis not present

## 2018-10-14 DIAGNOSIS — E782 Mixed hyperlipidemia: Secondary | ICD-10-CM | POA: Diagnosis not present

## 2018-10-14 DIAGNOSIS — R7303 Prediabetes: Secondary | ICD-10-CM | POA: Diagnosis not present

## 2018-10-21 DIAGNOSIS — E782 Mixed hyperlipidemia: Secondary | ICD-10-CM | POA: Diagnosis not present

## 2018-10-21 DIAGNOSIS — I10 Essential (primary) hypertension: Secondary | ICD-10-CM | POA: Diagnosis not present

## 2018-10-21 DIAGNOSIS — F419 Anxiety disorder, unspecified: Secondary | ICD-10-CM | POA: Diagnosis not present

## 2018-11-01 DIAGNOSIS — H43811 Vitreous degeneration, right eye: Secondary | ICD-10-CM | POA: Diagnosis not present

## 2018-11-14 ENCOUNTER — Other Ambulatory Visit: Payer: Self-pay | Admitting: Gastroenterology

## 2018-11-29 DIAGNOSIS — M5412 Radiculopathy, cervical region: Secondary | ICD-10-CM | POA: Diagnosis not present

## 2018-12-01 ENCOUNTER — Encounter: Payer: Self-pay | Admitting: Physical Therapy

## 2018-12-01 ENCOUNTER — Other Ambulatory Visit: Payer: Self-pay

## 2018-12-01 ENCOUNTER — Ambulatory Visit: Payer: BLUE CROSS/BLUE SHIELD | Attending: Gastroenterology | Admitting: Physical Therapy

## 2018-12-01 DIAGNOSIS — R278 Other lack of coordination: Secondary | ICD-10-CM

## 2018-12-01 DIAGNOSIS — M62838 Other muscle spasm: Secondary | ICD-10-CM | POA: Diagnosis not present

## 2018-12-01 DIAGNOSIS — M542 Cervicalgia: Secondary | ICD-10-CM | POA: Diagnosis not present

## 2018-12-01 DIAGNOSIS — G8929 Other chronic pain: Secondary | ICD-10-CM | POA: Diagnosis not present

## 2018-12-01 NOTE — Patient Instructions (Addendum)
Drink luke/ warm water first thing in the morning.   Decrease coffee from 1 cup of coffee in the morning instead of 2 cups.     ___ Morning and evening   Open book (handout)   Abdominal massage upward from L,  R , center to belly button 3 stroke x 3 , pressure is gentle and light with all fingers flat, not using finger tips   _____  On your work breaks:  Spinal stretches  6 directions of the spine :  5 reps   Rotation: armswings to rotate the spine  Side flexion: arm overhead arching like a rainbow , both sides  Forward bend and strainghtening up: minisquat-  Like you are about to sit and then stand up with a slight lift in the chest   ___     Slow paced breaths 3 and belly massage before and after meals

## 2018-12-02 NOTE — Therapy (Addendum)
[place] MAIN [facility] SERVICES [address] [place], [place], [address] Phone: [phone]   Fax:  [phone]  Physical Therapy Evaluation  Patient Details  Name: Jane Riley MRN: [id number] Date of Birth: 07-Mar-1951 Referring Provider (PT): Bonna Gains    Encounter Date: 12/01/2018  PT End of Session - 12/01/18 1318    Visit Number  1    Number of Visits  10    Date for PT Re-Evaluation  02/09/19    PT Start Time  3785    PT Stop Time  1412    PT Time Calculation (min)  54 min       Past Medical History:  Diagnosis Date  . Arthritis    neck, hands  . CRPS (complex regional pain syndrome)   . Depression   . Family history of adverse reaction to anesthesia    mother nausea and vomiting  . GERD (gastroesophageal reflux disease)   . Hypercholesteremia   . Hypertension   . Osteoporosis   . PONV (postoperative nausea and vomiting)     Past Surgical History:  Procedure Laterality Date  . ABDOMINAL HYSTERECTOMY    . CATARACT EXTRACTION W/PHACO Right 01/12/2017   Procedure: CATARACT EXTRACTION PHACO AND INTRAOCULAR LENS PLACEMENT (IOC) Right Restor Lens;  Surgeon: Ronnell Freshwater, MD;  Location: [facility];  Service: Ophthalmology;  Laterality: Right;  Restor Lens  . CESAREAN SECTION     x2  . CHOLECYSTECTOMY    . ESOPHAGOGASTRODUODENOSCOPY (EGD) WITH PROPOFOL N/A 03/24/2018   Procedure: ESOPHAGOGASTRODUODENOSCOPY (EGD) WITH PROPOFOL;  Surgeon: Virgel Manifold, MD;  Location: [facility];  Service: Endoscopy;  Laterality: N/A;  . FLEXIBLE BRONCHOSCOPY N/A 05/27/2018   Procedure: FLEXIBLE BRONCHOSCOPY;  Surgeon: [name], MD;  Location: [facility];  Service: Pulmonary;  Laterality: N/A;  . NECK SURGERY  2008   4 level fusion.  plates and screws  . TONSILLECTOMY    . WRIST SURGERY Left 2014   implant but unsure of the type    There were no vitals filed for this visit.   Subjective Assessment -  12/01/18 1327    Subjective 1) frequent bowel movements up to: 4 x day.  Across one week, 2x BM per day. Stool Type 5-7 . Type 4 once every 2 weeks.    2) fecal incontinence with urge and also after urination .  These Sx have occurred for the past 3 months.  The loose stools have been going on for 1 year.  Pt has been working with her GI MD to change acid reflux medication in case of the side effects are related to loose stools.  Daily fluid intake: 2 ( 32 fl oz) per day of water,  2-3 cups of coffee.     3) SUI  since hysterectomy 1990.  Denied LBP .   Occupation with long hours of sitting.     4) neck pain with working. pt notices she tenses up. 4/10. nubness/ tingling in B arms.  2008 4 level disk fusion with PT.        Pertinent History  ABDOMINAL HYSTERECTOMY. C-section x 2. Miscarriage.  gall bladder removal. 4 level cervical disk fusion     Patient Stated Goals  get this under control          [facility] PT Assessment - 12/02/18 2033      Assessment   Medical Diagnosis  fecal incontinence     Referring Provider (PT)  Bonna Gains  Precautions   Precautions  None      Restrictions   Weight Bearing Restrictions  No      Balance Screen   Has the patient fallen in the past 6 months  No      [place] residence      Prior Function   Level of Independence  Independent      Observation/Other Assessments   Observations  legs crossed , forward head     Coordination   Gross Motor Movements are Fluid and Coordinated  --   limited diaphragamtic excursion     Palpation   Spinal mobility  25% B rotation and sideflexion     SI assessment   equal alignment , R ASIS more anterior     Palpation comment  signficant scar restrictions over abdomen,  noted scar anterior neck, plan to assess neck further at next session                 Objective measurements completed on examination: See above findings.    Pelvic Floor Special Questions -  12/02/18 2034    Diastasis Recti  neg        [facility] Adult PT Treatment/Exercise - 12/02/18 2035      Neuro Re-ed    Neuro Re-ed Details   see pt instructions, cued for proper diaphagmatic and deep core, bed mobility to minimzie strain on pelvic floor       Manual Therapy   Manual therapy comments  abdominal scar releases with MWM, MWM/ STM at thoracic / scap to promote spinal mobility                   PT Long Term Goals - 12/01/18 1329      PT LONG TERM GOAL #1   Title  Pt will report having improved stool type 5-7 to Type 4 across 50% of the time in order to imprvoe GI function    Time  10    Period  Weeks    Status  New    Target Date  02/09/19      PT LONG TERM GOAL #2   Title  Pt will decreased NDI score from 12% to < 5% in order to improve ADLs    Time  10    Period  Weeks    Status  New    Target Date  02/10/19      PT LONG TERM GOAL #3   Title  Pt will decrease her PFIQ-7 Score from 11% to < 5% in order to improve pelvic floor function and participate in activities in the community    Time  8    Period  Weeks    Status  New    Target Date  01/27/19      PT LONG TERM GOAL #4   Title  Pt will demo decreased abdominal scar restrictions and proper pelvic floor lengthening in order to progress to pelvic floor and deep core coordination training    Time  4    Period  Weeks    Status  New    Target Date  12/30/18             Plan - 12/02/18 0837    Clinical Impression Statement  Pt is a 68 yo female who reports frequent bowel movements, fecal incontinence with urge and with urination, SUI, and neck pain.  Contributing factors to her Sx include her surgerical Hx of  abdominal hysterectomy, 2 C-section, gall bladder removal, and 4 level cervical disk fusion. Clinical presentation includes restricted abdominal scars, pelvic girdle malalignment, spinal mobility limitation, and poor posture. Today, manual Tx was applied to improve abdominal scar mobility,   pelvic girdle mobility, and spinal mobility. Pt demo'd increased spinal mobility and more equally aligned pelvic girdle and was progressed to deep core coordination.   PLan to assess pelvic floor and neck at upcoming sessions.       Clinical Presentation  Stable    Clinical Decision Making  Moderate    PT Frequency  1x / week    PT Duration  Other (comment)   10   PT Treatment/Interventions  Therapeutic activities;Therapeutic exercise;Patient/family education;Neuromuscular re-education;Moist Heat;Stair training;Gait training;Manual techniques;Functional mobility training;Scar mobilization;Electrical Stimulation;Taping;Aquatic Therapy    Consulted and Agree with Plan of Care  Patient       Patient will benefit from skilled therapeutic intervention in order to improve the following deficits and impairments:  Improper body mechanics, Pain, Increased fascial restricitons, Postural dysfunction, Increased muscle spasms, Decreased coordination, Decreased mobility, Decreased activity tolerance, Decreased balance, Difficulty walking, Decreased range of motion, Decreased endurance, Decreased scar mobility, Decreased strength  Visit Diagnosis: Other muscle spasm  Neck pain, chronic  Other lack of coordination     Problem List Patient Active Problem List   Diagnosis Date Noted  . Esophageal dysphagia   . Feline esophagus   . Nuclear sclerotic cataract of left eye 06/25/2017  . Presbyopia 06/25/2017  . Shingles 05/28/2017  . Anxiety 05/20/2017  . Essential hypertension 05/20/2017  . Gastroesophageal reflux disease without esophagitis 05/20/2017  . Mixed hyperlipidemia 05/20/2017  . Allergic rhinitis 12/15/2016  . Vaccine counseling 01/07/2016    Jerl Mina ,PT, DPT, E-RYT  12/02/2018, 8:38 PM  [place] MAIN [facility] SERVICES [address] [place], [place], [address] Phone: [phone]   Fax:  [phone]  Name: Jane Riley MRN:  [id number] Date of Birth: 1951-08-25

## 2018-12-03 DIAGNOSIS — H43811 Vitreous degeneration, right eye: Secondary | ICD-10-CM | POA: Diagnosis not present

## 2018-12-07 ENCOUNTER — Other Ambulatory Visit: Payer: Self-pay | Admitting: Gastroenterology

## 2018-12-08 ENCOUNTER — Ambulatory Visit: Payer: BLUE CROSS/BLUE SHIELD | Attending: Gastroenterology | Admitting: Physical Therapy

## 2018-12-08 DIAGNOSIS — G8929 Other chronic pain: Secondary | ICD-10-CM | POA: Diagnosis not present

## 2018-12-08 DIAGNOSIS — R278 Other lack of coordination: Secondary | ICD-10-CM | POA: Diagnosis not present

## 2018-12-08 DIAGNOSIS — M542 Cervicalgia: Secondary | ICD-10-CM | POA: Insufficient documentation

## 2018-12-08 DIAGNOSIS — M62838 Other muscle spasm: Secondary | ICD-10-CM | POA: Diagnosis not present

## 2018-12-08 NOTE — Therapy (Signed)
[place] MAIN [facility] SERVICES [address] [place], [place], [address] Phone: [phone]   Fax:  [phone]  Physical Therapy Treatment  Patient Details  Name: Jane Riley MRN: [id number] Date of Birth: July 27, 1951 Referring Provider (PT): Bonna Gains    Encounter Date: 12/08/2018  PT End of Session - 12/08/18 1345    Visit Number  2    Number of Visits  10    Date for PT Re-Evaluation  02/09/19    PT Start Time  2774    PT Stop Time  1350    PT Time Calculation (min)  46 min       Past Medical History:  Diagnosis Date  . Arthritis    neck, hands  . CRPS (complex regional pain syndrome)   . Depression   . Family history of adverse reaction to anesthesia    mother nausea and vomiting  . GERD (gastroesophageal reflux disease)   . Hypercholesteremia   . Hypertension   . Osteoporosis   . PONV (postoperative nausea and vomiting)     Past Surgical History:  Procedure Laterality Date  . ABDOMINAL HYSTERECTOMY    . CATARACT EXTRACTION W/PHACO Right 01/12/2017   Procedure: CATARACT EXTRACTION PHACO AND INTRAOCULAR LENS PLACEMENT (IOC) Right Restor Lens;  Surgeon: Ronnell Freshwater, MD;  Location: [facility];  Service: Ophthalmology;  Laterality: Right;  Restor Lens  . CESAREAN SECTION     x2  . CHOLECYSTECTOMY    . ESOPHAGOGASTRODUODENOSCOPY (EGD) WITH PROPOFOL N/A 03/24/2018   Procedure: ESOPHAGOGASTRODUODENOSCOPY (EGD) WITH PROPOFOL;  Surgeon: Virgel Manifold, MD;  Location: [facility];  Service: Endoscopy;  Laterality: N/A;  . FLEXIBLE BRONCHOSCOPY N/A 05/27/2018   Procedure: FLEXIBLE BRONCHOSCOPY;  Surgeon: [name], MD;  Location: [facility];  Service: Pulmonary;  Laterality: N/A;  . NECK SURGERY  2008   4 level fusion.  plates and screws  . TONSILLECTOMY    . WRIST SURGERY Left 2014   implant but unsure of the type    There were no vitals filed for this visit.  Subjective Assessment -  12/08/18 1306    Subjective  Pt had no compliants. Pt reports the middle of her shoulder blade has been hurting for a couple of days. It radiates down from bottom neck to shoulder blade area. 3-4/10 .    Pertinent History  ABDOMINAL HYSTERECTOMY. C-section x 2. Miscarriage.  gall bladder removal. 4 level cervical disk fusion     Patient Stated Goals  get this under control          [facility] PT Assessment - 12/08/18 1308      AROM   Overall AROM Comments  40 deg B rotation cervical, ext 50 deg, flex 45 deg, R cervical rotation 20 deg, L 30 deg        Palpation   Palpation comment  increased tightness at posterior scalenes, anterior neck L, levator scapula L                     [facility] Adult PT Treatment/Exercise - 12/08/18 1343      Exercises   Exercises  --   cervical ROM      Manual Therapy   Manual therapy comments  distraction at occiput, C7/T1 junction, STM/ MWM , inferior mob at 1st rib and scap spine, STM at mm noted in assessment  PT Long Term Goals - 12/08/18 1310      PT LONG TERM GOAL #1   Title  Pt will report having improved stool type 5-7 to Type 4 across 50% of the time in order to imprvoe GI function    Time  10    Period  Weeks    Status  New      PT LONG TERM GOAL #2   Title  Pt will decreased NDI score from 12% to < 5% in order to improve ADLs    Time  10    Period  Weeks    Status  New      PT LONG TERM GOAL #3   Title  Pt will decrease her PFIQ-7 Score from 11% to < 5% in order to improve pelvic floor function and participate in activities in the community    Time  8    Period  Weeks    Status  New      PT LONG TERM GOAL #4   Title  Pt will demo decreased abdominal scar restrictions and proper pelvic floor lengthening in order to progress to pelvic floor and deep core coordination training    Time  4    Period  Weeks    Status  New      PT LONG TERM GOAL #5   Title  Pt will increase cervical rotation B to >  45 deg from ( R cervical rotation 20 deg, L 30 deg ) in order to drive    Time  8    Period  Weeks    Status  New    Target Date  02/02/19            Plan - 12/08/18 1345    Clinical Impression Statement  Pt demo'd good carry over with deep core coordination from last session. Addressed neck issues today which showed signficantly limited AROM with rotation B.  Following manual Tx, pt's rotation AROM increased with decreased mm tensions of neck and shoulder mm. Pt continues to benefit from skilled PT    PT Frequency  1x / week    PT Duration  Other (comment)   10   PT Treatment/Interventions  Therapeutic activities;Therapeutic exercise;Patient/family education;Neuromuscular re-education;Moist Heat;Stair training;Gait training;Manual techniques;Functional mobility training;Scar mobilization;Electrical Stimulation;Taping;Aquatic Therapy    Consulted and Agree with Plan of Care  Patient       Patient will benefit from skilled therapeutic intervention in order to improve the following deficits and impairments:  Improper body mechanics, Pain, Increased fascial restricitons, Postural dysfunction, Increased muscle spasms, Decreased coordination, Decreased mobility, Decreased activity tolerance, Decreased balance, Difficulty walking, Decreased range of motion, Decreased endurance, Decreased scar mobility, Decreased strength  Visit Diagnosis: Neck pain, chronic  Other muscle spasm  Other lack of coordination     Problem List Patient Active Problem List   Diagnosis Date Noted  . Esophageal dysphagia   . Feline esophagus   . Nuclear sclerotic cataract of left eye 06/25/2017  . Presbyopia 06/25/2017  . Shingles 05/28/2017  . Anxiety 05/20/2017  . Essential hypertension 05/20/2017  . Gastroesophageal reflux disease without esophagitis 05/20/2017  . Mixed hyperlipidemia 05/20/2017  . Allergic rhinitis 12/15/2016  . Vaccine counseling 01/07/2016    Jerl Mina ,PT, DPT,  E-RYT  12/08/2018, 1:46 PM  [place] MAIN [facility] SERVICES [address] [place], [place], [address] Phone: [phone]   Fax:  [phone]  Name: Jane Riley MRN: [id number] Date of Birth:  06/14/1951   

## 2018-12-08 NOTE — Patient Instructions (Addendum)
elongating the neck    interlace fingers, thumb pads under nook of base of skull,  elbows move towards each other and move palms up away from shoulders while  press head against palms,     6 directions of neck laying down   At work  neck stretch ( looking into armpit and upper corner)   by the wall, palm on wall  bear stretch by shoulder blade at doorway

## 2018-12-09 NOTE — Addendum Note (Signed)
Addended by: Jerl Mina on: 12/09/2018 12:19 PM   Modules accepted: Orders

## 2018-12-15 ENCOUNTER — Ambulatory Visit: Payer: BLUE CROSS/BLUE SHIELD | Admitting: Physical Therapy

## 2018-12-20 ENCOUNTER — Telehealth: Payer: Self-pay | Admitting: Internal Medicine

## 2018-12-20 NOTE — Telephone Encounter (Signed)
Spoke to patient, let her know if she has the ability to work from home, then yes, she should. Patient understands. Nothing further needed at this time.

## 2018-12-22 ENCOUNTER — Ambulatory Visit: Payer: BLUE CROSS/BLUE SHIELD | Admitting: Physical Therapy

## 2018-12-27 DIAGNOSIS — D229 Melanocytic nevi, unspecified: Secondary | ICD-10-CM | POA: Diagnosis not present

## 2018-12-27 DIAGNOSIS — D485 Neoplasm of uncertain behavior of skin: Secondary | ICD-10-CM | POA: Diagnosis not present

## 2018-12-27 DIAGNOSIS — Z1283 Encounter for screening for malignant neoplasm of skin: Secondary | ICD-10-CM | POA: Diagnosis not present

## 2018-12-27 DIAGNOSIS — L249 Irritant contact dermatitis, unspecified cause: Secondary | ICD-10-CM | POA: Diagnosis not present

## 2018-12-27 DIAGNOSIS — L820 Inflamed seborrheic keratosis: Secondary | ICD-10-CM | POA: Diagnosis not present

## 2018-12-29 ENCOUNTER — Ambulatory Visit: Payer: BLUE CROSS/BLUE SHIELD | Admitting: Physical Therapy

## 2018-12-30 ENCOUNTER — Encounter: Payer: BLUE CROSS/BLUE SHIELD | Admitting: Physical Therapy

## 2019-01-05 ENCOUNTER — Ambulatory Visit: Payer: BLUE CROSS/BLUE SHIELD | Admitting: Physical Therapy

## 2019-01-12 ENCOUNTER — Ambulatory Visit: Payer: BLUE CROSS/BLUE SHIELD | Admitting: Physical Therapy

## 2019-01-19 ENCOUNTER — Ambulatory Visit: Payer: BLUE CROSS/BLUE SHIELD | Admitting: Physical Therapy

## 2019-01-26 ENCOUNTER — Ambulatory Visit: Payer: BLUE CROSS/BLUE SHIELD | Admitting: Physical Therapy

## 2019-01-26 DIAGNOSIS — M5412 Radiculopathy, cervical region: Secondary | ICD-10-CM | POA: Diagnosis not present

## 2019-01-26 DIAGNOSIS — M961 Postlaminectomy syndrome, not elsewhere classified: Secondary | ICD-10-CM | POA: Diagnosis not present

## 2019-03-16 DIAGNOSIS — H5711 Ocular pain, right eye: Secondary | ICD-10-CM | POA: Diagnosis not present

## 2019-03-17 ENCOUNTER — Other Ambulatory Visit: Payer: Self-pay | Admitting: Family Medicine

## 2019-03-17 DIAGNOSIS — Z1231 Encounter for screening mammogram for malignant neoplasm of breast: Secondary | ICD-10-CM

## 2019-03-23 ENCOUNTER — Other Ambulatory Visit: Payer: Self-pay

## 2019-03-23 ENCOUNTER — Ambulatory Visit
Admission: RE | Admit: 2019-03-23 | Discharge: 2019-03-23 | Disposition: A | Discharge status: Home / Self Care | Payer: BC Managed Care – PPO | Source: Ambulatory Visit | Attending: Family Medicine | Admitting: Family Medicine

## 2019-03-23 DIAGNOSIS — Z1231 Encounter for screening mammogram for malignant neoplasm of breast: Secondary | ICD-10-CM | POA: Diagnosis not present

## 2019-04-05 DIAGNOSIS — M5412 Radiculopathy, cervical region: Secondary | ICD-10-CM | POA: Diagnosis not present

## 2019-04-13 ENCOUNTER — Other Ambulatory Visit: Payer: Self-pay

## 2019-04-14 ENCOUNTER — Ambulatory Visit: Payer: BC Managed Care – PPO | Admitting: Gastroenterology

## 2019-04-14 ENCOUNTER — Encounter: Payer: Self-pay | Admitting: Gastroenterology

## 2019-04-14 ENCOUNTER — Other Ambulatory Visit: Payer: Self-pay

## 2019-04-14 VITALS — BP 152/79 | HR 79 | Temp 98.7°F

## 2019-04-14 DIAGNOSIS — K219 Gastro-esophageal reflux disease without esophagitis: Secondary | ICD-10-CM

## 2019-04-14 DIAGNOSIS — R195 Other fecal abnormalities: Secondary | ICD-10-CM | POA: Diagnosis not present

## 2019-04-14 DIAGNOSIS — I10 Essential (primary) hypertension: Secondary | ICD-10-CM | POA: Diagnosis not present

## 2019-04-14 DIAGNOSIS — K7689 Other specified diseases of liver: Secondary | ICD-10-CM | POA: Diagnosis not present

## 2019-04-14 DIAGNOSIS — E782 Mixed hyperlipidemia: Secondary | ICD-10-CM | POA: Diagnosis not present

## 2019-04-14 NOTE — Patient Instructions (Signed)
Metamucil daily as directed

## 2019-04-15 NOTE — Progress Notes (Signed)
Jane Antigua, MD [address]  [facility]  [place], [place] [address]  Main: [phone]  Fax: [phone]   Primary Care Physician: Dion Body, MD   Chief Complaint  Patient presents with  . Follow-up GERD    HPI: Jane [place] is a 68 y.o. female here for follow-up of GERD.  Patient states she is back on Zegerid as Pepcid was not helping her.  In the past she had complained of fecal smearing and was sent for pelvic floor physical therapy which patient states has helped.  Her fecal smearing has stopped.  This is a reason why we had stopped her PPI as well, as PPIs can cause diarrhea and could have been contributing to her fecal smearing.  However, since Pepcid did not control her symptoms she went back on the Zegerid and her symptoms are now well controlled.  However, now is reporting multiple loose bowel movements a day, but no incontinence.  No blood in stool.  No weight loss.  No abdominal pain.  No nausea or vomiting.  No dysphagia.  EGD June 2019 Impression: - Esophageal mucosal changes suggestive of eosinophilic  esophagitis. Biopsied. - Z-line regular. - Normal stomach. - Normal examined duodenum.  DIAGNOSIS:  A. ESOPHAGUS, DISTAL; COLD BIOPSY:  - STRATIFIED SQUAMOUS EPITHELIUM WITH INTRAEPITHELIAL EOSINOPHILS,  MAXIMUM NUMBER 15 PER HIGH-POWER FIELD.  - NEGATIVE FOR INTRAEPITHELIAL NEUTROPHILS.  - NEGATIVE FOR DYSPLASIA AND MALIGNANCY.   B. ESOPHAGUS, PROXIMAL; COLD BIOPSY:  - STRATIFIED SQUAMOUS EPITHELIUM WITHOUT EOSINOPHILS, NEUTROPHILS, OR  REACTIVE CHANGES.  - NEGATIVE FOR DYSPLASIA AND MALIGNANCY.   Comment:  The low number of distal intraepithelial eosinophils and absence of  proximal eosinophils is more suggestive of reflux esophagitis, but  clinical correlation is recommended.   Colonoscopy up-to-date, by Dr. Knute Neu done in  September 2018, Noted to be tortuous and difficult and proximal sigmoid/descending colon. Small diverticuli noted in the sigmoid colon. Internal hemorrhoid noted. It also reports cecum reached with certainty, but picture not done. Repeat colonoscopy recommended in 5 years.   Current Outpatient Medications  Medication Sig Dispense Refill  . Biotin 5000 MCG TABS Take 5,000 mcg by mouth daily.     . calcium carbonate (OSCAL) 1500 (600 Ca) MG TABS tablet Take 600 mg of elemental calcium by mouth 2 (two) times daily with a meal.    . calcium-vitamin D 250-100 MG-UNIT tablet Take 1 tablet by mouth 2 (two) times daily.    . carboxymethylcellulose (REFRESH TEARS) 0.5 % SOLN Apply to eye.    . Cholecalciferol (VITAMIN D3) 1000 units CAPS Take 1,000 Units by mouth daily.     . citalopram (CELEXA) 20 MG tablet Take 20 mg by mouth daily.    . Coenzyme Q10 (COQ10) 200 MG CAPS Take 200 mg by mouth daily.     . EUCRISA 2 % OINT     . levocetirizine (XYZAL) 5 MG tablet Take 1 tablet (5 mg total) by mouth every evening. 30 tablet 0  . lisinopril-hydrochlorothiazide (PRINZIDE,ZESTORETIC) 10-12.5 MG tablet Take 1 tablet by mouth daily.    Marland Kitchen lovastatin (MEVACOR) 20 MG tablet Take 20 mg by mouth at bedtime.    Marland Kitchen Lysine 1000 MG TABS Take 1,000 mg by mouth daily as needed (fever blisters).     . Magnesium (CVS TRIPLE MAGNESIUM COMPLEX) 400 MG CAPS Take by mouth.    . Multiple Vitamin (MULTIVITAMIN) tablet Take 1 tablet by mouth daily.    . Omega-3 Fatty Acids (KP FISH OIL)  1200 MG CAPS Take 1,200 mg by mouth daily.     Marland Kitchen omeprazole-sodium bicarbonate (ZEGERID) 40-1100 MG capsule Take 1 capsule by mouth daily before breakfast.    . raloxifene (EVISTA) 60 MG tablet Take 60 mg by mouth daily.    . Red Yeast Rice 600 MG TABS Take 1,200 mg by mouth daily.    . vitamin E 400 UNIT capsule Take 400 Units by mouth daily.    . Ascorbic Acid (VITAMIN C) 1000 MG tablet Take 1,000 mg by mouth daily.    . diclofenac  (CATAFLAM) 50 MG tablet Take 50 mg by mouth 3 (three) times daily.    . diphenhydrAMINE (BENADRYL) 25 MG tablet Take 25 mg by mouth at bedtime as needed for allergies or sleep.    . famotidine (PEPCID) 20 MG tablet TAKE 1 TABLET BY MOUTH TWICE A DAY (Patient not taking: Reported on 04/14/2019) 180 tablet 1  . Fluticasone Furoate (ARNUITY ELLIPTA) 200 MCG/ACT AEPB Inhale into the lungs daily.    . Magnesium Oxide 500 MG TABS Take 500 mg by mouth daily with breakfast.     . pantoprazole (PROTONIX) 20 MG tablet TAKE 1 TABLET BY MOUTH TWICE A DAY (Patient not taking: Reported on 04/14/2019) 180 tablet 1   No current facility-administered medications for this visit.     Allergies as of 04/14/2019 - Review Complete 04/14/2019  Allergen Reaction Noted  . Codeine Nausea And Vomiting and Nausea Only 02/20/2014  . Doxycycline  03/10/2018  . Adhesive [tape] Other (See Comments) 01/05/2017  . Amlodipine Swelling 05/21/2018  . Amoxicillin Rash 11/29/2014  . Nsaids Other (See Comments) 02/20/2014  . Oxycodone Itching 05/21/2018  . Pravastatin Other (See Comments) 02/20/2014    ROS:  General: Negative for anorexia, weight loss, fever, chills, fatigue, weakness. ENT: Negative for hoarseness, difficulty swallowing , nasal congestion. CV: Negative for chest pain, angina, palpitations, dyspnea on exertion, peripheral edema.  Respiratory: Negative for dyspnea at rest, dyspnea on exertion, cough, sputum, wheezing.  GI: See history of present illness. GU:  Negative for dysuria, hematuria, urinary incontinence, urinary frequency, nocturnal urination.  Endo: Negative for unusual weight change.    Physical Examination:   BP (!) 152/79   Pulse 79   Temp 98.7 F (37.1 C) (Oral)   General: Well-nourished, well-developed in no acute distress.  Eyes: No icterus. Conjunctivae pink. Mouth: Oropharyngeal mucosa moist and pink , no lesions erythema or exudate. Neck: Supple, Trachea midline Abdomen: Bowel sounds  are normal, nontender, nondistended, no hepatosplenomegaly or masses, no abdominal bruits or hernia , no rebound or guarding.   Extremities: No lower extremity edema. No clubbing or deformities. Neuro: Alert and oriented x 3.  Grossly intact. Skin: Warm and dry, no jaundice.   Psych: Alert and cooperative, normal mood and affect.   Labs: CMP     Component Value Date/Time   NA 142 05/28/2017 1120   K 3.7 05/28/2017 1120   CL 100 05/28/2017 1120   CO2 23 05/28/2017 1120   GLUCOSE 108 (H) 05/28/2017 1120   BUN 13 05/28/2017 1120   CREATININE 0.76 05/28/2017 1120   CALCIUM 9.5 05/28/2017 1120   PROT 6.8 05/28/2017 1120   ALBUMIN 4.7 05/28/2017 1120   AST 28 05/28/2017 1120   ALT 19 05/28/2017 1120   ALKPHOS 53 05/28/2017 1120   BILITOT 0.3 05/28/2017 1120   GFRNONAA 83 05/28/2017 1120   GFRAA 96 05/28/2017 1120   Lab Results  Component Value Date   WBC 11.6 (H)  07/03/2017   HGB 12.9 07/03/2017   HCT 38.1 07/03/2017   MCV 91 07/03/2017   PLT 167 07/03/2017    Imaging Studies: Mm 3d Screen Breast Bilateral  Result Date: 03/23/2019 CLINICAL DATA:  Screening. EXAM: DIGITAL SCREENING BILATERAL MAMMOGRAM WITH TOMO AND CAD COMPARISON:  Previous exam(s). ACR Breast Density Category c: The breast tissue is heterogeneously dense, which may obscure small masses. FINDINGS: There are no findings suspicious for malignancy. Images were processed with CAD. IMPRESSION: No mammographic evidence of malignancy. A result letter of this screening mammogram will be mailed directly to the patient. RECOMMENDATION: Screening mammogram in one year. (Code:SM-B-01Y) BI-RADS CATEGORY  1: Negative. Electronically Signed   By: Fidela Salisbury M.D.   On: 03/23/2019 14:23    Assessment and Plan:   JAELLA WEINERT is a 68 y.o. y/o female with GERD here for follow-up, and also reporting loose stools intermittently  Reflux symptoms well controlled with Zegerid Patient does not want to go back on Pepcid as it  did not help and would like to stay on Zegerid as that is the only thing that controls her symptoms Her symptoms include heartburn, and abdominal discomfort which have both resolved with Zegerid (Risks of PPI use were discussed with patient including bone loss, C. Diff diarrhea, pneumonia, infections, CKD, electrolyte abnormalities. Pt. Verbalizes understanding and chooses to continue the medication.) I have also asked her to try to start taking her Zegerid every other day instead of every day.  If this controls her symptoms, lowering the dose would help with lowering the risk of adverse effects and possibly help her loose stools as well  I have asked her to start using Metamucil for loose stools to help bulk stools.  If this does not help I have asked her to contact us.  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

## 2019-04-21 DIAGNOSIS — I10 Essential (primary) hypertension: Secondary | ICD-10-CM | POA: Diagnosis not present

## 2019-04-21 DIAGNOSIS — E782 Mixed hyperlipidemia: Secondary | ICD-10-CM | POA: Diagnosis not present

## 2019-04-21 DIAGNOSIS — F419 Anxiety disorder, unspecified: Secondary | ICD-10-CM | POA: Diagnosis not present

## 2019-05-03 DIAGNOSIS — M961 Postlaminectomy syndrome, not elsewhere classified: Secondary | ICD-10-CM | POA: Diagnosis not present

## 2019-05-12 DIAGNOSIS — M961 Postlaminectomy syndrome, not elsewhere classified: Secondary | ICD-10-CM | POA: Diagnosis not present

## 2019-05-12 DIAGNOSIS — M5021 Other cervical disc displacement,  high cervical region: Secondary | ICD-10-CM | POA: Diagnosis not present

## 2019-05-12 DIAGNOSIS — M50223 Other cervical disc displacement at C6-C7 level: Secondary | ICD-10-CM | POA: Diagnosis not present

## 2019-05-12 DIAGNOSIS — M47812 Spondylosis without myelopathy or radiculopathy, cervical region: Secondary | ICD-10-CM | POA: Diagnosis not present

## 2019-06-01 DIAGNOSIS — Z961 Presence of intraocular lens: Secondary | ICD-10-CM | POA: Diagnosis not present

## 2019-06-06 DIAGNOSIS — D2239 Melanocytic nevi of other parts of face: Secondary | ICD-10-CM | POA: Diagnosis not present

## 2019-06-06 DIAGNOSIS — L299 Pruritus, unspecified: Secondary | ICD-10-CM | POA: Diagnosis not present

## 2019-06-06 DIAGNOSIS — L738 Other specified follicular disorders: Secondary | ICD-10-CM | POA: Diagnosis not present

## 2019-07-04 DIAGNOSIS — M5412 Radiculopathy, cervical region: Secondary | ICD-10-CM | POA: Diagnosis not present

## 2019-08-04 DIAGNOSIS — Z6826 Body mass index (BMI) 26.0-26.9, adult: Secondary | ICD-10-CM | POA: Diagnosis not present

## 2019-08-04 DIAGNOSIS — M5030 Other cervical disc degeneration, unspecified cervical region: Secondary | ICD-10-CM | POA: Insufficient documentation

## 2019-08-04 DIAGNOSIS — M5412 Radiculopathy, cervical region: Secondary | ICD-10-CM | POA: Diagnosis not present

## 2019-08-04 DIAGNOSIS — I10 Essential (primary) hypertension: Secondary | ICD-10-CM | POA: Diagnosis not present

## 2019-09-02 ENCOUNTER — Other Ambulatory Visit: Payer: Self-pay

## 2019-09-02 DIAGNOSIS — R918 Other nonspecific abnormal finding of lung field: Secondary | ICD-10-CM

## 2019-09-15 ENCOUNTER — Other Ambulatory Visit: Payer: Self-pay

## 2019-09-15 ENCOUNTER — Ambulatory Visit
Admission: RE | Admit: 2019-09-15 | Discharge: 2019-09-15 | Disposition: A | Discharge status: Home / Self Care | Payer: BC Managed Care – PPO | Source: Ambulatory Visit | Attending: Internal Medicine | Admitting: Internal Medicine

## 2019-09-15 DIAGNOSIS — R918 Other nonspecific abnormal finding of lung field: Secondary | ICD-10-CM

## 2019-10-03 DIAGNOSIS — M5412 Radiculopathy, cervical region: Secondary | ICD-10-CM | POA: Diagnosis not present

## 2019-10-24 DIAGNOSIS — Q761 Klippel-Feil syndrome: Secondary | ICD-10-CM | POA: Insufficient documentation

## 2019-10-25 ENCOUNTER — Other Ambulatory Visit: Payer: Self-pay

## 2019-10-25 ENCOUNTER — Encounter: Payer: Self-pay | Admitting: Internal Medicine

## 2019-10-25 ENCOUNTER — Ambulatory Visit: Payer: BC Managed Care – PPO | Admitting: Internal Medicine

## 2019-10-25 VITALS — BP 144/80 | HR 82 | Temp 97.2°F | Resp 97 | Ht 64.0 in | Wt 186.6 lb

## 2019-10-25 DIAGNOSIS — J309 Allergic rhinitis, unspecified: Secondary | ICD-10-CM | POA: Diagnosis not present

## 2019-10-25 DIAGNOSIS — J411 Mucopurulent chronic bronchitis: Secondary | ICD-10-CM | POA: Diagnosis not present

## 2019-10-25 MED ORDER — FLUTICASONE PROPIONATE HFA 220 MCG/ACT IN AERO: 2.0000 | INHALATION_SPRAY | Freq: Two times a day (BID) | RESPIRATORY_TRACT | 2 refills | Status: DC

## 2019-10-25 NOTE — Progress Notes (Signed)
Jane [place]     Date: 10/25/2019  MRN# [id number] Jane [place] [birth year]    Jane [place] is a 69 y.o. old female seen in [place] for chief complaint of: COUGH AND SOB      SYNOPSIS The patient is a 69 year old female with a cough which started in the first week of June, but has been present on and off for about 3 years. At last visit was taken off ace and switched to Norvasc but stopped due to leg swelling. She was started on Arnuity sample which did not help. She has been restarted on lisinopril after a trial off of it which made no difference.   She underwent bronchoscopy on 05/27/18, this showed changes of Tracheobronchopathia osteochondroplastica, chronic bronchitis. Cultures, AFB were negative.   Today she notes that she has been having shortness of breath, she feels that her breathing has been doing well. She feels that her throat is sore and tight. She continues to have an occasional cough. She remains on arnuity, flonase.    She has a dog, which sleeps in the bed.  She was tested for allergies, she was allergic to several things, but not the dog. She has completed allergy shots.  She does not snore at night, has never been tested for OSA, she is not sleepy during the day.     **CT chest 09/07/2018>> images personally reviewed, in comparison with previous on 03/19/2018 there remain tiny bilateral nodules, largest of which is about 6 mm, not significantly changed from previous. **CT chest 03/19/2018>> images personally reviewed, there are tiny bilateral basal nodules, largest of which is 7 mm. **Absolute eosinophil count 05/28/2017>> 100  **PFT 11/23/2016>> tracings personally reviewed, consistent with normal pulmonary functions. 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 and discharge m She feels like she is definitely nasally congested at this time and is draining into her lungs  She has tried nasal sprays in the past and has not helped She is currently on Xyzal antihistamine for allergic rhinitis She is currently on antireflux medicine for her GERD   She does have seasonal allergies  She does have a dog at home  She denies having allergies to dogs or foods  She is sensitive to bleaches and some perfumes  I have explained to patient that patient had significant work-up for her chronic cough which include CT of her chest as well as bronchoscopy which did not show any type of significant finding except for maybe somemild inflammatory process in the airways I reassured the patient that bilateral pulmonary nodules subcentimeter in size does not cause chronic bronchitis   Medication:    Current Outpatient Medications:  .  Ascorbic Acid (VITAMIN C) 1000 MG tablet, Take 1,000 mg by mouth daily., Disp: , Rfl:  .  Biotin 5000 MCG TABS, Take 5,000 mcg by mouth daily. , Disp: , Rfl:  .  calcium carbonate (OSCAL) 1500 (600 Ca) MG TABS tablet, Take 600 mg of elemental calcium by mouth 2 (two) times daily with a meal., Disp: , Rfl:  .  calcium-vitamin D 250-100 MG-UNIT tablet, Take 1 tablet by mouth 2 (two) times daily., Disp: , Rfl:  .  carboxymethylcellulose (REFRESH TEARS) 0.5 % SOLN, Apply to eye., Disp: , Rfl:  .  Cholecalciferol (VITAMIN D3) 1000 units CAPS, Take 1,000 Units by mouth daily. , Disp: , Rfl:  .  citalopram (CELEXA) 20 MG  tablet, Take 20 mg by mouth daily., Disp: , Rfl:  .  Coenzyme Q10 (COQ10) 200 MG CAPS, Take 200 mg by mouth daily. , Disp: , Rfl:  .  diclofenac (CATAFLAM) 50 MG tablet, Take 50 mg by mouth 3 (three) times daily., Disp: , Rfl:  .  EUCRISA 2 % OINT, , Disp: , Rfl:  .  levocetirizine (XYZAL) 5 MG tablet, Take 1 tablet (5 mg total) by mouth every evening., Disp: 30 tablet, Rfl: 0 .  lisinopril-hydrochlorothiazide (PRINZIDE,ZESTORETIC) 10-12.5 MG tablet, Take 1 tablet by mouth daily., Disp: , Rfl:  .  lovastatin (MEVACOR) 20 MG tablet, Take 20 mg by mouth at bedtime., Disp: , Rfl:  .  Lysine 1000 MG TABS, Take 1,000 mg by mouth daily as needed (fever blisters). , Disp: , Rfl:  .  Magnesium (CVS TRIPLE MAGNESIUM COMPLEX) 400 MG CAPS, Take by mouth., Disp: , Rfl:  .  Magnesium Oxide 500 MG TABS, Take 500 mg by mouth daily with breakfast. , Disp: , Rfl:  .  Multiple Vitamin (MULTIVITAMIN) tablet, Take 1 tablet by mouth daily., Disp: , Rfl:  .  Omega-3 Fatty Acids (KP FISH OIL) 1200 MG CAPS, Take 1,200 mg by mouth daily. , Disp: , Rfl:  .  omeprazole-sodium bicarbonate (ZEGERID) 40-1100 MG capsule, Take 1 capsule by mouth daily before breakfast., Disp: , Rfl:  .  raloxifene (EVISTA) 60 MG tablet, Take 60 mg by mouth daily., Disp: , Rfl:  .  Red Yeast Rice 600 MG TABS, Take 1,200 mg by mouth daily., Disp: , Rfl:  .  vitamin E 400 UNIT capsule, Take 400 Units by mouth daily., Disp: , Rfl:    Allergies:  Codeine, Doxycycline, Adhesive [tape], Amlodipine, Amoxicillin, Nsaids, Oxycodone, and Pravastatin   Review of Systems:  Gen:  Denies  fever, sweats, chills weight loss  HEENT: Denies blurred vision, double vision, ear pain, eye pain, hearing loss, nose bleeds, sore throat +nasal congestion Cardiac:  No dizziness, chest pain or heaviness, chest tightness,edema, No JVD Resp:   + cough, +sputum production, -shortness of breath,-wheezing, -hemoptysis,  Gi: Denies swallowing difficulty, stomach pain,  nausea or vomiting, diarrhea, constipation, bowel incontinence Gu:  Denies bladder incontinence, burning urine Ext:   Denies Joint pain, stiffness or swelling Skin: Denies  skin rash, easy bruising or bleeding or hives Endoc:  Denies polyuria, polydipsia , polyphagia or weight change Psych:   Denies depression, insomnia or hallucinations  Other:  All other systems negative   BP (!) 144/80 (BP Location: Right Arm, Cuff Size: Large)   Pulse 82   Temp (!) 97.2 F (36.2 C) (Temporal)   Resp (!) 97 Comment: on ra  Ht 5\' 4"  (1.626 m)   Wt 186 lb 9.6 oz (84.6 kg)   SpO2 97%   BMI 32.03 kg/m       Physical Examination:   GENERAL:NAD, no fevers, chills, no weakness no fatigue HEAD: Normocephalic, atraumatic.  EYES: PERLA, EOMI No scleral icterus.  MOUTH:  Moist mucosal membrane.  EAR, NOSE, THROAT: Clear without exudates. No external lesions.  NECK: Supple. No thyromegaly.  No JVD.  PULMONARY: CTA B/L no wheezing, rhonchi, crackles CARDIOVASCULAR: S1 and S2. Regular rate and rhythm. No murmurs GASTROINTESTINAL: Soft, nontender, nondistended. Positive bowel sounds.  MUSCULOSKELETAL: No swelling, clubbing, or edema.  NEUROLOGIC: No gross focal neurological deficits. 5/5 strength all extremities SKIN: No ulceration, lesions, rashes, or cyanosis.  PSYCHIATRIC: Insight, judgment intact. -depression -anxiety ALL OTHER ROS ARE NEGATIVE       Assessment and Plan:  Chronic cough with chronic bronchitis.  Tracheophathia osteochondroplastica (Mild).  Symptoms most likely related to chronic bronchitis related to multiple allergies with upper airway cough syndrome with history of reflux History of negative bronchoscopy for MAI I would recommend starting steroid inhaler with Flovent  Continue antihistamine for allergic rhinitis Avoid allergens  Chronic allergic rhinitis, she of hoarseness of voice Patient will need ENT referral   Tiny lung nodules largest measuring 6 mm this is  very low risk lung cancer unchanged over the last several years Repeat CT chest as needed    COVID-19 EDUCATION: The signs and symptoms of COVID-19 were discussed with the patient and how to seek care for testing.  The importance of social distancing was discussed today. Hand Washing Techniques and avoid touching face was advised.     MEDICATION ADJUSTMENTS/LABS AND TESTS ORDERED: Start Flovent 24 ENT referral for chronic allergic rhinitis   CURRENT MEDICATIONS REVIEWED AT LENGTH WITH PATIENT TODAY   Patient satisfied with Plan of action and management. All questions answered  Follow up in 6 months   Irish Piech Patricia Pesa, M.D.  [facility]  Medical Director [place] Director [facility]

## 2019-10-25 NOTE — Patient Instructions (Signed)
START FLOVENT 220 PLEASE RINSE MOUTH AFTER EVERY USE  ENT REFERRAL FOR CHRONIC ALLERGIC RHONITIS

## 2019-10-28 ENCOUNTER — Telehealth: Payer: Self-pay | Admitting: Internal Medicine

## 2019-10-28 NOTE — Telephone Encounter (Signed)
Referral made to [facility]. [facility] contacted patient to schedule appointment and patient declined to schedule.    Just FYI for physician. Rhonda J Cobb

## 2020-01-03 ENCOUNTER — Ambulatory Visit: Payer: BC Managed Care – PPO | Admitting: Dermatology

## 2020-01-26 ENCOUNTER — Other Ambulatory Visit: Payer: Self-pay | Admitting: Internal Medicine

## 2020-01-26 DIAGNOSIS — J411 Mucopurulent chronic bronchitis: Secondary | ICD-10-CM

## 2020-02-06 ENCOUNTER — Ambulatory Visit: Payer: BC Managed Care – PPO | Admitting: Dermatology

## 2020-03-20 ENCOUNTER — Other Ambulatory Visit: Payer: Self-pay | Admitting: Family Medicine

## 2020-03-20 DIAGNOSIS — Z1231 Encounter for screening mammogram for malignant neoplasm of breast: Secondary | ICD-10-CM

## 2020-03-30 ENCOUNTER — Ambulatory Visit
Admission: RE | Admit: 2020-03-30 | Discharge: 2020-03-30 | Disposition: A | Discharge status: Home / Self Care | Payer: BC Managed Care – PPO | Source: Ambulatory Visit | Attending: Family Medicine | Admitting: Family Medicine

## 2020-03-30 DIAGNOSIS — Z1231 Encounter for screening mammogram for malignant neoplasm of breast: Secondary | ICD-10-CM | POA: Insufficient documentation

## 2020-04-23 ENCOUNTER — Other Ambulatory Visit: Payer: Self-pay

## 2020-04-24 ENCOUNTER — Encounter: Payer: Self-pay | Admitting: Gastroenterology

## 2020-04-24 ENCOUNTER — Other Ambulatory Visit: Payer: Self-pay

## 2020-04-24 ENCOUNTER — Ambulatory Visit: Payer: BC Managed Care – PPO | Admitting: Gastroenterology

## 2020-04-24 VITALS — BP 152/79 | HR 77 | Temp 98.4°F | Ht 64.0 in | Wt 150.2 lb

## 2020-04-24 DIAGNOSIS — Z1211 Encounter for screening for malignant neoplasm of colon: Secondary | ICD-10-CM

## 2020-04-24 DIAGNOSIS — K219 Gastro-esophageal reflux disease without esophagitis: Secondary | ICD-10-CM

## 2020-04-24 MED ORDER — OMEPRAZOLE-SODIUM BICARBONATE 40-1100 MG PO CAPS: 1.0000 | ORAL_CAPSULE | Freq: Every day | ORAL | 1 refills | Status: DC

## 2020-04-24 MED ORDER — NA SULFATE-K SULFATE-MG SULF 17.5-3.13-1.6 GM/177ML PO SOLN: ORAL | 0 refills | Status: DC

## 2020-04-25 ENCOUNTER — Ambulatory Visit: Payer: BC Managed Care – PPO | Admitting: Dermatology

## 2020-04-25 DIAGNOSIS — L821 Other seborrheic keratosis: Secondary | ICD-10-CM

## 2020-04-25 DIAGNOSIS — L299 Pruritus, unspecified: Secondary | ICD-10-CM

## 2020-04-25 DIAGNOSIS — L0100 Impetigo, unspecified: Secondary | ICD-10-CM | POA: Diagnosis not present

## 2020-04-25 DIAGNOSIS — Z86018 Personal history of other benign neoplasm: Secondary | ICD-10-CM

## 2020-04-25 DIAGNOSIS — L738 Other specified follicular disorders: Secondary | ICD-10-CM

## 2020-04-25 DIAGNOSIS — L820 Inflamed seborrheic keratosis: Secondary | ICD-10-CM | POA: Diagnosis not present

## 2020-04-25 DIAGNOSIS — Z1283 Encounter for screening for malignant neoplasm of skin: Secondary | ICD-10-CM

## 2020-04-25 DIAGNOSIS — D1800 Hemangioma unspecified site: Secondary | ICD-10-CM

## 2020-04-25 DIAGNOSIS — L814 Other melanin hyperpigmentation: Secondary | ICD-10-CM

## 2020-04-25 DIAGNOSIS — D229 Melanocytic nevi, unspecified: Secondary | ICD-10-CM

## 2020-04-25 DIAGNOSIS — L578 Other skin changes due to chronic exposure to nonionizing radiation: Secondary | ICD-10-CM

## 2020-04-25 MED ORDER — MUPIROCIN 2 % EX OINT: 1.0000 "application " | TOPICAL_OINTMENT | Freq: Two times a day (BID) | CUTANEOUS | 2 refills | Status: DC

## 2020-04-25 NOTE — Progress Notes (Signed)
Follow-Up Visit   Subjective  Jane [place] is a 69 y.o. female who presents for the following: Annual Exam.  Patient here today for TBSE. She is not aware of anything new or changing. Has a history of dysplastic nevi.  She has had some red spots come up on her legs, and on one her forehead that are irritated.  She uses Mupirocin ointment for sores in the nose as needed, which helps. Needs rf.  Her scalp itching has improved and she currently uses Free and Clear shampoo with zinc.  She had oil glands on face treated with ED in past with good result.  The following portions of the chart were reviewed this encounter and updated as appropriate:      Review of Systems:  No other skin or systemic complaints except as noted in HPI or Assessment and Plan.  Objective  Well appearing patient in no apparent distress; mood and affect are within normal limits.  A full examination was performed including scalp, head, eyes, ears, nose, lips, neck, chest, axillae, abdomen, back, buttocks, bilateral upper extremities, bilateral lower extremities, hands, feet, fingers, toes, fingernails, and toenails. All findings within normal limits unless otherwise noted below.  Objective  Left Hand - dorsum: 2.5 x 2.0 speckled brown patch  Objective  L medial ankle x 1, L lower pretibial x 1, R medial pretibial x 1, R upper forehead X 1 (4): Erythematous keratotic waxy stuck-on papule  Objective  Face: Yellow papules- decreased in number and size  Objective  Scalp: Clear today, itching has improved  Objective  nostrils: Clear today, mupirocin ointment helps, uses prn   Assessment & Plan  Lentigines Left Hand - dorsum  Biopsy proven Solar Lentigo Benign, observe.     Inflamed seborrheic keratosis (4) L medial ankle x 1, L lower pretibial x 1, R medial pretibial x 1, R upper forehead X 1  Recheck left medial ankle on follow up. Pt will RTC if doesn't clear.  Destruction of lesion - L medial  ankle x 1, L lower pretibial x 1, R medial pretibial x 1, R upper forehead X 1  Destruction method: cryotherapy   Informed consent: discussed and consent obtained   Lesion destroyed using liquid nitrogen: Yes   Region frozen until ice ball extended beyond lesion: Yes   Outcome: patient tolerated procedure well with no complications   Post-procedure details: wound care instructions given    Sebaceous hyperplasia Face  Benign, observe.  Good result with prior ED treatment  Pruritus Scalp  Scalp, controlled Continue Free & Clear shampoo with zinc  Impetigo nostrils  Continue mupirocin ointment to inside of nostrils twice daily as needed  mupirocin ointment (BACTROBAN) 2 % - nostrils   History of Dysplastic Nevi - No evidence of recurrence today - Recommend regular full body skin exams - Recommend daily broad spectrum sunscreen SPF 30+ to sun-exposed areas, reapply every 2 hours as needed.  - Call if any new or changing lesions are noted between office visits  Lentigines - Scattered tan macules back, chest - Discussed due to sun exposure - Benign, observe - Call for any changes  Seborrheic Keratoses - Stuck-on, waxy, tan-brown papules and plaques back, arms, left abdomen - Discussed benign etiology and prognosis. - Observe - Call for any changes  Melanocytic Nevi - Tan-brown and/or pink-flesh-colored symmetric macules and papules back, abdomen - Benign appearing on exam today - Observation - Call clinic for new or changing moles - Recommend daily use of  broad spectrum spf 30+ sunscreen to sun-exposed areas.   Hemangiomas - Red papules back, chest - Discussed benign nature - Observe - Call for any changes  Actinic Damage - diffuse scaly erythematous macules with underlying dyspigmentation chest - Recommend daily broad spectrum sunscreen SPF 30+ to sun-exposed areas, reapply every 2 hours as needed.  - Call for new or changing lesions.  Skin cancer screening  performed today.  Return in about 1 year (around 04/25/2021) for recheck ISK's , TBSE.  Graciella Belton, RMA, am acting as scribe for Brendolyn Patty, MD . Documentation: I have reviewed the above documentation for accuracy and completeness, and I agree with the above.  Brendolyn Patty MD

## 2020-04-25 NOTE — Patient Instructions (Addendum)

## 2020-04-25 NOTE — Progress Notes (Signed)
Vonda Antigua, MD [address]  [place]  [place], [place] [address]  Main: [phone]  Fax: [phone]   Primary Care Physician: Dion Body, MD   Chief Complaint  Patient presents with  . Gastroesophageal Reflux    HPI: Jane Riley is a 69 y.o. female here for follow-up of reflux.  Continues to take Zegerid.  Previously Zegerid was discontinued and Pepcid was used.  However, patient had severe symptoms with this and went back on Zegerid.  Patient denies any dysphagia, weight loss, abdominal pain, nausea or vomiting.  In the past she had complained of fecal smearing and was sent for pelvic floor physical therapy which patient states has helped.  Her fecal smearing has stopped.  This is a reason why we had stopped her PPI as well, as PPIs can cause diarrhea and could have been contributing to her fecal smearing.  However, since Pepcid did not control her symptoms she went back on the Zegerid and her symptoms are now well controlled.  However, now is reporting multiple loose bowel movements a day, but no incontinence.  No blood in stool.  No weight loss.  No abdominal pain.  No nausea or vomiting.  No dysphagia.  EGD June 2019 Impression: - Esophageal mucosal changes suggestive of eosinophilic  esophagitis. Biopsied. - Z-line regular. - Normal stomach. - Normal examined duodenum.  DIAGNOSIS:  A. ESOPHAGUS, DISTAL; COLD BIOPSY:  - STRATIFIED SQUAMOUS EPITHELIUM WITH INTRAEPITHELIAL EOSINOPHILS,  MAXIMUM NUMBER 15 PER HIGH-POWER FIELD.  - NEGATIVE FOR INTRAEPITHELIAL NEUTROPHILS.  - NEGATIVE FOR DYSPLASIA AND MALIGNANCY.   B. ESOPHAGUS, PROXIMAL; COLD BIOPSY:  - STRATIFIED SQUAMOUS EPITHELIUM WITHOUT EOSINOPHILS, NEUTROPHILS, OR  REACTIVE CHANGES.  - NEGATIVE FOR DYSPLASIA AND MALIGNANCY.   Comment:  The low number of distal intraepithelial eosinophils  and absence of  proximal eosinophils is more suggestive of reflux esophagitis, but  clinical correlation is recommended.   Colonoscopy up-to-date, by Dr. Knute Neu done in September 2018, Noted to be tortuous and difficult and proximal sigmoid/descending colon. Small diverticuli noted in the sigmoid colon. Internal hemorrhoid noted. It also reports cecum reached with certainty, but picture not done. Repeat colonoscopy recommended in 5 years.   Current Outpatient Medications  Medication Sig Dispense Refill  . Biotin 5000 MCG TABS Take 5,000 mcg by mouth daily.     . carboxymethylcellulose (REFRESH TEARS) 0.5 % SOLN Apply to eye.    . Cholecalciferol (VITAMIN D3) 1000 units CAPS Take 1,000 Units by mouth daily.     . citalopram (CELEXA) 20 MG tablet Take 20 mg by mouth daily.    . Coenzyme Q10 (COQ10) 200 MG CAPS Take 200 mg by mouth daily.     . diclofenac (CATAFLAM) 50 MG tablet Take 50 mg by mouth 3 (three) times daily.    . EUCRISA 2 % OINT     . FLOVENT HFA 220 MCG/ACT inhaler TAKE 2 PUFFS BY MOUTH TWICE A DAY 12 Inhaler 2  . levocetirizine (XYZAL) 5 MG tablet Take 1 tablet (5 mg total) by mouth every evening. 30 tablet 0  . lisinopril-hydrochlorothiazide (PRINZIDE,ZESTORETIC) 10-12.5 MG tablet Take 1 tablet by mouth daily.    Marland Kitchen lovastatin (ALTOPREV) 60 MG 24 hr tablet Take 60 mg by mouth at bedtime.    . Magnesium ([employer]S TRIPLE MAGNESIUM COMPLEX) 400 MG CAPS Take by mouth.    . Multiple Vitamin (MULTIVITAMIN) tablet Take 1 tablet by mouth daily.    . Omega-3 Fatty Acids (KP FISH OIL) 1200 MG  CAPS Take 1,200 mg by mouth daily.     Marland Kitchen omeprazole-sodium bicarbonate (ZEGERID) 40-1100 MG capsule Take 1 capsule by mouth daily before breakfast. 90 capsule 1  . raloxifene (EVISTA) 60 MG tablet Take 60 mg by mouth daily.    . vitamin E 400 UNIT capsule Take 400 Units by mouth daily.    Marland Kitchen Lysine 1000 MG TABS Take 1,000 mg by mouth daily as needed (fever blisters).  (Patient not taking:  Reported on 04/24/2020)    . Magnesium Oxide 500 MG TABS Take 500 mg by mouth daily with breakfast.  (Patient not taking: Reported on 04/24/2020)    . mupirocin ointment (BACTROBAN) 2 % Place 1 application into the nose 2 (two) times daily. To insides of nose as needed 22 g 2  . Na Sulfate-K Sulfate-Mg Sulf 17.5-3.13-1.6 GM/177ML SOLN At 5 PM the day before procedure take 1 bottle and 5 hours before procedure take 1 bottle. 354 mL 0  . Red Yeast Rice 600 MG TABS Take 1,200 mg by mouth daily. (Patient not taking: Reported on 04/24/2020)     No current facility-administered medications for this visit.    Allergies as of 04/24/2020 - Review Complete 04/24/2020  Allergen Reaction Noted  . Codeine Nausea And Vomiting and Nausea Only 02/20/2014  . Doxycycline  03/10/2018  . Adhesive [tape] Other (See Comments) 01/05/2017  . Amlodipine Swelling 05/21/2018  . Amoxicillin Rash 11/29/2014  . Nsaids Other (See Comments) 02/20/2014  . Other Itching 02/20/2014  . Oxycodone Itching 05/21/2018  . Pravastatin Other (See Comments) 02/20/2014    ROS:  General: Negative for anorexia, weight loss, fever, chills, fatigue, weakness. ENT: Negative for hoarseness, difficulty swallowing , nasal congestion. CV: Negative for chest pain, angina, palpitations, dyspnea on exertion, peripheral edema.  Respiratory: Negative for dyspnea at rest, dyspnea on exertion, cough, sputum, wheezing.  GI: See history of present illness. GU:  Negative for dysuria, hematuria, urinary incontinence, urinary frequency, nocturnal urination.  Endo: Negative for unusual weight change.    Physical Examination:   BP (!) 152/79   Pulse 77   Temp 98.4 F (36.9 C) (Oral)   Ht 5\' 4"  (1.626 m)   Wt 150 lb 3.2 oz (68.1 kg)   BMI 25.78 kg/m   General: Well-nourished, well-developed in no acute distress.  Eyes: No icterus. Conjunctivae pink. Mouth: Oropharyngeal mucosa moist and pink , no lesions erythema or exudate. Neck: Supple,  Trachea midline Abdomen: Bowel sounds are normal, nontender, nondistended, no hepatosplenomegaly or masses, no abdominal bruits or hernia , no rebound or guarding.   Extremities: No lower extremity edema. No clubbing or deformities. Neuro: Alert and oriented x 3.  Grossly intact. Skin: Warm and dry, no jaundice.   Psych: Alert and cooperative, normal mood and affect.   Labs: CMP     Component Value Date/Time   NA 142 05/28/2017 1120   K 3.7 05/28/2017 1120   CL 100 05/28/2017 1120   CO2 23 05/28/2017 1120   GLUCOSE 108 (H) 05/28/2017 1120   BUN 13 05/28/2017 1120   CREATININE 0.76 05/28/2017 1120   CALCIUM 9.5 05/28/2017 1120   PROT 6.8 05/28/2017 1120   ALBUMIN 4.7 05/28/2017 1120   AST 28 05/28/2017 1120   ALT 19 05/28/2017 1120   ALKPHOS 53 05/28/2017 1120   BILITOT 0.3 05/28/2017 1120   GFRNONAA 83 05/28/2017 1120   GFRAA 96 05/28/2017 1120   Lab Results  Component Value Date   WBC 11.6 (H) 07/03/2017   HGB  12.9 07/03/2017   HCT 38.1 07/03/2017   MCV 91 07/03/2017   PLT 167 07/03/2017    Imaging Studies: MM 3D SCREEN BREAST BILATERAL  Result Date: 04/02/2020 CLINICAL DATA:  Screening. EXAM: DIGITAL SCREENING BILATERAL MAMMOGRAM WITH TOMO AND CAD COMPARISON:  Previous exam(s). ACR Breast Density Category b: There are scattered areas of fibroglandular density. FINDINGS: There are no findings suspicious for malignancy. Images were processed with CAD. IMPRESSION: No mammographic evidence of malignancy. A result letter of this screening mammogram will be mailed directly to the patient. RECOMMENDATION: Screening mammogram in one year. (Code:SM-B-01Y) BI-RADS CATEGORY  1: Negative. Electronically Signed   By: Marin Olp M.D.   On: 04/02/2020 16:27    Assessment and Plan:   Jane Riley is a 69 y.o. y/o female here for follow-up of reflux  Patient unable to come off PPI due to severe symptoms without the medication Symptoms well controlled on once daily PPI  Patient  educated extensively on acid reflux lifestyle modification, including buying a bed wedge, not eating 3 hrs before bedtime, diet modifications, and handout given for the same.   Previous colonoscopy reported tortuous colon Patient continues to report fecal smearing, despite pelvic floor physical therapy  Patient interested in repeat colonoscopy at this time  Proceed with colonoscopy for screening EGD for Barrett's screening due to chronic reflux  (Risks of PPI use were discussed with patient including bone loss, C. Diff diarrhea, pneumonia, infections, CKD, electrolyte abnormalities.  Pt. Verbalizes understanding and chooses to continue the medication.)  Patient not interested in discontinuing Zegerid after discussing risks of PPI as above due to return of symptoms without PPI despite lifestyle changes  Dr Vonda Antigua

## 2020-05-08 ENCOUNTER — Other Ambulatory Visit
Admission: RE | Admit: 2020-05-08 | Discharge: 2020-05-08 | Disposition: A | Discharge status: Home / Self Care | Payer: BC Managed Care – PPO | Source: Ambulatory Visit | Attending: Gastroenterology | Admitting: Gastroenterology

## 2020-05-08 ENCOUNTER — Other Ambulatory Visit: Payer: Self-pay

## 2020-05-08 DIAGNOSIS — Z01812 Encounter for preprocedural laboratory examination: Secondary | ICD-10-CM | POA: Insufficient documentation

## 2020-05-08 DIAGNOSIS — Z20822 Contact with and (suspected) exposure to covid-19: Secondary | ICD-10-CM | POA: Insufficient documentation

## 2020-05-08 LAB — SARS CORONAVIRUS 2 (TAT 6-24 HRS): SARS Coronavirus 2: NEGATIVE

## 2020-05-09 ENCOUNTER — Encounter: Payer: Self-pay | Admitting: Gastroenterology

## 2020-05-10 ENCOUNTER — Encounter
Admission: RE | Disposition: A | Discharge status: Home / Self Care | Payer: Self-pay | Source: Home / Self Care | Attending: Gastroenterology

## 2020-05-10 ENCOUNTER — Ambulatory Visit
Admission: RE | Admit: 2020-05-10 | Discharge: 2020-05-10 | Disposition: A | Discharge status: Home / Self Care | Payer: BC Managed Care – PPO | Attending: Gastroenterology | Admitting: Gastroenterology

## 2020-05-10 ENCOUNTER — Encounter: Payer: Self-pay | Admitting: Gastroenterology

## 2020-05-10 ENCOUNTER — Ambulatory Visit: Payer: BC Managed Care – PPO | Admitting: Anesthesiology

## 2020-05-10 DIAGNOSIS — K219 Gastro-esophageal reflux disease without esophagitis: Secondary | ICD-10-CM

## 2020-05-10 DIAGNOSIS — K228 Other specified diseases of esophagus: Secondary | ICD-10-CM | POA: Diagnosis not present

## 2020-05-10 DIAGNOSIS — Q438 Other specified congenital malformations of intestine: Secondary | ICD-10-CM | POA: Insufficient documentation

## 2020-05-10 DIAGNOSIS — D175 Benign lipomatous neoplasm of intra-abdominal organs: Secondary | ICD-10-CM | POA: Diagnosis not present

## 2020-05-10 DIAGNOSIS — K317 Polyp of stomach and duodenum: Secondary | ICD-10-CM | POA: Diagnosis not present

## 2020-05-10 DIAGNOSIS — Z1211 Encounter for screening for malignant neoplasm of colon: Secondary | ICD-10-CM | POA: Insufficient documentation

## 2020-05-10 DIAGNOSIS — D1779 Benign lipomatous neoplasm of other sites: Secondary | ICD-10-CM | POA: Insufficient documentation

## 2020-05-10 DIAGNOSIS — K648 Other hemorrhoids: Secondary | ICD-10-CM | POA: Diagnosis not present

## 2020-05-10 DIAGNOSIS — K5730 Diverticulosis of large intestine without perforation or abscess without bleeding: Secondary | ICD-10-CM | POA: Diagnosis not present

## 2020-05-10 HISTORY — PX: COLONOSCOPY WITH PROPOFOL: SHX5780

## 2020-05-10 HISTORY — PX: ESOPHAGOGASTRODUODENOSCOPY (EGD) WITH PROPOFOL: SHX5813

## 2020-05-10 LAB — KOH PREP

## 2020-05-10 SURGERY — COLONOSCOPY WITH PROPOFOL
Anesthesia: General

## 2020-05-10 MED ORDER — GLYCOPYRROLATE 0.2 MG/ML IJ SOLN
INTRAMUSCULAR | Status: DC | PRN
  Administered 2020-05-10: .2 mg via INTRAVENOUS

## 2020-05-10 MED ORDER — PROPOFOL 500 MG/50ML IV EMUL
INTRAVENOUS | Status: DC | PRN
  Administered 2020-05-10: 150 ug/kg/min via INTRAVENOUS

## 2020-05-10 MED ORDER — PROPOFOL 10 MG/ML IV BOLUS
INTRAVENOUS | Status: DC | PRN
  Administered 2020-05-10: 40 mg via INTRAVENOUS
  Administered 2020-05-10: 60 mg via INTRAVENOUS

## 2020-05-10 MED ORDER — SODIUM CHLORIDE 0.9 % IV SOLN: INTRAVENOUS | Status: DC

## 2020-05-10 MED ORDER — LIDOCAINE 2% (20 MG/ML) 5 ML SYRINGE
INTRAMUSCULAR | Status: DC | PRN
  Administered 2020-05-10: 100 mg via INTRAVENOUS

## 2020-05-10 MED ORDER — PROPOFOL 10 MG/ML IV BOLUS
INTRAVENOUS | Status: AC
  Filled 2020-05-10: qty 20

## 2020-05-10 MED ORDER — GLYCOPYRROLATE 0.2 MG/ML IJ SOLN
INTRAMUSCULAR | Status: AC
  Filled 2020-05-10: qty 1

## 2020-05-10 MED ORDER — PROPOFOL 500 MG/50ML IV EMUL
INTRAVENOUS | Status: AC
  Filled 2020-05-10: qty 50

## 2020-05-10 MED ORDER — LIDOCAINE HCL (PF) 2 % IJ SOLN
INTRAMUSCULAR | Status: AC
  Filled 2020-05-10: qty 5

## 2020-05-10 NOTE — Anesthesia Postprocedure Evaluation (Signed)
Anesthesia Post Note  Patient: Jane Riley  Procedure(s) Performed: COLONOSCOPY WITH PROPOFOL (N/A ) ESOPHAGOGASTRODUODENOSCOPY (EGD) WITH PROPOFOL (N/A )  Patient location during evaluation: Endoscopy Anesthesia Type: General Level of consciousness: awake and alert Pain management: pain level controlled Vital Signs Assessment: post-procedure vital signs reviewed and stable Respiratory status: spontaneous breathing, nonlabored ventilation, respiratory function stable and patient connected to nasal cannula oxygen Cardiovascular status: blood pressure returned to baseline and stable Postop Assessment: no apparent nausea or vomiting Anesthetic complications: no   No complications documented.   Last Vitals:  Vitals:   05/10/20 1050 05/10/20 1110  BP: 134/60 (!) 150/74  Pulse: 71 67  Resp: 14 18  Temp:    SpO2: 98% 97%    Last Pain:  Vitals:   05/10/20 0856  TempSrc: Temporal  PainSc: 0-No pain                 Martha Clan

## 2020-05-10 NOTE — Op Note (Signed)
[facility]astroenter[facility] Patient Name: Jane Riley Procedure Date: 05/10/2020 9:49 AM MRN: [id number] Account #: [id number] Date of Birth: Jun 17, 1951 Admit Type: Outpatient Age: 69 Room: [facility] ENDO ROOM 1 Gender: Female Note Status: Finalized Procedure:             Colonoscopy Indications:           Screening for colorectal malignant neoplasm Providers:             Rejeana Fadness B. Bonna Gains MD, MD Referring MD:          Dion Body (Referring MD) Medicines:             Monitored Anesthesia Care Complications:         No immediate complications. Procedure:             Pre-Anesthesia Assessment:                        - Prior to the procedure, a History and Physical was                         performed, and patient medications, allergies and                         sensitivities were reviewed. The patient's tolerance                         of previous anesthesia was reviewed.                        - The risks and benefits of the procedure and the                         sedation options and risks were discussed with the                         patient. All questions were answered and informed                         consent was obtained.                        - Patient identification and proposed procedure were                         verified prior to the procedure by the physician, the                         nurse, the anesthetist and the technician. The                         procedure was verified in the pre-procedure area in                         the procedure room in the endoscopy suite.                        - ASA Grade Assessment: II - A patient with mild  systemic disease.                        - After reviewing the risks and benefits, the patient                         was deemed in satisfactory condition to undergo the                         procedure.                        After obtaining informed consent, the  colonoscope was                         passed under direct vision. Throughout the procedure,                         the patient's blood pressure, pulse, and oxygen                         saturations were monitored continuously. The                         Colonoscope was introduced through the anus and                         advanced to the the cecum, identified by appendiceal                         orifice and ileocecal valve. The colonoscopy was                         performed with ease. The patient tolerated the                         procedure well. The quality of the bowel preparation                         was good. Findings:      The perianal and digital rectal examinations were normal.      Multiple diverticula were found in the sigmoid colon.      There was a medium-sized lipoma, 10 mm in diameter, in the cecum.       Biopsies were taken with a cold forceps for histology.      The sigmoid colon was moderately tortuous.      The exam was otherwise without abnormality.      The rectum, sigmoid colon, descending colon, transverse colon, ascending       colon and cecum appeared normal. Biopsies for histology were taken with       a cold forceps from the entire colon for evaluation of microscopic       colitis.      Non-bleeding internal hemorrhoids were found during retroflexion. The       hemorrhoids were small.      No additional abnormalities were found on retroflexion. Impression:            - Diverticulosis in the sigmoid colon.                        -  Medium-sized lipoma in the cecum. Biopsied.                        - Tortuous colon.                        - The examination was otherwise normal.                        - The rectum, sigmoid colon, descending colon,                         transverse colon, ascending colon and cecum are                         normal. Biopsied.                        - Non-bleeding internal hemorrhoids. Recommendation:        -  Discharge patient to home.                        - Resume previous diet.                        - Continue present medications.                        - Repeat colonoscopy in 5 years for screening purposes.                        - Return to primary care physician as previously                         scheduled.                        - The findings and recommendations were discussed with                         the patient.                        - The findings and recommendations were discussed with                         the patient's family.                        - High fiber diet.                        - Await pathology results. Procedure Code(s):     --- Professional ---                        [phone], Colonoscopy, flexible; with biopsy, single or                         multiple Diagnosis Code(s):     --- Professional ---                        Z12.11, Encounter for screening  for malignant neoplasm                         of colon CPT copyright 2019 American Medical Association. All rights reserved. The codes documented in this report are preliminary and upon coder review may  be revised to meet current compliance requirements.  Vonda Antigua, MD Margretta Sidle B. Bonna Gains MD, MD 05/10/2020 10:49:07 AM This report has been signed electronically. Number of Addenda: 0 Note Initiated On: 05/10/2020 9:49 AM Scope Withdrawal Time: 0 hours 13 minutes 38 seconds  Total Procedure Duration: 0 hours 28 minutes 11 seconds  Estimated Blood Loss:  Estimated blood loss: none.      [facility]

## 2020-05-10 NOTE — Anesthesia Preprocedure Evaluation (Signed)
Anesthesia Evaluation  Patient identified by MRN, date of birth, ID band Patient awake    Reviewed: Allergy & Precautions, H&P , NPO status , Patient's Chart, lab work & pertinent test results, reviewed documented beta blocker date and time   History of Anesthesia Complications (+) PONV, Family history of anesthesia reaction and history of anesthetic complications  Airway Mallampati: I  TM Distance: >3 FB Neck ROM: full    Dental  (+) Caps, Dental Advidsory Given, Chipped, Teeth Intact   Pulmonary neg shortness of breath, neg COPD, neg recent URI, former smoker,           Cardiovascular Exercise Tolerance: Good hypertension, (-) angina(-) CAD, (-) Past MI, (-) Cardiac Stents and (-) CABG (-) dysrhythmias (-) Valvular Problems/Murmurs     Neuro/Psych PSYCHIATRIC DISORDERS Anxiety Depression negative neurological ROS     GI/Hepatic Neg liver ROS, GERD  ,  Endo/Other  negative endocrine ROS  Renal/GU negative Renal ROS  negative genitourinary   Musculoskeletal   Abdominal   Peds  Hematology negative hematology ROS (+)   Anesthesia Other Findings Past Medical History: No date: Arthritis     Comment:  neck, hands No date: CRPS (complex regional pain syndrome) No date: Depression No date: Family history of adverse reaction to anesthesia     Comment:  mother nausea and vomiting No date: GERD (gastroesophageal reflux disease) No date: Hypercholesteremia No date: Hypertension No date: Osteoporosis No date: PONV (postoperative nausea and vomiting)   Reproductive/Obstetrics negative OB ROS                             Anesthesia Physical  Anesthesia Plan  ASA: II  Anesthesia Plan: General   Post-op Pain Management:    Induction: Intravenous  PONV Risk Score and Plan: 4 or greater and Propofol infusion and TIVA  Airway Management Planned: Nasal Cannula  Additional Equipment:    Intra-op Plan:   Post-operative Plan:   Informed Consent: I have reviewed the patients History and Physical, chart, labs and discussed the procedure including the risks, benefits and alternatives for the proposed anesthesia with the patient or authorized representative who has indicated his/her understanding and acceptance.     Dental Advisory Given  Plan Discussed with: Anesthesiologist, CRNA and Surgeon  Anesthesia Plan Comments:         Anesthesia Quick Evaluation

## 2020-05-10 NOTE — Op Note (Signed)
[facility] Gastroenterology Patient Name: Jane Riley Procedure Date: 05/10/2020 9:50 AM MRN: [id number] Account #: [id number] Date of Birth: 1950/11/30 Admit Type: Outpatient Age: 69 Room: [facility] ENDO ROOM 1 Gender: Female Note Status: Finalized Procedure:             Upper GI endoscopy Indications:           Heartburn Providers:             Domingos Riggi B. Bonna Gains MD, MD Referring MD:          Dion Body (Referring MD) Medicines:             Monitored Anesthesia Care Complications:         No immediate complications. Procedure:             Pre-Anesthesia Assessment:                        - Prior to the procedure, a History and Physical was                         performed, and patient medications, allergies and                         sensitivities were reviewed. The patient's tolerance                         of previous anesthesia was reviewed.                        - The risks and benefits of the procedure and the                         sedation options and risks were discussed with the                         patient. All questions were answered and informed                         consent was obtained.                        - Patient identification and proposed procedure were                         verified prior to the procedure by the physician, the                         nurse, the anesthesiologist, the anesthetist and the                         technician. The procedure was verified in the                         procedure room.                        - ASA Grade Assessment: II - A patient with mild                         systemic disease.  After obtaining informed consent, the endoscope was                         passed under direct vision. Throughout the procedure,                         the patient's blood pressure, pulse, and oxygen                         saturations were monitored continuously. The Endoscope                          was introduced through the mouth, and advanced to the                         second part of duodenum. The upper GI endoscopy was                         accomplished with ease. The patient tolerated the                         procedure well. Findings:      White nummular lesions were noted in the entire esophagus. Brushings for       KOH prep were obtained.      The exam of the esophagus was otherwise normal.      Patchy mildly erythematous mucosa without bleeding was found in the       gastric antrum. Biopsies were taken with a cold forceps for histology.       Biopsies were obtained in the gastric body, at the incisura and in the       gastric antrum with cold forceps for histology.      A single 4 mm sessile polyp with no bleeding and no stigmata of recent       bleeding was found in the gastric fundus. Biopsies were taken with a       cold forceps for histology.      The exam of the stomach was otherwise normal.      The duodenal bulb, second portion of the duodenum and examined duodenum       were normal. Impression:            - White nummular lesions in esophageal mucosa.                         Brushings performed.                        - Erythematous mucosa in the antrum. Biopsied.                        - Normal duodenal bulb, second portion of the duodenum                         and examined duodenum.                        - Biopsies were obtained in the gastric body, at the  incisura and in the gastric antrum. Recommendation:        - Await pathology results.                        - Discharge patient to home (with escort).                        - Advance diet as tolerated.                        - Continue present medications.                        - Patient has a contact number available for                         emergencies. The signs and symptoms of potential                         delayed complications were discussed  with the patient.                         Return to normal activities tomorrow. Written                         discharge instructions were provided to the patient.                        - Discharge patient to home (with escort).                        - The findings and recommendations were discussed with                         the patient.                        - The findings and recommendations were discussed with                         the patient's family. Procedure Code(s):     --- Professional ---                        [phone], Esophagogastroduodenoscopy, flexible,                         transoral; with biopsy, single or multiple Diagnosis Code(s):     --- Professional ---                        K22.8, Other specified diseases of esophagus                        K31.89, Other diseases of stomach and duodenum                        R12, Heartburn CPT copyright 2019 American Medical Association. All rights reserved. The codes documented in this report are preliminary and upon coder review may  be revised to meet current compliance requirements.  Vonda Antigua, MD Margretta Sidle B. Bonna Gains MD, MD  05/10/2020 10:07:12 AM This report has been signed electronically. Number of Addenda: 0 Note Initiated On: 05/10/2020 9:50 AM Estimated Blood Loss:  Estimated blood loss: none.      [facility]

## 2020-05-10 NOTE — H&P (Signed)
Jane Antigua, MD [address] [place], [place], [place], [address] [address], [place], [place], [place], [address] Phone: [phone]  Fax: [phone]  Primary Care Physician:  Dion Body, MD   Pre-Procedure History & Physical: HPI:  Jane Riley is a 69 y.o. female is here for a colonoscopy and EGD.   Past Medical History:  Diagnosis Date  . Arthritis    neck, hands  . CRPS (complex regional pain syndrome)   . Depression   . Dysplastic nevus 12/07/2007   left post shoulder. Severe atypia. Excised: 01/17/2008, no residual neoplasm  . Dysplastic nevus 12/22/2017   left buttock. Moderate atypia, limited margins free  . Family history of adverse reaction to anesthesia    mother nausea and vomiting  . GERD (gastroesophageal reflux disease)   . Hypercholesteremia   . Hypertension   . Osteoporosis   . PONV (postoperative nausea and vomiting)     Past Surgical History:  Procedure Laterality Date  . ABDOMINAL HYSTERECTOMY    . CATARACT EXTRACTION W/PHACO Right 01/12/2017   Procedure: CATARACT EXTRACTION PHACO AND INTRAOCULAR LENS PLACEMENT (IOC) Right Restor Lens;  Surgeon: Ronnell Freshwater, MD;  Location: [facility];  Service: Ophthalmology;  Laterality: Right;  Restor Lens  . CESAREAN SECTION     x2  . CHOLECYSTECTOMY    . ESOPHAGOGASTRODUODENOSCOPY (EGD) WITH PROPOFOL N/A 03/24/2018   Procedure: ESOPHAGOGASTRODUODENOSCOPY (EGD) WITH PROPOFOL;  Surgeon: Virgel Manifold, MD;  Location: [facility];  Service: Endoscopy;  Laterality: N/A;  . FLEXIBLE BRONCHOSCOPY N/A 05/27/2018   Procedure: FLEXIBLE BRONCHOSCOPY;  Surgeon: [name], MD;  Location: [facility];  Service: Pulmonary;  Laterality: N/A;  . NECK SURGERY  2008   4 level fusion.  plates and screws  . TONSILLECTOMY    . WRIST SURGERY Left 2014   implant but unsure of the type    Prior to Admission medications   Medication Sig Start Date End Date Taking?  Authorizing Provider  carboxymethylcellulose (REFRESH TEARS) 0.5 % SOLN Apply to eye.   Yes [provider]  diclofenac (CATAFLAM) 50 MG tablet Take 50 mg by mouth 3 (three) times daily.   Yes [provider]  EUCRISA 2 % OINT  12/27/18  Yes [provider]  FLOVENT HFA 220 MCG/ACT inhaler TAKE 2 PUFFS BY MOUTH TWICE A DAY 01/26/20  Yes Kasa, Maretta Bees, MD  levocetirizine (XYZAL) 5 MG tablet Take 1 tablet (5 mg total) by mouth every evening. 10/07/17  Yes Ratcliffe, Heather R, PA-C  lisinopril-hydrochlorothiazide (PRINZIDE,ZESTORETIC) 10-12.5 MG tablet Take 1 tablet by mouth daily.   Yes [provider]  lovastatin (ALTOPREV) 60 MG 24 hr tablet Take 60 mg by mouth at bedtime.   Yes [provider]  omeprazole-sodium bicarbonate (ZEGERID) 40-1100 MG capsule Take 1 capsule by mouth daily before breakfast. 04/24/20  Yes Jane Riley B, MD  raloxifene (EVISTA) 60 MG tablet Take 60 mg by mouth daily.   Yes [provider]  Biotin 5000 MCG TABS Take 5,000 mcg by mouth daily.     [provider]  Cholecalciferol (VITAMIN D3) 1000 units CAPS Take 1,000 Units by mouth daily.     [provider]  citalopram (CELEXA) 20 MG tablet Take 20 mg by mouth daily. Patient not taking: Reported on 05/10/2020    [provider]  Coenzyme Q10 (COQ10) 200 MG CAPS Take 200 mg by mouth daily.     [provider]  Lysine 1000 MG TABS Take 1,000 mg  by mouth daily as needed (fever blisters).  Patient not taking: Reported on 04/24/2020    [provider]  Magnesium (CVS TRIPLE MAGNESIUM COMPLEX) 400 MG CAPS Take by mouth.    [provider]  Magnesium Oxide 500 MG TABS Take 500 mg by mouth daily with breakfast.  Patient not taking: Reported on 04/24/2020    [provider]  Multiple Vitamin (MULTIVITAMIN) tablet Take 1 tablet by mouth daily.    [provider]  mupirocin ointment (BACTROBAN) 2 % Place 1  application into the nose 2 (two) times daily. To insides of nose as needed Patient not taking: Reported on 05/10/2020 04/25/20   Brendolyn Patty, MD  Na Sulfate-K Sulfate-Mg Sulf 17.5-3.13-1.6 GM/177ML SOLN At 5 PM the day before procedure take 1 bottle and 5 hours before procedure take 1 bottle. 04/24/20   Virgel Manifold, MD  Omega-3 Fatty Acids (KP FISH OIL) 1200 MG CAPS Take 1,200 mg by mouth daily.     [provider]  Red Yeast Rice 600 MG TABS Take 1,200 mg by mouth daily. Patient not taking: Reported on 04/24/2020    [provider]  vitamin E 400 UNIT capsule Take 400 Units by mouth daily.    [provider]    Allergies as of 04/25/2020 - Review Complete 04/25/2020  Allergen Reaction Noted  . Codeine Nausea And Vomiting and Nausea Only 02/20/2014  . Doxycycline  03/10/2018  . Adhesive [tape] Other (See Comments) 01/05/2017  . Amlodipine Swelling 05/21/2018  . Amoxicillin Rash 11/29/2014  . Nsaids Other (See Comments) 02/20/2014  . Other Itching 02/20/2014  . Oxycodone Itching 05/21/2018  . Pravastatin Other (See Comments) 02/20/2014    Family History  Problem Relation Age of Onset  . Breast cancer Cousin   . Breast cancer Cousin     Social History   Socioeconomic History  . Marital status: Married    Spouse name: Not on file  . Number of children: Not on file  . Years of education: Not on file  . Highest education level: Not on file  Occupational History  . Occupation: Librarian, academic  Tobacco Use  . Smoking status: Former Smoker    Packs/day: 0.25    Types: Cigarettes    Quit date: 10/06/2006    Years since quitting: 13.6  . Smokeless tobacco: Never Used  Vaping Use  . Vaping Use: Never used  Substance and Sexual Activity  . Alcohol use: Yes    Alcohol/week: 7.0 standard drinks    Types: 7 Glasses of wine per week  . Drug use: Never  . Sexual activity: Not on file  Other Topics Concern  . Not on file  Social History  Narrative  . Not on file   Social Determinants of Health   Financial Resource Strain:   . Difficulty of Paying Living Expenses:   Food Insecurity:   . Worried About Charity fundraiser in the Last Year:   . Arboriculturist in the Last Year:   Transportation Needs:   . Film/video editor (Medical):   Marland Kitchen Lack of Transportation (Non-Medical):   Physical Activity:   . Days of Exercise per Week:   . Minutes of Exercise per Session:   Stress:   . Feeling of Stress :   Social Connections:   . Frequency of Communication with Friends and Family:   . Frequency of Social Gatherings with Friends and Family:   . Attends Religious Services:   . Active  Member of Clubs or Organizations:   . Attends Archivist Meetings:   Marland Kitchen Marital Status:   Intimate Partner Violence:   . Fear of Current or Ex-Partner:   . Emotionally Abused:   Marland Kitchen Physically Abused:   . Sexually Abused:     Review of Systems: See HPI, otherwise negative ROS  Physical Exam: BP (!) 158/82   Pulse 70   Temp (!) 96.6 F (35.9 C) (Temporal)   Resp 18   Ht _0  (1.626 m)   Wt 69.9 kg   SpO2 100%   BMI 26.43 kg/m  General:   Alert,  pleasant and cooperative in NAD Head:  Normocephalic and atraumatic. Neck:  Supple; no masses or thyromegaly. Lungs:  Clear throughout to auscultation, normal respiratory effort.    Heart:  +S1, +S2, Regular rate and rhythm, No edema. Abdomen:  Soft, nontender and nondistended. Normal bowel sounds, without guarding, and without rebound.   Neurologic:  Alert and  oriented x4;  grossly normal neurologically.  Impression/Plan: Jane Riley is here for a colonoscopy to be performed for average risk screening and EGD for Acid Reflux.  Risks, benefits, limitations, and alternatives regarding the procedures have been reviewed with the patient.  Questions have been answered.  All parties agreeable.   Virgel Manifold, MD  05/10/2020, 9:44 AM

## 2020-05-10 NOTE — Transfer of Care (Signed)
Immediate Anesthesia Transfer of Care Note  Patient: Jane Riley  Procedure(s) Performed: COLONOSCOPY WITH PROPOFOL (N/A ) ESOPHAGOGASTRODUODENOSCOPY (EGD) WITH PROPOFOL (N/A )  Patient Location: Endoscopy Unit  Anesthesia Type:General  Level of Consciousness: sedated  Airway & Oxygen Therapy: Patient connected to nasal cannula oxygen  Post-op Assessment: Post -op Vital signs reviewed and stable  Post vital signs: stable  Last Vitals:  Vitals Value Taken Time  BP 149/75 05/10/20 1041  Temp    Pulse 70 05/10/20 1042  Resp 19 05/10/20 1042  SpO2 99 % 05/10/20 1042  Vitals shown include unvalidated device data.  Last Pain:  Vitals:   05/10/20 0856  TempSrc: Temporal  PainSc: 0-No pain         Complications: No complications documented.

## 2020-05-11 ENCOUNTER — Encounter: Payer: Self-pay | Admitting: Gastroenterology

## 2020-05-11 LAB — SURGICAL PATHOLOGY

## 2020-05-22 ENCOUNTER — Other Ambulatory Visit: Payer: Self-pay | Admitting: Gastroenterology

## 2020-05-22 MED ORDER — FLUCONAZOLE 200 MG PO TABS: ORAL_TABLET | ORAL | 0 refills | Status: AC

## 2020-05-24 DIAGNOSIS — M5412 Radiculopathy, cervical region: Secondary | ICD-10-CM | POA: Insufficient documentation

## 2020-05-25 ENCOUNTER — Telehealth: Payer: Self-pay

## 2020-05-25 NOTE — Telephone Encounter (Signed)
-----   Message from Virgel Manifold, MD sent at 05/22/2020 11:44 AM EDT ----- Herb Grays please let the patient know, the brushings from her esophagus showed yeast.  We have sent an antifungal to her pharmacy.  While she is taking this medication, she should not take her lovastatin, her cholesterol medication for the 15 days that she is taking this fluconazole.  After she finishes the fluconazole, she can resume her lovastatin the next day.  Please document that you discussed this with her.

## 2020-05-25 NOTE — Telephone Encounter (Signed)
Called patient to let her know what Dr. Bonna Gains wanted me to tell her about her results. Patient understood and had no further questions. Patient stated that she would start taking her antibiotic and stop her Lovastatin and restart it when she was done with her antibiotic. I told patient to give [place] a call in case she had questions or concerns.

## 2020-06-29 ENCOUNTER — Telehealth: Payer: Self-pay | Admitting: Nurse Practitioner

## 2020-06-29 ENCOUNTER — Other Ambulatory Visit: Payer: Self-pay

## 2020-06-29 ENCOUNTER — Ambulatory Visit: Payer: Self-pay

## 2020-06-29 VITALS — BP 170/79 | HR 72 | Temp 98.4°F | Resp 16 | Ht 64.0 in | Wt 148.0 lb

## 2020-06-29 DIAGNOSIS — J029 Acute pharyngitis, unspecified: Secondary | ICD-10-CM

## 2020-06-29 DIAGNOSIS — Z20822 Contact with and (suspected) exposure to covid-19: Secondary | ICD-10-CM

## 2020-06-29 DIAGNOSIS — R519 Headache, unspecified: Secondary | ICD-10-CM

## 2020-06-29 LAB — POC COVID19 BINAXNOW: SARS Coronavirus 2 Ag: NEGATIVE

## 2020-06-29 NOTE — Progress Notes (Signed)
  Subjective:     Patient ID: Jane Riley, female   DOB: 1951/07/28, 69 y.o.   MRN: [id number]  HPI  Severe HA with a stiff neck cough & sore throat. Was feeling ill this morning has not taken any of her medications yet today including blood pressure medications. Has not taken anything for pain.   Son was home over the weekend and then started to experience a headache with swollen glands went to [facility] had an inconclusive COVID test, went back for repeat testing today.  Son works in Architect. No other sick contacts   Patient has had an anterior cervical fusion and does have pain and limited ROM after surgery.  Denies chronic back pain, denies urinary difficulty or pain.   Review of Systems  HENT: Positive for congestion and sore throat.   Respiratory: Positive for cough.   Musculoskeletal: Positive for back pain, myalgias and neck pain.    Today's Vitals   06/29/20 1119  BP: (!) 170/79  Pulse: 72  Resp: 16  Temp: 98.4 F (36.9 C)  TempSrc: Oral  SpO2: 96%  Weight: 148 lb (67.1 kg)  Height: 5\' 4"  (1.626 m)   Body mass index is 25.4 kg/m.    Repeat BP manual 160/84   Spo2 98%  Objective:   Physical Exam Constitutional:      Appearance: She is well-developed.  HENT:     Right Ear: Tympanic membrane normal.     Left Ear: Tympanic membrane normal.     Nose: Rhinorrhea present.     Mouth/Throat:     Pharynx: No oropharyngeal exudate or posterior oropharyngeal erythema.  Eyes:     Conjunctiva/sclera: Conjunctivae normal.     Pupils: Pupils are equal, round, and reactive to light.  Cardiovascular:     Rate and Rhythm: Normal rate and regular rhythm.  Pulmonary:     Effort: Pulmonary effort is normal.     Breath sounds: Normal breath sounds.  Musculoskeletal:     Cervical back: Neck supple.  Skin:    General: Skin is warm and dry.  Neurological:     Mental Status: She is alert.        Assessment:     Patient assessed in clinic and found to be  stable. Limited ROM to neck is stable with post surgical limitation, no new.   Pain in back is not CVA tenderness  Instructed to go home rest, take daily medications and recheck blood pressure. If levels remain elevated notify medical team, high BP in clinic is likely due to missing morning medication in combination with acute illness. At this time no need for acute intervention as she is otherwise stable.   Manage symptoms with OTCs as discussed. Will send PCR and follow up with results. Remain at home over weekend, seek medical attention for any new or worsening symptoms as discussed. May return to work if fever free and with negative PCR.     Plan:     Will follow up with PCR results, patient to follow up as needed if symptoms persist or with new concerns

## 2020-07-01 LAB — NOVEL CORONAVIRUS, NAA: SARS-CoV-2, NAA: NOT DETECTED

## 2020-07-01 LAB — SARS-COV-2, NAA 2 DAY TAT

## 2020-07-10 ENCOUNTER — Telehealth: Payer: Self-pay | Admitting: Internal Medicine

## 2020-07-10 DIAGNOSIS — J411 Mucopurulent chronic bronchitis: Secondary | ICD-10-CM

## 2020-07-10 MED ORDER — FLOVENT HFA 220 MCG/ACT IN AERO: INHALATION_SPRAY | RESPIRATORY_TRACT | 11 refills | Status: DC

## 2020-07-10 NOTE — Telephone Encounter (Signed)
Spoke to patient, who is requesting refill on Flovent 220. Rx has been sent to preferred pharmacy. Nothing further needed.

## 2020-09-05 ENCOUNTER — Other Ambulatory Visit: Payer: Self-pay

## 2020-09-05 ENCOUNTER — Encounter: Payer: Self-pay | Admitting: Internal Medicine

## 2020-09-05 ENCOUNTER — Ambulatory Visit (INDEPENDENT_AMBULATORY_CARE_PROVIDER_SITE_OTHER): Payer: Medicare Other | Admitting: Internal Medicine

## 2020-09-05 VITALS — BP 148/78 | HR 80 | Temp 97.3°F | Ht 64.0 in | Wt 152.8 lb

## 2020-09-05 DIAGNOSIS — J42 Unspecified chronic bronchitis: Secondary | ICD-10-CM

## 2020-09-05 NOTE — Patient Instructions (Addendum)
Continue antihistamines as prescribed Continue inhalers as prescribed Continue medications for reflux Incentive spirometry 10-15 times per day(breathing in exercise) Letter valve 10-15 times per day(breathing out exercise)

## 2020-09-05 NOTE — Progress Notes (Signed)
[facility] Consultation     Date: 09/05/2020  MRN# [id number] Jane Riley November 09, 1950    Jane Riley is a 69 y.o. old female seen in consultation for chief complaint of: COUGH AND SOB      SYNOPSIS The patient is a 69 year old female with a cough which started in the first week of June, but has been present on and off for about 3 years. At last visit was taken off ace and switched to Norvasc but stopped due to leg swelling. She was started on Arnuity sample which did not help. She has been restarted on lisinopril after a trial off of it which made no difference.   She underwent bronchoscopy on 05/27/18, this showed changes of Tracheobronchopathia osteochondroplastica, chronic bronchitis. Cultures, AFB were negative.   Today she notes that she has been having shortness of breath, she feels that her breathing has been doing well. She feels that her throat is sore and tight. She continues to have an occasional cough. She remains on arnuity, flonase.    She has a dog, which sleeps in the bed.  She was tested for allergies, she was allergic to several things, but not the dog. She has completed allergy shots.  She does not snore at night, has never been tested for OSA, she is not sleepy during the day.     **CT chest 09/07/2018>> images personally reviewed, in comparison with previous on 03/19/2018 there remain tiny bilateral nodules, largest of which is about 6 mm, not significantly changed from previous. **CT chest 03/19/2018>> images personally reviewed, there are tiny bilateral basal nodules, largest of which is 7 mm. **Absolute eosinophil count 05/28/2017>> 100  **PFT 11/23/2016>> tracings personally reviewed, consistent with normal pulmonary functions. SPIROMETRY: FVC was 2.26 liters, 80% of predicted FEV1 was 1.81, 79% of predicted FEV1 ratio was 80 FEF 25-75% liters per second was 81% of predicted  LUNG VOLUMES: TLC was 86% of predicted RV was 98% of  predicted  DIFFUSION CAPACITY: DLCO was 83% of predicted DLCO/VA was 153% of predicted  FLOW VOLUME LOOP: Normal   Impression Overall a normal study   CC  Follow-up shortness of breath   HPI Has been diagnosed with chronic bronchitis Chronic cough for many years Significant secondhand smoke exposure in the past Quit tobacco abuse 15 years ago Works at [employer] as a Librarian, academic She complains mostly of nasal drainage and discharge Nasal congestion which drains into her chest  She has tried multiple nasal sprays in the past not helped Currently on Xyzal antihistamine for allergic rhinitis Currently on antireflux medicine for GERD   Has a history of seasonal allergies She does have a dog at home she denies having allergies to dogs or foods She is sensitive to bleaches and some perfumes      I have explained to patient that patient had significant work-up for her chronic cough which include CT of her chest as well as bronchoscopy which did not show any type of significant finding except for maybe some mild inflammatory process in the airways I reassured the patient that bilateral pulmonary nodules subcentimeter in size does not cause chronic bronchitis   Medication:    Current Outpatient Medications:  .  Biotin 5000 MCG TABS, Take 5,000 mcg by mouth daily. , Disp: , Rfl:  .  carboxymethylcellulose (REFRESH TEARS) 0.5 % SOLN, Apply to eye., Disp: , Rfl:  .  Cholecalciferol (VITAMIN D3) 1000 units CAPS, Take 1,000 Units by mouth daily. , Disp: ,  Rfl:  .  citalopram (CELEXA) 20 MG tablet, Take 20 mg by mouth daily. (Patient not taking: Reported on 05/10/2020), Disp: , Rfl:  .  Coenzyme Q10 (COQ10) 200 MG CAPS, Take 200 mg by mouth daily. , Disp: , Rfl:  .  diclofenac (CATAFLAM) 50 MG tablet, Take 50 mg by mouth 3 (three) times daily., Disp: , Rfl:  .  EUCRISA 2 % OINT, , Disp: , Rfl:  .  fluticasone (FLOVENT HFA) 220 MCG/ACT inhaler, TAKE 2 PUFFS BY MOUTH TWICE A DAY,  Disp: 12 g, Rfl: 11 .  levocetirizine (XYZAL) 5 MG tablet, Take 1 tablet (5 mg total) by mouth every evening., Disp: 30 tablet, Rfl: 0 .  lisinopril-hydrochlorothiazide (PRINZIDE,ZESTORETIC) 10-12.5 MG tablet, Take 1 tablet by mouth daily., Disp: , Rfl:  .  lovastatin (ALTOPREV) 60 MG 24 hr tablet, Take 60 mg by mouth at bedtime., Disp: , Rfl:  .  Lysine 1000 MG TABS, Take 1,000 mg by mouth daily as needed (fever blisters).  (Patient not taking: Reported on 04/24/2020), Disp: , Rfl:  .  Magnesium (CVS TRIPLE MAGNESIUM COMPLEX) 400 MG CAPS, Take by mouth., Disp: , Rfl:  .  Magnesium Oxide 500 MG TABS, Take 500 mg by mouth daily with breakfast.  (Patient not taking: Reported on 04/24/2020), Disp: , Rfl:  .  Multiple Vitamin (MULTIVITAMIN) tablet, Take 1 tablet by mouth daily., Disp: , Rfl:  .  mupirocin ointment (BACTROBAN) 2 %, Place 1 application into the nose 2 (two) times daily. To insides of nose as needed (Patient not taking: Reported on 05/10/2020), Disp: 22 g, Rfl: 2 .  Na Sulfate-K Sulfate-Mg Sulf 17.5-3.13-1.6 GM/177ML SOLN, At 5 PM the day before procedure take 1 bottle and 5 hours before procedure take 1 bottle., Disp: 354 mL, Rfl: 0 .  Omega-3 Fatty Acids (KP FISH OIL) 1200 MG CAPS, Take 1,200 mg by mouth daily. , Disp: , Rfl:  .  omeprazole-sodium bicarbonate (ZEGERID) 40-1100 MG capsule, Take 1 capsule by mouth daily before breakfast., Disp: 90 capsule, Rfl: 1 .  raloxifene (EVISTA) 60 MG tablet, Take 60 mg by mouth daily., Disp: , Rfl:  .  Red Yeast Riley 600 MG TABS, Take 1,200 mg by mouth daily. (Patient not taking: Reported on 04/24/2020), Disp: , Rfl:  .  vitamin E 400 UNIT capsule, Take 400 Units by mouth daily., Disp: , Rfl:    Allergies:  Codeine, Doxycycline, Adhesive [tape], Amlodipine, Amoxicillin, Nsaids, Other, Oxycodone, and Pravastatin    Review of Systems:  Gen:  Denies  fever, sweats, chills weight loss  HEENT: Denies blurred vision, double vision, ear pain, eye pain,  hearing loss, nose bleeds, sore throat Cardiac:  No dizziness, chest pain or heaviness, chest tightness,edema, No JVD Resp:   + cough, +sputum production, +shortness of breath,-wheezing, -hemoptysis,  Gi: Denies swallowing difficulty, stomach pain, nausea or vomiting, diarrhea, constipation, bowel incontinence Gu:  Denies bladder incontinence, burning urine Ext:   Denies Joint pain, stiffness or swelling Skin: Denies  skin rash, easy bruising or bleeding or hives Endoc:  Denies polyuria, polydipsia , polyphagia or weight change Psych:   Denies depression, insomnia or hallucinations  Other:  All other systems negative  BP (!) 148/78 (BP Location: Left Arm, Cuff Size: Normal)   Pulse 80   Temp (!) 97.3 F (36.3 C) (Temporal)   Ht 5\' 4"  (1.626 m)   Wt 152 lb 12.8 oz (69.3 kg)   SpO2 97%   BMI 26.23 kg/m  Physical Examination:   General Appearance: No distress  Neuro:without focal findings,  speech normal,  HEENT: PERRLA, EOM intact.   Pulmonary: normal breath sounds, No wheezing.  CardiovascularNormal S1,S2.  No m/r/g.   Abdomen: Benign, Soft, non-tender. Renal:  No costovertebral tenderness  GU:  Not performed at this time. Endoc: No evident thyromegaly Skin:   warm, no rashes, no ecchymosis  Extremities: normal, no cyanosis, clubbing. PSYCHIATRIC: Mood, affect within normal limits.   ALL OTHER ROS ARE NEGATIVE      Assessment and Plan:  Chronic cough with chronic bronchitis  Tracheophathia osteochondroplastica (Mild).  Symptoms most likely related to chronic bronchitis related to multiple allergies with upper airway cough syndrome with history of reflux  Will obtain repeat pulmonary function testing and assess for lung function Her previous PFTs were in 2019 Incentive spirometry 10-15 times per day Flutter valve 10-15 times per day  History of negative bronchoscopy for MAI  Patient was started on inhaler Flovent   Continue antihistamine for chronic  allergic rhinitis Avoid allergens     Tiny lung nodules largest measuring 6 mm Very low risk for lung cancer Unchanged over the last several years Repeat CT chest as needed    COVID-19 EDUCATION: The signs and symptoms of COVID-19 were discussed with the patient and how to seek care for testing.  The importance of social distancing was discussed today. Hand Washing Techniques and avoid touching face was advised.     MEDICATION ADJUSTMENTS/LABS AND TESTS ORDERED: Continue antihistamines as prescribed Continue inhalers as prescribed Continue medications for reflux Incentive spirometry 10-15 times per day(breathing in exercise) Letter valve 10-15 times per day(breathing out exercise)   CURRENT MEDICATIONS REVIEWED AT LENGTH WITH PATIENT TODAY   Patient satisfied with Plan of action and management. All questions answered  Follow up in 1 year Total time spent 31 minutes  Corrin Parker, M.D.  [facility]  Medical Director [place] Director [facility] Cardio-Pulmonary Department

## 2020-09-21 ENCOUNTER — Telehealth: Payer: Self-pay | Admitting: Internal Medicine

## 2020-09-21 ENCOUNTER — Other Ambulatory Visit: Payer: Medicare Other

## 2020-09-21 NOTE — Telephone Encounter (Signed)
I have spoke with Mrs. Inoue and her Covid Test has been rescheduled for 10/03/20 and PFT has been rescheduled for 10/04/20 @ 4:00pm

## 2020-09-24 ENCOUNTER — Ambulatory Visit: Payer: Medicare Other

## 2020-10-03 ENCOUNTER — Other Ambulatory Visit: Payer: Self-pay

## 2020-10-03 ENCOUNTER — Other Ambulatory Visit
Admission: RE | Admit: 2020-10-03 | Discharge: 2020-10-03 | Disposition: A | Discharge status: Home / Self Care | Payer: Medicare Other | Source: Ambulatory Visit | Attending: Internal Medicine | Admitting: Internal Medicine

## 2020-10-03 DIAGNOSIS — Z20822 Contact with and (suspected) exposure to covid-19: Secondary | ICD-10-CM | POA: Insufficient documentation

## 2020-10-03 DIAGNOSIS — Z01812 Encounter for preprocedural laboratory examination: Secondary | ICD-10-CM | POA: Insufficient documentation

## 2020-10-03 LAB — SARS CORONAVIRUS 2 (TAT 6-24 HRS): SARS Coronavirus 2: NEGATIVE

## 2020-10-04 ENCOUNTER — Ambulatory Visit: Payer: Medicare Other | Attending: Internal Medicine

## 2020-10-04 DIAGNOSIS — J42 Unspecified chronic bronchitis: Secondary | ICD-10-CM | POA: Insufficient documentation

## 2020-10-04 DIAGNOSIS — J988 Other specified respiratory disorders: Secondary | ICD-10-CM | POA: Diagnosis not present

## 2020-10-04 MED ORDER — ALBUTEROL SULFATE (2.5 MG/3ML) 0.083% IN NEBU
2.5000 mg | INHALATION_SOLUTION | Freq: Once | RESPIRATORY_TRACT | Status: AC
  Administered 2020-10-04: 15:00:00 2.5 mg via RESPIRATORY_TRACT
  Filled 2020-10-04: qty 3

## 2020-10-09 ENCOUNTER — Other Ambulatory Visit: Payer: Self-pay

## 2020-10-09 ENCOUNTER — Ambulatory Visit: Payer: Medicare Other | Admitting: Dermatology

## 2020-10-09 DIAGNOSIS — L821 Other seborrheic keratosis: Secondary | ICD-10-CM

## 2020-10-09 DIAGNOSIS — L820 Inflamed seborrheic keratosis: Secondary | ICD-10-CM | POA: Diagnosis not present

## 2020-10-09 MED ORDER — MOMETASONE FUROATE 0.1 % EX CREA: TOPICAL_CREAM | CUTANEOUS | 0 refills | Status: DC

## 2020-10-09 NOTE — Patient Instructions (Addendum)
Start mometasone cream twice daily to spot at left hand for up to 2 weeks.   Topical steroids (such as triamcinolone, fluocinolone, fluocinonide, mometasone, clobetasol, halobetasol, betamethasone, hydrocortisone) can cause thinning and lightening of the skin if they are used for too long in the same area. Your physician has selected the right strength medicine for your problem and area affected on the body. Please use your medication only as directed by your physician to prevent side effects.   Cryotherapy Aftercare  . Wash gently with soap and water everyday.   Marland Kitchen Apply Vaseline and Band-Aid daily until healed.

## 2020-10-09 NOTE — Progress Notes (Signed)
   Follow-Up Visit   Subjective  Jane Riley is a 70 y.o. 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 About 6 weeks ago a spot developed at the biopsy site that looks like a burn, itches. She has been using topical antibiotic and mupirocin. ).  She also has a few rough itchy spots at back near braline.   The following portions of the chart were reviewed this encounter and updated as appropriate:       Review of Systems:  No other skin or systemic complaints except as noted in HPI or Assessment and Plan.  Objective  Well appearing patient in no apparent distress; mood and affect are within normal limits.  A focused examination was performed including left hand, back. Relevant physical exam findings are noted in the Assessment and Plan.  Objective  Left dorsal hand, spinal mid back at braline x 1: Erythematous keratotic or waxy stuck-on papule or patch   Images     Assessment & Plan  Inflamed seborrheic keratosis (2) Left dorsal hand; spinal mid back at braline x 1  Vs Dermatitis at R mid back  Start mometasone cream twice daily for up to 2 weeks to affected areas L hand dorsum and R mid back.  D/c topical antibiotic ointment to hand  Topical steroids (such as triamcinolone, fluocinolone, fluocinonide, mometasone, clobetasol, halobetasol, betamethasone, hydrocortisone) can cause thinning and lightening of the skin if they are used for too long in the same area. Your physician has selected the right strength medicine for your problem and area affected on the body. Please use your medication only as directed by your physician to prevent side effects.    Destruction of lesion - spinal mid back at braline x 1  Destruction method: cryotherapy   Informed consent: discussed and consent obtained   Lesion destroyed using liquid nitrogen: Yes   Region frozen until ice ball extended beyond lesion: Yes    Outcome: patient tolerated procedure well with no complications   Post-procedure details: wound care instructions given    mometasone (ELOCON) 0.1 % cream - Left dorsal hand  Seborrheic Keratoses - Stuck-on, waxy, tan-brown papules and plaques  - Discussed benign etiology and prognosis. - Observe - Call for any changes  Return for as scheduled for TBSE, 1 month follow up at hand.  Anise Salvo, RMA, am acting as scribe for Willeen Niece, MD . Documentation: I have reviewed the above documentation for accuracy and completeness, and I agree with the above.  Willeen Niece MD

## 2020-10-11 LAB — PULMONARY FUNCTION TEST ARMC ONLY
DL/VA % pred: 115 %
DL/VA: 4.78 ml/min/mmHg/L
DLCO unc % pred: 87 %
DLCO unc: 17.09 ml/min/mmHg
FEF 25-75 Post: 1.82 L/sec
FEF 25-75 Pre: 1.74 L/sec
FEF2575-%Change-Post: 4 %
FEF2575-%Pred-Post: 91 %
FEF2575-%Pred-Pre: 87 %
FEV1-%Change-Post: 1 %
FEV1-%Pred-Post: 68 %
FEV1-%Pred-Pre: 67 %
FEV1-Post: 1.58 L
FEV1-Pre: 1.56 L
FEV1FVC-%Change-Post: 1 %
FEV1FVC-%Pred-Pre: 107 %
FEV6-%Change-Post: 0 %
FEV6-%Pred-Post: 64 %
FEV6-%Pred-Pre: 64 %
FEV6-Post: 1.89 L
FEV6-Pre: 1.9 L
FEV6FVC-%Pred-Post: 104 %
FEV6FVC-%Pred-Pre: 104 %
FVC-%Change-Post: 0 %
FVC-%Pred-Post: 62 %
FVC-%Pred-Pre: 62 %
FVC-Post: 1.9 L
Post FEV1/FVC ratio: 83 %
Post FEV6/FVC ratio: 100 %
Pre FEV1/FVC ratio: 82 %
Pre FEV6/FVC Ratio: 100 %
RV % pred: 113 %
RV: 2.44 L
TLC % pred: 91 %
TLC: 4.61 L

## 2020-10-20 ENCOUNTER — Other Ambulatory Visit: Payer: Self-pay | Admitting: Gastroenterology

## 2020-11-02 NOTE — Telephone Encounter (Signed)
Anita, please advise. thanks 

## 2020-11-05 ENCOUNTER — Encounter: Payer: Self-pay | Admitting: Dermatology

## 2020-11-07 NOTE — Telephone Encounter (Signed)
I have been told by [employer] that I need the claim # and the amount of the claim before they can try and help me

## 2020-11-07 NOTE — Telephone Encounter (Signed)
I called [employer] with the Claim # [id number] for code [id number] and they explained to me that  Line 1 for code 94060 the amount was paid 256.61 on 10/24/2020 Line 4 for code A 9270 was denied for $ 0.50 not a covered expense and the patient should not be billed

## 2020-11-07 NOTE — Telephone Encounter (Signed)
Jane Riley, please see patient email that includes claim number

## 2020-11-12 ENCOUNTER — Other Ambulatory Visit: Payer: Self-pay

## 2020-11-12 ENCOUNTER — Ambulatory Visit: Payer: Medicare Other | Admitting: Dermatology

## 2020-11-12 DIAGNOSIS — L820 Inflamed seborrheic keratosis: Secondary | ICD-10-CM

## 2020-11-12 DIAGNOSIS — L821 Other seborrheic keratosis: Secondary | ICD-10-CM

## 2020-11-12 DIAGNOSIS — L814 Other melanin hyperpigmentation: Secondary | ICD-10-CM

## 2020-11-12 NOTE — Progress Notes (Addendum)
   Follow-Up Visit   Subjective  Jane Riley is a 70 y.o. female who presents for the following: ISK vs Dermatitis (L dorsal hand Mometasone cr x 2 wks, still gets itchy/tingling, spinal mid back at braline pt thinks cleared with LN2).  Part of it was biopsied in past and showed solar lentigo.   The following portions of the chart were reviewed this encounter and updated as appropriate:       Review of Systems:  No other skin or systemic complaints except as noted in HPI or Assessment and Plan.  Objective  Well appearing patient in no apparent distress; mood and affect are within normal limits.  A focused examination was performed including L hand. Relevant physical exam findings are noted in the Assessment and Plan.  Objective  Left dorsum hand x 1: 2.0cm dusky pink brown patch slight irregular pigment, slightly waxy  Images     Assessment & Plan  Inflamed seborrheic keratosis Left dorsum hand x 1  Vrs Solar lentigo, Irritated  Some improvement with topical, but still symptomatic.  Recommend daily broad spectrum sunscreen SPF 30+ to sun-exposed areas, reapply every 2 hours as needed. Call for new or changing lesions.    Destruction of lesion - Left dorsum hand x 1  Destruction method: cryotherapy   Informed consent: discussed and consent obtained   Lesion destroyed using liquid nitrogen: Yes   Region frozen until ice ball extended beyond lesion: Yes   Outcome: patient tolerated procedure well with no complications   Post-procedure details: wound care instructions given   Additional details:  Prior to procedure, discussed risks of blister formation, small wound, skin dyspigmentation, or rare scar following cryotherapy.     Other Related Medications mometasone (ELOCON) 0.1 % cream  Seborrheic Keratoses - Stuck-on, waxy, tan-brown macules and papules - Discussed benign etiology and prognosis. - Observe - Call for any changes   Return in about 2 months (around  01/10/2021) for recheck ISK/lentigo.    I, Othelia Pulling, RMA, am acting as scribe for Brendolyn Patty, MD . Documentation: I have reviewed the above documentation for accuracy and completeness, and I agree with the above.  Brendolyn Patty MD

## 2021-01-07 ENCOUNTER — Ambulatory Visit: Payer: Medicare Other | Admitting: Dermatology

## 2021-02-28 ENCOUNTER — Other Ambulatory Visit: Payer: Self-pay | Admitting: Family Medicine

## 2021-02-28 DIAGNOSIS — Z1231 Encounter for screening mammogram for malignant neoplasm of breast: Secondary | ICD-10-CM

## 2021-04-01 ENCOUNTER — Other Ambulatory Visit: Payer: Self-pay

## 2021-04-01 ENCOUNTER — Ambulatory Visit
Admission: RE | Admit: 2021-04-01 | Discharge: 2021-04-01 | Disposition: A | Discharge status: Home / Self Care | Payer: Medicare Other | Source: Ambulatory Visit | Attending: Family Medicine | Admitting: Family Medicine

## 2021-04-01 DIAGNOSIS — Z1231 Encounter for screening mammogram for malignant neoplasm of breast: Secondary | ICD-10-CM | POA: Insufficient documentation

## 2021-04-01 IMAGING — MG DIGITAL SCREENING BILATERAL MAMMOGRAM WITH TOMO AND CAD
6 of 12 series · 6 of 36 positions shown · non-contrast
Comparison: Previous exam(s).

CLINICAL DATA: Screening.

EXAM:
DIGITAL SCREENING BILATERAL MAMMOGRAM WITH TOMO AND CAD

[R MLO synth-2D (1 of 2)]
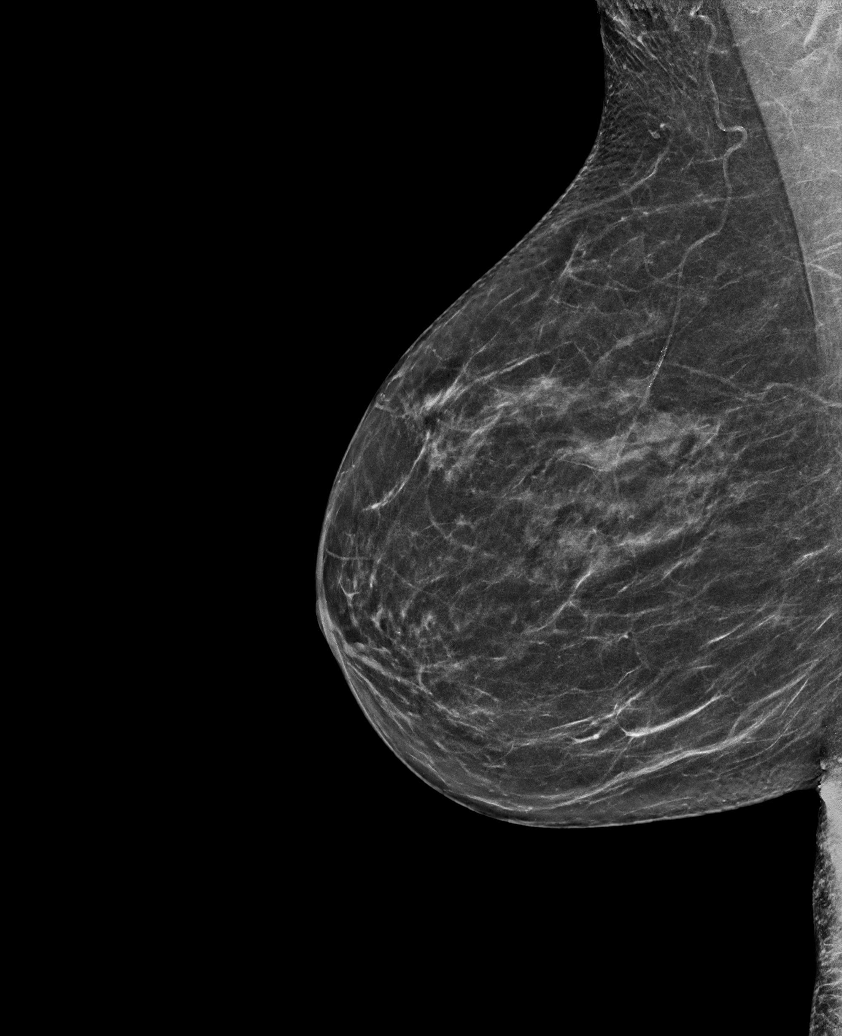

[L CC synth-2D]
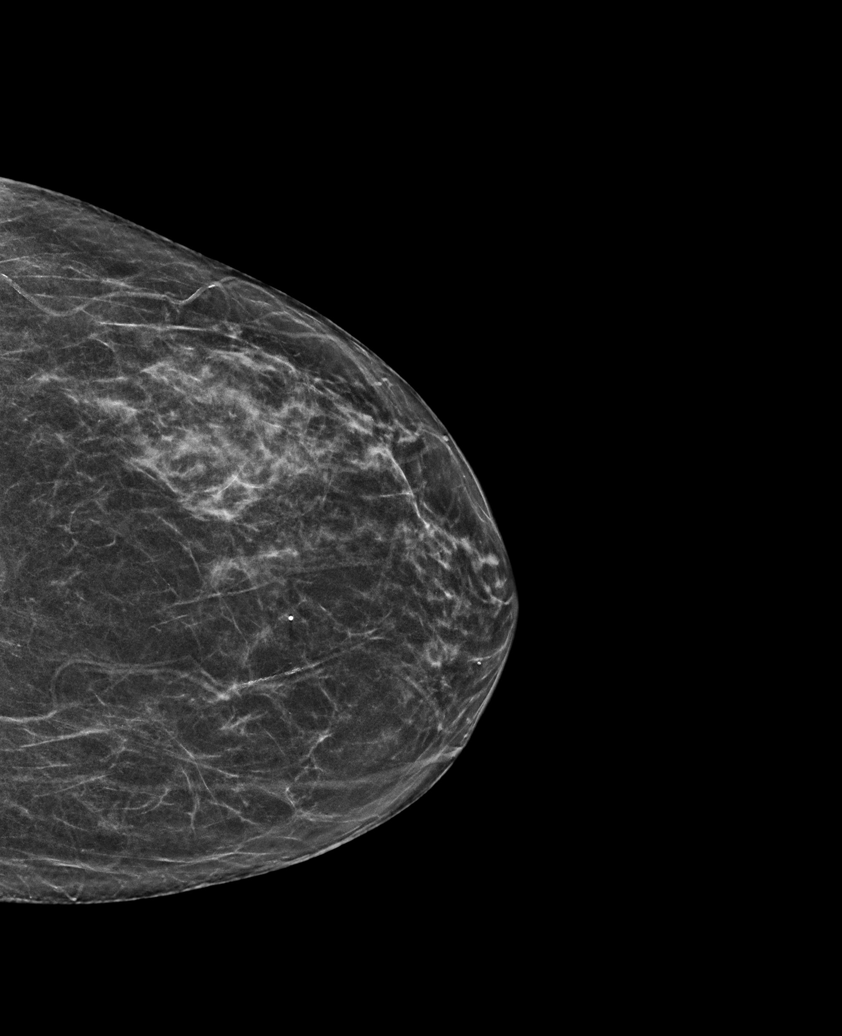

[L MLO synth-2D (1 of 2)]
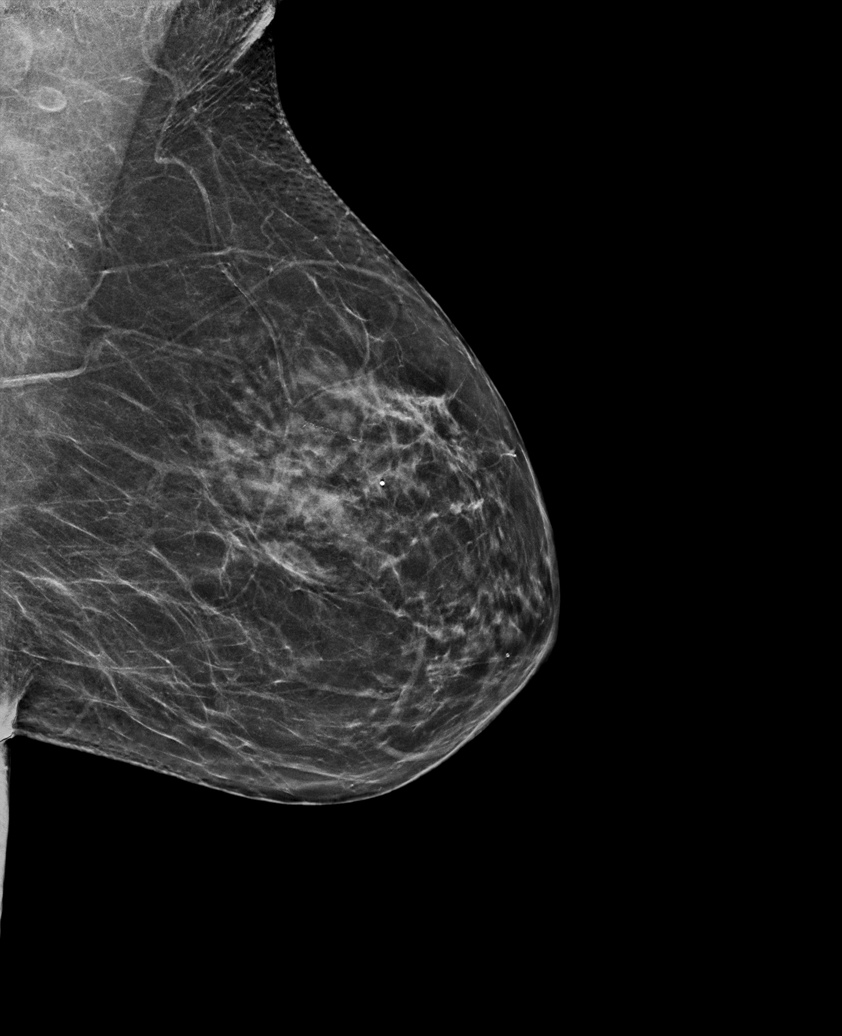

[L MLO synth-2D (2 of 2)]
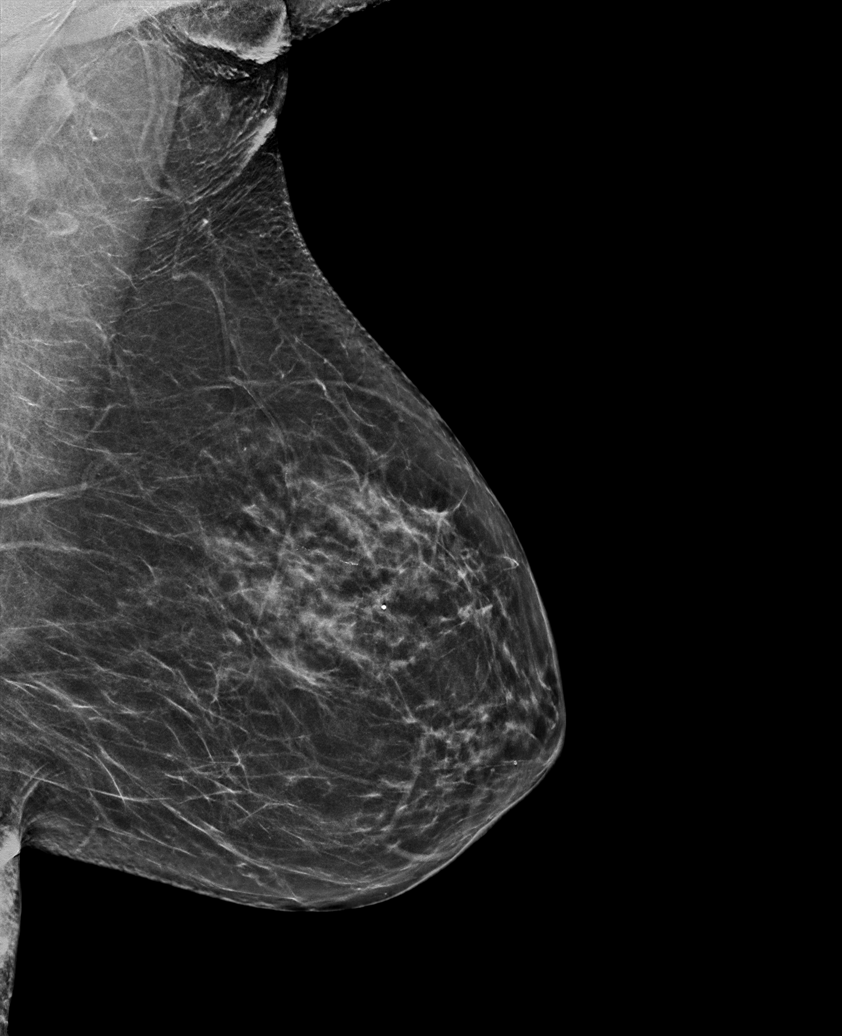

[R CC synth-2D]
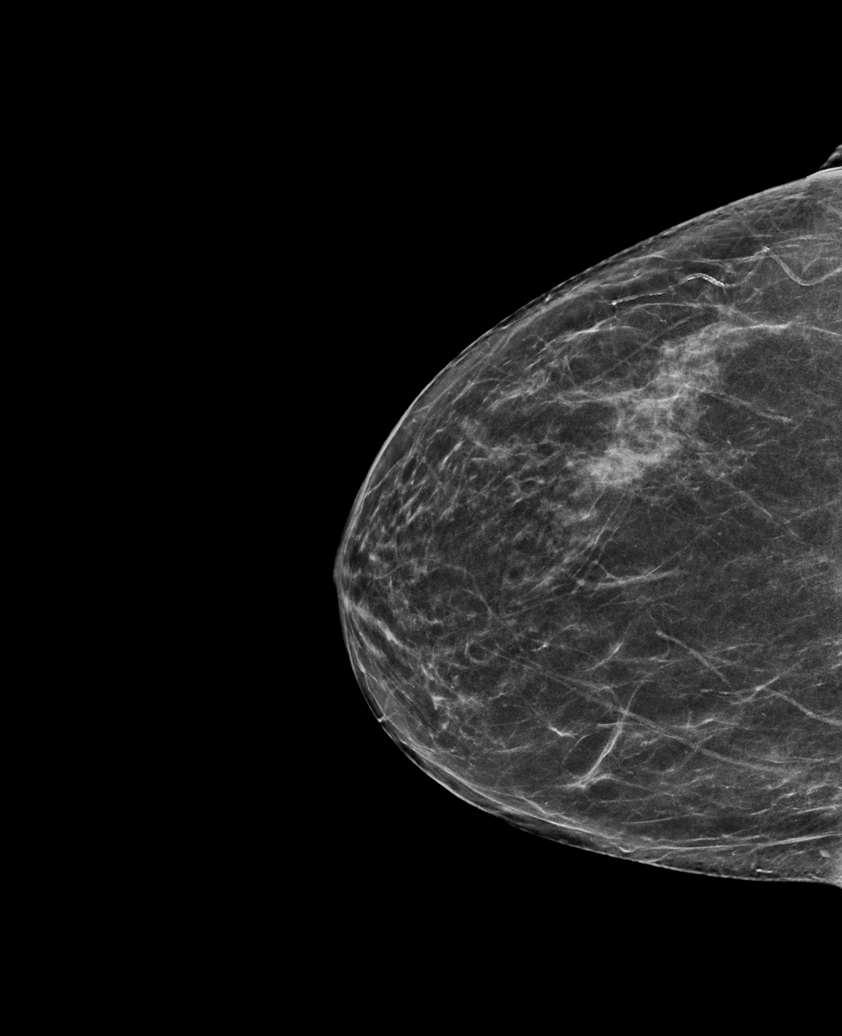

[R MLO synth-2D (2 of 2)]
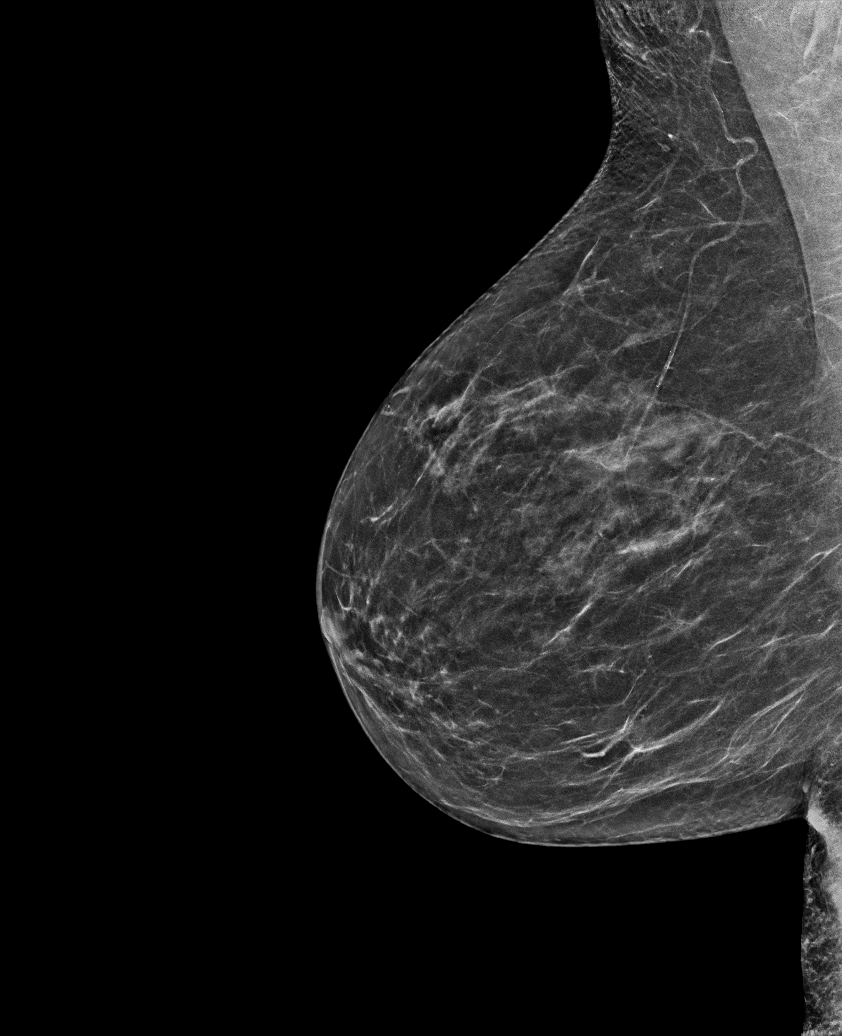

[6 of 36 positions shown; findings below may reference images not displayed]

ACR Breast Density Category c: The breast tissue is heterogeneously
dense, which may obscure small masses.
FINDINGS: There are no findings suspicious for malignancy. Images were
processed with CAD.
IMPRESSION: No mammographic evidence of malignancy. A result letter of this
screening mammogram will be mailed directly to the patient.

RECOMMENDATION:
Screening mammogram in one year. (Code:[id number])

BI-RADS CATEGORY  1: Negative.

## 2021-05-06 ENCOUNTER — Other Ambulatory Visit: Payer: Self-pay

## 2021-05-06 ENCOUNTER — Ambulatory Visit: Payer: Medicare Other | Admitting: Dermatology

## 2021-05-06 DIAGNOSIS — L820 Inflamed seborrheic keratosis: Secondary | ICD-10-CM

## 2021-05-06 DIAGNOSIS — L578 Other skin changes due to chronic exposure to nonionizing radiation: Secondary | ICD-10-CM

## 2021-05-06 DIAGNOSIS — Z1283 Encounter for screening for malignant neoplasm of skin: Secondary | ICD-10-CM

## 2021-05-06 DIAGNOSIS — D2262 Melanocytic nevi of left upper limb, including shoulder: Secondary | ICD-10-CM | POA: Diagnosis not present

## 2021-05-06 DIAGNOSIS — L814 Other melanin hyperpigmentation: Secondary | ICD-10-CM

## 2021-05-06 DIAGNOSIS — D1800 Hemangioma unspecified site: Secondary | ICD-10-CM

## 2021-05-06 DIAGNOSIS — D489 Neoplasm of uncertain behavior, unspecified: Secondary | ICD-10-CM

## 2021-05-06 DIAGNOSIS — D2239 Melanocytic nevi of other parts of face: Secondary | ICD-10-CM | POA: Diagnosis not present

## 2021-05-06 DIAGNOSIS — D229 Melanocytic nevi, unspecified: Secondary | ICD-10-CM

## 2021-05-06 DIAGNOSIS — L738 Other specified follicular disorders: Secondary | ICD-10-CM

## 2021-05-06 DIAGNOSIS — L821 Other seborrheic keratosis: Secondary | ICD-10-CM

## 2021-05-06 DIAGNOSIS — Z86018 Personal history of other benign neoplasm: Secondary | ICD-10-CM

## 2021-05-06 NOTE — Progress Notes (Signed)
Follow-Up Visit   Subjective  Jane Riley is a 70 y.o. female who presents for the following: Annual Exam (Patient states she has a spot nose, scaly spot at abdomen, spot at neck, and left arm. /Patient ). Spots on neck and arm are new and get irritated.  She has h/o dysplastic nevus  Patient here for full body skin exam and skin cancer screening.  The following portions of the chart were reviewed this encounter and updated as appropriate:        Objective  Well appearing patient in no apparent distress; mood and affect are within normal limits.  A full examination was performed including scalp, head, eyes, ears, nose, lips, neck, chest, axillae, abdomen, back, buttocks, bilateral upper extremities, bilateral lower extremities, hands, feet, fingers, toes, fingernails, and toenails. All findings within normal limits unless otherwise noted below.  left shoulder 6 mm brown macule with slightly irregular pigment      left wrist x 1, left neck x 3, posterior neck x 1, left anterior thigh x 1, (6) Erythematous keratotic or waxy stuck-on papule   abdomen Waxy tan patch      Mid Tip of Nose 2 mm pink flesh papule   Assessment & Plan  Neoplasm of uncertain behavior left shoulder  Epidermal / dermal shaving  Lesion diameter (cm):  0.6 Informed consent: discussed and consent obtained   Patient was prepped and draped in usual sterile fashion: Area prepped with alcohol. Anesthesia: the lesion was anesthetized in a standard fashion   Anesthetic:  1% lidocaine w/ epinephrine 1-100,000 buffered w/ 8.4% NaHCO3 Instrument used: flexible razor blade   Hemostasis achieved with: pressure, aluminum chloride and electrodesiccation   Outcome: patient tolerated procedure well   Post-procedure details: wound care instructions given   Post-procedure details comment:  Ointment and small bandage applied.   Specimen 1 - Surgical pathology Differential Diagnosis: R/o lentigo vs nevus vs  dysplasia   Check Margins: No  lentigo vs nevus r/o dysplasia     Inflamed seborrheic keratosis left wrist x 1, left neck x 3, posterior neck x 1, left anterior thigh x 1,  Destruction of lesion - left wrist x 1, left neck x 3, posterior neck x 1, left anterior thigh x 1,  Destruction method: cryotherapy   Informed consent: discussed and consent obtained   Lesion destroyed using liquid nitrogen: Yes   Region frozen until ice ball extended beyond lesion: Yes   Outcome: patient tolerated procedure well with no complications   Post-procedure details: wound care instructions given   Additional details:  Prior to procedure, discussed risks of blister formation, small wound, skin dyspigmentation, or rare scar following cryotherapy. Recommend Vaseline ointment to treated areas while healing.   Related Medications mometasone (ELOCON) 0.1 % cream Apply twice daily to affected areas for itch.  Seborrheic keratosis abdomen  Reassured benign age-related growth.  Recommend observation.  Discussed cryotherapy if spot(s) become irritated or inflamed.  Fibrous papule of nose Mid Tip of Nose  vrs rosacea, benign-appearing, observe  Apply soolantra cream to affected area nightly Call if changes  Sample given [id number] lot # [id number] exp date     Lentigines - Scattered tan macules - Due to sun exposure - Benign-appering, observe - Recommend daily broad spectrum sunscreen SPF 30+ to sun-exposed areas, reapply every 2 hours as needed. - Call for any changes  Sebaceous Hyperplasia Vs rosacea  at mid face and nose - Small yellow papules with a central dell - Benign -  Observe  Seborrheic Keratoses - Stuck-on, waxy, tan-brown papules and/or plaques  - Benign-appearing - Discussed benign etiology and prognosis. - Observe - Call for any changes  Melanocytic Nevi - Tan-brown and/or pink-flesh-colored symmetric macules and papules - Benign appearing on exam today - Observation - Call  clinic for new or changing moles - Recommend daily use of broad spectrum spf 30+ sunscreen to sun-exposed areas.   Hemangiomas - Red papules - Discussed benign nature - Observe - Call for any changes  Actinic Damage - Chronic condition, secondary to cumulative UV/sun exposure - diffuse scaly erythematous macules with underlying dyspigmentation - Recommend daily broad spectrum sunscreen SPF 30+ to sun-exposed areas, reapply every 2 hours as needed.  - Staying in the shade or wearing long sleeves, sun glasses (UVA+UVB protection) and wide brim hats (4-inch brim around the entire circumference of the hat) are also recommended for sun protection.  - Call for new or changing lesions.  History of Dysplastic Nevi - No evidence of recurrence today L post shoulder, L buttock - Recommend regular full body skin exams - Recommend daily broad spectrum sunscreen SPF 30+ to sun-exposed areas, reapply every 2 hours as needed.  - Call if any new or changing lesions are noted between office visits   Skin cancer screening performed today.  Return in about 1 year (around 05/06/2022) for tbse. I, Ruthell Rummage, CMA, am acting as scribe for Brendolyn Patty, MD.  Documentation: I have reviewed the above documentation for accuracy and completeness, and I agree with the above.  Brendolyn Patty MD

## 2021-05-06 NOTE — Patient Instructions (Addendum)
Cryotherapy Aftercare  Wash gently with soap and water everyday.   Apply Vaseline and Band-Aid daily until healed.     Biopsy Wound Care Instructions  Leave the original bandage on for 24 hours if possible.  If the bandage becomes soaked or soiled before that time, it is OK to remove it and examine the wound.  A small amount of post-operative bleeding is normal.  If excessive bleeding occurs, remove the bandage, place gauze over the site and apply continuous pressure (no peeking) over the area for 30 minutes. If this does not work, please call our clinic as soon as possible or page your doctor if it is after hours.   Once a day, cleanse the wound with soap and water. It is fine to shower. If a thick crust develops you may use a Q-tip dipped into dilute hydrogen peroxide (mix 1:1 with water) to dissolve it.  Hydrogen peroxide can slow the healing process, so use it only as needed.    After washing, apply petroleum jelly (Vaseline) or an antibiotic ointment if your doctor prescribed one for you, followed by a bandage.    For best healing, the wound should be covered with a layer of ointment at all times. If you are not able to keep the area covered with a bandage to hold the ointment in place, this may mean re-applying the ointment several times a day.  Continue this wound care until the wound has healed and is no longer open.   Itching and mild discomfort is normal during the healing process. However, if you develop pain or severe itching, please call our office.   If you have any discomfort, you can take Tylenol (acetaminophen) or ibuprofen as directed on the bottle. (Please do not take these if you have an allergy to them or cannot take them for another reason).  Some redness, tenderness and white or yellow material in the wound is normal healing.  If the area becomes very sore and red, or develops a thick yellow-green material (pus), it may be infected; please notify us.    If you have  stitches, return to clinic as directed to have the stitches removed. You will continue wound care for 2-3 days after the stitches are removed.   Wound healing continues for up to one year following surgery. It is not unusual to experience pain in the scar from time to time during the interval.  If the pain becomes severe or the scar thickens, you should notify the office.    A slight amount of redness in a scar is expected for the first six months.  After six months, the redness will fade and the scar will soften and fade.  The color difference becomes less noticeable with time.  If there are any problems, return for a post-op surgery check at your earliest convenience.  To improve the appearance of the scar, you can use silicone scar gel, cream, or sheets (such as Mederma or Serica) every night for up to one year. These are available over the counter (without a prescription).  Please call our office at [phone] for any questions or concerns.   Melanoma ABCDEs  Melanoma is the most dangerous type of skin cancer, and is the leading cause of death from skin disease.  You are more likely to develop melanoma if you: Have light-colored skin, light-colored eyes, or red or blond hair Spend a lot of time in the sun Tan regularly, either outdoors or in a tanning bed Have  had blistering sunburns, especially during childhood Have a close family member who has had a melanoma Have atypical moles or large birthmarks  Early detection of melanoma is key since treatment is typically straightforward and cure rates are extremely high if we catch it early.   The first sign of melanoma is often a change in a mole or a new dark spot.  The ABCDE system is a way of remembering the signs of melanoma.  A for asymmetry:  The two halves do not match. B for border:  The edges of the growth are irregular. C for color:  A mixture of colors are present instead of an even brown color. D for diameter:  Melanomas are  usually (but not always) greater than 59m - the size of a pencil eraser. E for evolution:  The spot keeps changing in size, shape, and color.  Please check your skin once per month between visits. You can use a small mirror in front and a large mirror behind you to keep an eye on the back side or your body.   If you see any new or changing lesions before your next follow-up, please call to schedule a visit.  Please continue daily skin protection including broad spectrum sunscreen SPF 30+ to sun-exposed areas, reapplying every 2 hours as needed when you're outdoors.   Staying in the shade or wearing long sleeves, sun glasses (UVA+UVB protection) and wide brim hats (4-inch brim around the entire circumference of the hat) are also recommended for sun protection.    If you have any questions or concerns for your doctor, please call our main line at [phone] press option 4 to reach your doctor's medical assistant. If no one answers, please leave a voicemail as directed and we will return your call as soon as possible. Messages left after 4 pm will be answered the following business day.   You may also send [place] message via [facility] We typically respond to MyChart messages within 1-2 business days.  For prescription refills, please ask your pharmacy to contact our office. Our fax number is [phone]  If you have an urgent issue when the clinic is closed that cannot wait until the next business day, you can page your doctor at the number below.    Please note that while we do our best to be available for urgent issues outside of office hours, we are not available 24/7.   If you have an urgent issue and are unable to reach [place] you may choose to seek medical care at your doctor's office, retail clinic, urgent care center, or emergency room.  If you have a medical emergency, please immediately call 911 or go to the emergency department.  Pager Numbers  - Dr. KNehemiah Massed [phone] -  Dr. MLaurence Ferrari [phone] - Dr. SNicole Kindred [phone] In the event of inclement weather, please call our main line at [phone] an update on the status of any delays or closures.  Dermatology Medication Tips: Please keep the boxes that topical medications come in in order to help keep track of the instructions about where and how to use these. Pharmacies typically print the medication instructions only on the boxes and not directly on the medication tubes.   If your medication is too expensive, please contact our office at [phone] 4 or send [place] message through [facility]   We are unable to tell what your co-pay for medications will be in advance as this is different depending  on your insurance coverage. However, we may be able to find a substitute medication at lower cost or fill out paperwork to get insurance to cover a needed medication.   If a prior authorization is required to get your medication covered by your insurance company, please allow [place] 1-2 business days to complete this process.  Drug prices often vary depending on where the prescription is filled and some pharmacies may offer cheaper prices.  The website www.goodrx.com contains coupons for medications through different pharmacies. The prices here do not account for what the cost may be with help from insurance (it may be cheaper with your insurance), but the website can give you the price if you did not use any insurance.  - You can print the associated coupon and take it with your prescription to the pharmacy.  - You may also stop by our office during regular business hours and pick up a GoodRx coupon card.  - If you need your prescription sent electronically to a different pharmacy, notify our office through [facility] or by phone at [phone] option 4.

## 2021-05-08 ENCOUNTER — Telehealth: Payer: Self-pay

## 2021-05-08 NOTE — Telephone Encounter (Signed)
Advised patient of bx results. Appt scheduled for 06/03/21 at 8:45am for shave removal.

## 2021-05-08 NOTE — Telephone Encounter (Signed)
-----   Message from Brendolyn Patty, MD sent at 05/08/2021  9:24 AM EDT ----- Skin , left shoulder DYSPLASTIC NEVUS WITH MODERATE TO SEVERE ATYPIA, IRRITATED, CLOSE TO MARGIN  Mod/severe atypical mole- need repeat shave removal - please call patient

## 2021-06-03 ENCOUNTER — Other Ambulatory Visit: Payer: Self-pay

## 2021-06-03 ENCOUNTER — Encounter: Payer: Self-pay | Admitting: Dermatology

## 2021-06-03 ENCOUNTER — Ambulatory Visit: Payer: Medicare Other | Admitting: Dermatology

## 2021-06-03 DIAGNOSIS — D2262 Melanocytic nevi of left upper limb, including shoulder: Secondary | ICD-10-CM | POA: Diagnosis not present

## 2021-06-03 DIAGNOSIS — L578 Other skin changes due to chronic exposure to nonionizing radiation: Secondary | ICD-10-CM

## 2021-06-03 DIAGNOSIS — D239 Other benign neoplasm of skin, unspecified: Secondary | ICD-10-CM

## 2021-06-03 NOTE — Progress Notes (Signed)
   Follow-Up Visit   Subjective  Jane Riley is a 70 y.o. female who presents for the following: Dysplastic Nevus moderate to severe, bx proven (L shoulder, pt presents for shave removal).   The following portions of the chart were reviewed this encounter and updated as appropriate:       Review of Systems:  No other skin or systemic complaints except as noted in HPI or Assessment and Plan.  Objective  Well appearing patient in no apparent distress; mood and affect are within normal limits.  A focused examination was performed including left shoulder. Relevant physical exam findings are noted in the Assessment and Plan.  Left Shoulder Pink biopsy site.   Assessment & Plan  Dysplastic nevus Left Shoulder  Moderate to severe atypia, biopsy proven.   Epidermal / dermal shaving - Left Shoulder  Lesion diameter (cm):  1.1 Informed consent: discussed and consent obtained   Patient was prepped and draped in usual sterile fashion: Area prepped with alcohol. Anesthesia: the lesion was anesthetized in a standard fashion   Anesthetic:  1% lidocaine w/ epinephrine 1-100,000 buffered w/ 8.4% NaHCO3 Instrument used: flexible razor blade   Hemostasis achieved with: pressure, aluminum chloride and electrodesiccation   Outcome: patient tolerated procedure well   Post-procedure details: wound care instructions given   Post-procedure details comment:  Ointment and small bandage applied  Actinic Damage - chronic, secondary to cumulative UV radiation exposure/sun exposure over time - diffuse scaly erythematous macules with underlying dyspigmentation - Recommend daily broad spectrum sunscreen SPF 30+ to sun-exposed areas, reapply every 2 hours as needed.  - Recommend staying in the shade or wearing long sleeves, sun glasses (UVA+UVB protection) and wide brim hats (4-inch brim around the entire circumference of the hat). - Call for new or changing lesions.   Return in about 6 months (around  12/03/2021) for recheck DN left shoulder.  IJamesetta Orleans, CMA, am acting as scribe for Brendolyn Patty, MD .  Documentation: I have reviewed the above documentation for accuracy and completeness, and I agree with the above.  Brendolyn Patty MD

## 2021-06-03 NOTE — Patient Instructions (Signed)
Wound Care Instructions  Cleanse wound gently with soap and water once a day then pat dry with clean gauze. Apply a thing coat of Petrolatum (petroleum jelly, "Vaseline") over the wound (unless you have an allergy to this). We recommend that you use a new, sterile tube of Vaseline. Do not pick or remove scabs. Do not remove the yellow or white "healing tissue" from the base of the wound.  Cover the wound with fresh, clean, nonstick gauze and secure with paper tape. You may use Band-Aids in place of gauze and tape if the would is small enough, but would recommend trimming much of the tape off as there is often too much. Sometimes Band-Aids can irritate the skin.  You should call the office for your biopsy report after 1 week if you have not already been contacted.  If you experience any problems, such as abnormal amounts of bleeding, swelling, significant bruising, significant pain, or evidence of infection, please call the office immediately.  FOR ADULT SURGERY PATIENTS: If you need something for pain relief you may take 1 extra strength Tylenol (acetaminophen) AND 2 Ibuprofen (200mg each) together every 4 hours as needed for pain. (do not take these if you are allergic to them or if you have a reason you should not take them.) Typically, you may only need pain medication for 1 to 3 days.   If you have any questions or concerns for your doctor, please call our main line at [phone] and press option 4 to reach your doctor's medical assistant. If no one answers, please leave a voicemail as directed and we will return your call as soon as possible. Messages left after 4 pm will be answered the following business day.   You may also send us a message via MyChart. We typically respond to MyChart messages within 1-2 business days.  For prescription refills, please ask your pharmacy to contact our office. Our fax number is [phone].  If you have an urgent issue when the clinic is closed that  cannot wait until the next business day, you can page your doctor at the number below.    Please note that while we do our best to be available for urgent issues outside of office hours, we are not available 24/7.   If you have an urgent issue and are unable to reach us, you may choose to seek medical care at your doctor's office, retail clinic, urgent care center, or emergency room.  If you have a medical emergency, please immediately call 911 or go to the emergency department.  Pager Numbers  - Dr. Kowalski: [phone]  - Dr. Moye: [phone]  - Dr. Stewart: [phone]  In the event of inclement weather, please call our main line at [phone] for an update on the status of any delays or closures.  Dermatology Medication Tips: Please keep the boxes that topical medications come in in order to help keep track of the instructions about where and how to use these. Pharmacies typically print the medication instructions only on the boxes and not directly on the medication tubes.   If your medication is too expensive, please contact our office at [phone] option 4 or send us a message through MyChart.   We are unable to tell what your co-pay for medications will be in advance as this is different depending on your insurance coverage. However, we may be able to find a substitute medication at lower cost or fill out paperwork to get insurance to cover a needed   medication.   If a prior authorization is required to get your medication covered by your insurance company, please allow us 1-2 business days to complete this process.  Drug prices often vary depending on where the prescription is filled and some pharmacies may offer cheaper prices.  The website www.goodrx.com contains coupons for medications through different pharmacies. The prices here do not account for what the cost may be with help from insurance (it may be cheaper with your insurance), but the website can give you the  price if you did not use any insurance.  - You can print the associated coupon and take it with your prescription to the pharmacy.  - You may also stop by our office during regular business hours and pick up a GoodRx coupon card.  - If you need your prescription sent electronically to a different pharmacy, notify our office through [facility] MyChart or by phone at [phone] option 4.   

## 2021-06-04 ENCOUNTER — Ambulatory Visit: Payer: Medicare Other | Admitting: Gastroenterology

## 2021-06-11 ENCOUNTER — Telehealth: Payer: Self-pay

## 2021-06-11 NOTE — Telephone Encounter (Signed)
Advised pt of bx results/sh ?

## 2021-06-11 NOTE — Telephone Encounter (Signed)
-----   Message from Brendolyn Patty, MD sent at 06/11/2021 10:10 AM EDT ----- Skin (M), left shoulder NO RESIDUAL DYSPLASTIC NEVUS, MARGINS FREE

## 2021-07-09 ENCOUNTER — Ambulatory Visit: Payer: Medicare Other | Admitting: Gastroenterology

## 2021-07-22 ENCOUNTER — Other Ambulatory Visit: Payer: Self-pay

## 2021-07-22 ENCOUNTER — Ambulatory Visit: Payer: Medicare Other | Admitting: Gastroenterology

## 2021-07-22 VITALS — BP 144/76 | HR 76 | Temp 97.6°F | Ht 64.0 in | Wt 154.0 lb

## 2021-07-22 DIAGNOSIS — R197 Diarrhea, unspecified: Secondary | ICD-10-CM | POA: Diagnosis not present

## 2021-07-22 DIAGNOSIS — K219 Gastro-esophageal reflux disease without esophagitis: Secondary | ICD-10-CM

## 2021-07-22 MED ORDER — OMEPRAZOLE 20 MG PO CPDR: 20.0000 mg | DELAYED_RELEASE_CAPSULE | Freq: Every day | ORAL | 3 refills | Status: DC

## 2021-07-22 MED ORDER — COLESTIPOL HCL 1 G PO TABS: 2.0000 g | ORAL_TABLET | Freq: Two times a day (BID) | ORAL | 2 refills | Status: DC

## 2021-07-22 NOTE — Progress Notes (Signed)
Jane Antigua, MD [address]  [place]  [place], [place] [address]  Main: [phone]  Fax: [phone]   Primary Care Physician: Dion Body, MD   Chief Complaint  Patient presents with   Follow-up    Pt reports she has been taking Omeprazole QD w/ relief of GERD Sx... But she is still having loose stools immediately after eating    HPI: HALCYON HECK is a 70 y.o. female here for follow-up of reflux and loose stools.  States her Zegerid was recently changed to omeprazole but he removed care provider due to changes in insurance coverage.  She is taking 40 mg in the evenings and this controls her symptoms well.  She was having some globus sensation symptoms prior to this.  No dysphagia.  Reports having loose stools anywhere from 2-4 bowel movements a day.  No blood in stool.  No nausea or vomiting.  Colonoscopy did not show any evidence of microscopic colitis.  See EGD and colonoscopy reports from 2021.  No unintentional weight loss  Most recent labs show normal liver enzymes, 07/03/2021 and normal TSH  ROS: All ROS reviewed and negative except as per HPI   Past Medical History:  Diagnosis Date   Arthritis    neck, hands   CRPS (complex regional pain syndrome)    Depression    Dysplastic nevus 12/07/2007   left post shoulder. Severe atypia. Excised: 01/17/2008, no residual neoplasm   Dysplastic nevus 12/22/2017   left buttock. Moderate atypia, limited margins free   Dysplastic nevus 05/06/2021   left shoulder. Moderate to severe. Shave removal  06/03/21   Family history of adverse reaction to anesthesia    mother nausea and vomiting   GERD (gastroesophageal reflux disease)    Hypercholesteremia    Hypertension    Osteoporosis    PONV (postoperative nausea and vomiting)     Past Surgical History:  Procedure Laterality Date   ABDOMINAL HYSTERECTOMY     CATARACT EXTRACTION W/PHACO Right 01/12/2017   Procedure: CATARACT EXTRACTION PHACO AND INTRAOCULAR  LENS PLACEMENT ([place]) Right Restor Lens;  Surgeon: Ronnell Freshwater, MD;  Location: [facility];  Service: Ophthalmology;  Laterality: Right;  Restor Lens   CESAREAN SECTION     x2   CHOLECYSTECTOMY     COLONOSCOPY WITH PROPOFOL N/A 05/10/2020   Procedure: COLONOSCOPY WITH PROPOFOL;  Surgeon: Virgel Manifold, MD;  Location: [facility];  Service: Endoscopy;  Laterality: N/A;   ESOPHAGOGASTRODUODENOSCOPY (EGD) WITH PROPOFOL N/A 03/24/2018   Procedure: ESOPHAGOGASTRODUODENOSCOPY (EGD) WITH PROPOFOL;  Surgeon: Virgel Manifold, MD;  Location: [facility];  Service: Endoscopy;  Laterality: N/A;   ESOPHAGOGASTRODUODENOSCOPY (EGD) WITH PROPOFOL N/A 05/10/2020   Procedure: ESOPHAGOGASTRODUODENOSCOPY (EGD) WITH PROPOFOL;  Surgeon: Virgel Manifold, MD;  Location: [facility];  Service: Endoscopy;  Laterality: N/A;   FLEXIBLE BRONCHOSCOPY N/A 05/27/2018   Procedure: FLEXIBLE BRONCHOSCOPY;  Surgeon: [name], MD;  Location: [facility];  Service: Pulmonary;  Laterality: N/A;   NECK SURGERY  2008   4 level fusion.  plates and screws   TONSILLECTOMY     WRIST SURGERY Left 2014   implant but unsure of the type    Prior to Admission medications   Medication Sig Start Date End Date Taking? Authorizing Provider  Biotin 5000 MCG TABS Take 5,000 mcg by mouth daily.    Yes [provider]  carboxymethylcellulose (REFRESH TEARS) 0.5 % SOLN Apply to eye.   Yes [provider]  Cholecalciferol (VITAMIN  D3) 1000 units CAPS Take 1,000 Units by mouth daily.    Yes [provider]  citalopram (CELEXA) 20 MG tablet Take 20 mg by mouth daily.    Yes [provider]  Coenzyme Q10 (COQ10) 200 MG CAPS Take 200 mg by mouth daily.    Yes [provider]  colestipol (COLESTID) 1 g tablet Take 2 tablets (2 g total) by mouth 2 (two) times daily for 90 doses. 07/22/21 09/05/21 Yes Virgel Manifold, MD  diclofenac (CATAFLAM) 50 MG tablet  Take 50 mg by mouth 3 (three) times daily.   Yes [provider]  EUCRISA 2 % OINT  12/27/18  Yes [provider]  fluticasone (FLOVENT HFA) 220 MCG/ACT inhaler TAKE 2 PUFFS BY MOUTH TWICE A DAY 07/10/20  Yes Kasa, Maretta Bees, MD  levocetirizine (XYZAL) 5 MG tablet Take 1 tablet (5 mg total) by mouth every evening. 10/07/17  Yes Ratcliffe, Heather R, PA-C  lisinopril-hydrochlorothiazide (PRINZIDE,ZESTORETIC) 10-12.5 MG tablet Take 1 tablet by mouth daily.   Yes [provider]  lovastatin (ALTOPREV) 60 MG 24 hr tablet Take 60 mg by mouth at bedtime.   Yes [provider]  Magnesium Oxide 500 MG TABS Take 500 mg by mouth daily with breakfast.    Yes [provider]  mometasone (ELOCON) 0.1 % cream Apply twice daily to affected areas for itch. 10/09/20  Yes Brendolyn Patty, MD  Multiple Vitamin (MULTIVITAMIN) tablet Take 1 tablet by mouth daily.   Yes [provider]  mupirocin ointment (BACTROBAN) 2 % Place 1 application into the nose 2 (two) times daily. To insides of nose as needed 04/25/20  Yes Brendolyn Patty, MD  Omega-3 Fatty Acids (KP FISH OIL) 1200 MG CAPS Take 1,200 mg by mouth daily.    Yes [provider]  omeprazole (PRILOSEC) 20 MG capsule Take 1 capsule (20 mg total) by mouth daily. 07/22/21  Yes Virgel Manifold, MD  raloxifene (EVISTA) 60 MG tablet Take 60 mg by mouth daily.   Yes [provider]  vitamin E 400 UNIT capsule Take 400 Units by mouth daily.   Yes [provider]    Family History  Problem Relation Age of Onset   Breast cancer Cousin    Breast cancer Cousin      Social History   Tobacco Use   Smoking status: Former    Packs/day: 0.25    Years: 10.00    Pack years: 2.50    Types: Cigarettes    Quit date: 10/06/2006    Years since quitting: 14.8   Smokeless tobacco: Never  Vaping Use   Vaping Use: Never used  Substance Use Topics   Alcohol use: Yes    Alcohol/week: 7.0 standard drinks     Types: 7 Glasses of wine per week   Drug use: Never    Allergies as of 07/22/2021 - Review Complete 07/22/2021  Allergen Reaction Noted   Codeine Nausea And Vomiting and Nausea Only 02/20/2014   Doxycycline  03/10/2018   Adhesive [tape] Other (See Comments) 01/05/2017   Amlodipine Swelling 05/21/2018   Amoxicillin Rash 11/29/2014   Nsaids Other (See Comments) 02/20/2014   Other Itching 02/20/2014   Oxycodone Itching 05/21/2018   Pravastatin Other (See Comments) 02/20/2014    Physical Examination:  Constitutional: General:   Alert,  Well-developed, well-nourished, pleasant and cooperative in NAD BP (!) 144/76   Pulse 76   Temp 97.6 F (36.4 C) (Temporal)   Ht _0  (1.626 m)  Wt 154 lb (69.9 kg)   BMI 26.43 kg/m   Respiratory: Normal respiratory effort  Gastrointestinal:  Soft, non-tender and non-distended without masses, hepatosplenomegaly or hernias noted.  No guarding or rebound tenderness.     Cardiac: No clubbing or edema.  No cyanosis. Normal posterior tibial pedal pulses noted.  Psych:  Alert and cooperative. Normal mood and affect.  Musculoskeletal:  Normal gait. Head normocephalic, atraumatic. Symmetrical without gross deformities. 5/5 Lower extremity strength bilaterally.  Skin: Warm. Intact without significant lesions or rashes. No jaundice.  Neck: Supple, trachea midline  Lymph: No cervical lymphadenopathy  Psych:  Alert and oriented x3, Alert and cooperative. Normal mood and affect.  Labs: CMP     Component Value Date/Time   NA 142 05/28/2017 1120   K 3.7 05/28/2017 1120   CL 100 05/28/2017 1120   CO2 23 05/28/2017 1120   GLUCOSE 108 (H) 05/28/2017 1120   BUN 13 05/28/2017 1120   CREATININE 0.76 05/28/2017 1120   CALCIUM 9.5 05/28/2017 1120   PROT 6.8 05/28/2017 1120   ALBUMIN 4.7 05/28/2017 1120   AST 28 05/28/2017 1120   ALT 19 05/28/2017 1120   ALKPHOS 53 05/28/2017 1120   BILITOT 0.3 05/28/2017 1120   GFRNONAA 83 05/28/2017 1120    GFRAA 96 05/28/2017 1120   Lab Results  Component Value Date   WBC 11.6 (H) 07/03/2017   HGB 12.9 07/03/2017   HCT 38.1 07/03/2017   MCV 91 07/03/2017   PLT 167 07/03/2017  07/18/2021 TSH 3.6  07/03/2021 AST 31, ALT 22, alk phos 51, albumin 4.4  Imaging Studies:   Assessment and Plan:   LOURENE HOSTON is a 70 y.o. y/o female here for follow-up of reflux and reporting loose stools  Reflux is well controlled Patient agreeable to decrease dose to omeprazole 20 mg once daily Previously she does have severe symptoms without PPI and therefore may not be able to discontinue PPI completely as we have tried this before and failed If symptoms return on lower dose patient advised to call us  Patient educated on acid reflux lifestyle modification, including using a bed wedge, not eating 3 hrs before bedtime, diet modifications, and handout given for the same.   Her loose stools may be due to postcholecystectomy diarrhea.  Colestid prescribed.  If symptoms not well controlled after a few weeks, patient advised to call us and we can change the dose  Will also get fecal pancreatic elastase testing  Previous biopsies for microscopic colitis were negative and patient has undergone pelvic floor physical therapy as well in the past  (Risks of PPI use were discussed with patient including bone loss, C. Diff diarrhea, pneumonia, infections, CKD, electrolyte abnormalities.  Pt. Verbalizes understanding and chooses to continue the medication.)   Dr Jane Riley

## 2021-07-26 LAB — PANCREATIC ELASTASE, FECAL: Pancreatic Elastase, Fecal: 90 ug Elast./g — ABNORMAL LOW (ref >200)

## 2021-07-29 DIAGNOSIS — K8689 Other specified diseases of pancreas: Secondary | ICD-10-CM

## 2021-07-30 MED ORDER — CREON 24000-76000 UNITS PO CPEP: ORAL_CAPSULE | ORAL | 0 refills | Status: DC

## 2021-07-30 NOTE — Addendum Note (Signed)
Addended by: Vonda Antigua on: 07/30/2021 10:22 AM   Modules accepted: Orders

## 2021-08-14 NOTE — Addendum Note (Signed)
Addended by: Lurlean Nanny on: 08/14/2021 12:40 PM   Modules accepted: Orders

## 2021-08-15 LAB — BASIC METABOLIC PANEL
BUN/Creatinine Ratio: 17 (ref 12–28)
BUN: 13 mg/dL (ref 8–27)
CO2: 25 mmol/L (ref 20–29)
Calcium: 9.8 mg/dL (ref 8.7–10.3)
Chloride: 106 mmol/L (ref 96–106)
Creatinine, Ser: 0.78 mg/dL (ref 0.57–1.00)
Glucose: 95 mg/dL (ref 70–99)
Potassium: 4.4 mmol/L (ref 3.5–5.2)
Sodium: 148 mmol/L — ABNORMAL HIGH (ref 134–144)
eGFR: 82 mL/min/{1.73_m2} (ref >59)

## 2021-08-23 ENCOUNTER — Ambulatory Visit
Admission: RE | Admit: 2021-08-23 | Discharge: 2021-08-23 | Disposition: A | Discharge status: Home / Self Care | Payer: Medicare Other | Source: Ambulatory Visit | Attending: Gastroenterology | Admitting: Gastroenterology

## 2021-08-23 ENCOUNTER — Other Ambulatory Visit: Payer: Self-pay

## 2021-08-23 DIAGNOSIS — K8689 Other specified diseases of pancreas: Secondary | ICD-10-CM | POA: Diagnosis not present

## 2021-08-23 MED ORDER — IOHEXOL 300 MG/ML  SOLN
100.0000 mL | Freq: Once | INTRAMUSCULAR | Status: AC | PRN
  Administered 2021-08-23: 100 mL via INTRAVENOUS

## 2021-08-28 ENCOUNTER — Ambulatory Visit (INDEPENDENT_AMBULATORY_CARE_PROVIDER_SITE_OTHER): Payer: Medicare Other | Admitting: Gastroenterology

## 2021-08-28 ENCOUNTER — Encounter: Payer: Self-pay | Admitting: Gastroenterology

## 2021-08-28 VITALS — BP 126/84 | HR 62 | Temp 98.1°F | Wt 150.0 lb

## 2021-08-28 DIAGNOSIS — K8689 Other specified diseases of pancreas: Secondary | ICD-10-CM | POA: Diagnosis not present

## 2021-08-28 NOTE — Progress Notes (Signed)
Vonda Antigua, MD [address]  [place]  [place], [place] [address]  Main: [phone]  Fax: [phone]   Primary Care Physician: Dion Body, MD  Chief complaint: Diarrhea   HPI: Jane Riley is a 70 y.o. female here for follow-up of diarrhea.  Fecal elastase was abnormal.  Creon has been started and patient is taking 2 pills with meals and 1 with snacks.  She states she saw an immediate improvement in her diarrhea since starting the Creon.  However, when she went for CT scan recently, she started having diarrhea after drinking the contrast.  Stools have been loose since then with 2-3 loose bowel movements yesterday.  Only 1 loose bowel today.  No blood in stool.  She is no longer on colestipol   ROS: All ROS reviewed and negative except as per HPI   Past Medical History:  Diagnosis Date   Arthritis    neck, hands   CRPS (complex regional pain syndrome)    Depression    Dysplastic nevus 12/07/2007   left post shoulder. Severe atypia. Excised: 01/17/2008, no residual neoplasm   Dysplastic nevus 12/22/2017   left buttock. Moderate atypia, limited margins free   Dysplastic nevus 05/06/2021   left shoulder. Moderate to severe. Shave removal  06/03/21   Family history of adverse reaction to anesthesia    mother nausea and vomiting   GERD (gastroesophageal reflux disease)    Hypercholesteremia    Hypertension    Osteoporosis    PONV (postoperative nausea and vomiting)     Past Surgical History:  Procedure Laterality Date   ABDOMINAL HYSTERECTOMY     CATARACT EXTRACTION W/PHACO Right 01/12/2017   Procedure: CATARACT EXTRACTION PHACO AND INTRAOCULAR LENS PLACEMENT ([place]) Right Restor Lens;  Surgeon: Ronnell Freshwater, MD;  Location: [facility];  Service: Ophthalmology;  Laterality: Right;  Restor Lens   CESAREAN SECTION     x2   CHOLECYSTECTOMY     COLONOSCOPY WITH PROPOFOL N/A 05/10/2020   Procedure: COLONOSCOPY WITH PROPOFOL;  Surgeon:  Virgel Manifold, MD;  Location: [facility];  Service: Endoscopy;  Laterality: N/A;   ESOPHAGOGASTRODUODENOSCOPY (EGD) WITH PROPOFOL N/A 03/24/2018   Procedure: ESOPHAGOGASTRODUODENOSCOPY (EGD) WITH PROPOFOL;  Surgeon: Virgel Manifold, MD;  Location: [facility];  Service: Endoscopy;  Laterality: N/A;   ESOPHAGOGASTRODUODENOSCOPY (EGD) WITH PROPOFOL N/A 05/10/2020   Procedure: ESOPHAGOGASTRODUODENOSCOPY (EGD) WITH PROPOFOL;  Surgeon: Virgel Manifold, MD;  Location: [facility];  Service: Endoscopy;  Laterality: N/A;   FLEXIBLE BRONCHOSCOPY N/A 05/27/2018   Procedure: FLEXIBLE BRONCHOSCOPY;  Surgeon: [name], MD;  Location: [facility];  Service: Pulmonary;  Laterality: N/A;   NECK SURGERY  2008   4 level fusion.  plates and screws   TONSILLECTOMY     WRIST SURGERY Left 2014   implant but unsure of the type    Prior to Admission medications   Medication Sig Start Date End Date Taking? Authorizing Provider  Biotin 5000 MCG TABS Take 5,000 mcg by mouth daily.    Yes [provider]  carboxymethylcellulose (REFRESH TEARS) 0.5 % SOLN Apply to eye.   Yes [provider]  Cholecalciferol (VITAMIN D3) 1000 units CAPS Take 1,000 Units by mouth daily.    Yes [provider]  citalopram (CELEXA) 20 MG tablet Take 20 mg by mouth daily.    Yes [provider]  Coenzyme Q10 (COQ10) 200 MG CAPS Take 200 mg by mouth daily.    Yes [provider]  diclofenac (CATAFLAM) 50 MG tablet Take 50 mg by mouth 3 (three) times daily.   Yes [provider]  levocetirizine (XYZAL) 5 MG tablet Take 1 tablet (5 mg total) by mouth every evening. 10/07/17  Yes Ratcliffe, Heather R, PA-C  lisinopril-hydrochlorothiazide (PRINZIDE,ZESTORETIC) 10-12.5 MG tablet Take 1 tablet by mouth daily.   Yes [provider]  lovastatin (ALTOPREV) 60 MG 24 hr tablet Take 60 mg by mouth at bedtime.   Yes [provider]  Magnesium Oxide 500  MG TABS Take 500 mg by mouth daily with breakfast.    Yes [provider]  mupirocin ointment (BACTROBAN) 2 % Place 1 application into the nose 2 (two) times daily. To insides of nose as needed 04/25/20  Yes Brendolyn Patty, MD  Omega-3 Fatty Acids (KP FISH OIL) 1200 MG CAPS Take 1,200 mg by mouth daily.    Yes [provider]  omeprazole (PRILOSEC) 20 MG capsule Take 1 capsule (20 mg total) by mouth daily. 07/22/21  Yes Shaquille Murdy, Lennette Bihari, MD  Pancrelipase, Lip-Prot-Amyl, (CREON) 24000-76000 units CPEP Take 2 capsules (48,000 Units total) by mouth 3 (three) times daily with meals AND 1 capsule (24,000 Units total) with snacks. 07/30/21 10/28/21 Yes Virgel Manifold, MD  raloxifene (EVISTA) 60 MG tablet Take 60 mg by mouth daily.   Yes [provider]  vitamin E 400 UNIT capsule Take 400 Units by mouth daily.   Yes [provider]    Family History  Problem Relation Age of Onset   Breast cancer Cousin    Breast cancer Cousin      Social History   Tobacco Use   Smoking status: Former    Packs/day: 0.25    Years: 10.00    Pack years: 2.50    Types: Cigarettes    Quit date: 10/06/2006    Years since quitting: 14.9   Smokeless tobacco: Never  Vaping Use   Vaping Use: Never used  Substance Use Topics   Alcohol use: Yes    Alcohol/week: 7.0 standard drinks    Types: 7 Glasses of wine per week   Drug use: Never    Allergies as of 08/28/2021 - Review Complete 08/28/2021  Allergen Reaction Noted   Codeine Nausea And Vomiting and Nausea Only 02/20/2014   Doxycycline  03/10/2018   Adhesive [tape] Other (See Comments) 01/05/2017   Amlodipine Swelling 05/21/2018   Amoxicillin Rash 11/29/2014   Nsaids Other (See Comments) 02/20/2014   Other Itching 02/20/2014   Oxycodone Itching 05/21/2018   Pravastatin Other (See Comments) 02/20/2014    Physical Examination:  Constitutional: General:   Alert,  Well-developed, well-nourished, pleasant and  cooperative in NAD BP 126/84   Pulse 62   Temp 98.1 F (36.7 C) (Oral)   Wt 150 lb (68 kg)   BMI 25.75 kg/m   Respiratory: Normal respiratory effort  Gastrointestinal:  Soft, non-tender and non-distended without masses, hepatosplenomegaly or hernias noted.  No guarding or rebound tenderness.     Cardiac: No clubbing or edema.  No cyanosis. Normal posterior tibial pedal pulses noted.  Psych:  Alert and cooperative. Normal mood and affect.  Musculoskeletal:  Normal gait. Head normocephalic, atraumatic. Symmetrical without gross deformities. 5/5 Lower extremity strength bilaterally.  Skin: Warm. Intact without significant lesions or rashes. No jaundice.  Neck: Supple, trachea midline  Lymph: No cervical lymphadenopathy  Psych:  Alert and oriented x3, Alert and cooperative. Normal mood and affect.  Labs: CMP     Component Value Date/Time  NA 148 (H) 08/14/2021 1332   K 4.4 08/14/2021 1332   CL 106 08/14/2021 1332   CO2 25 08/14/2021 1332   GLUCOSE 95 08/14/2021 1332   BUN 13 08/14/2021 1332   CREATININE 0.78 08/14/2021 1332   CALCIUM 9.8 08/14/2021 1332   PROT 6.8 05/28/2017 1120   ALBUMIN 4.7 05/28/2017 1120   AST 28 05/28/2017 1120   ALT 19 05/28/2017 1120   ALKPHOS 53 05/28/2017 1120   BILITOT 0.3 05/28/2017 1120   GFRNONAA 83 05/28/2017 1120   GFRAA 96 05/28/2017 1120   Lab Results  Component Value Date   WBC 11.6 (H) 07/03/2017   HGB 12.9 07/03/2017   HCT 38.1 07/03/2017   MCV 91 07/03/2017   PLT 167 07/03/2017    Imaging Studies:   Assessment and Plan:   BRITTEN PARADY is a 70 y.o. y/o female here for follow-up of loose stools from pancreatic insufficiency  Given that she had immediate improvement of loose stools after starting Creon and loose stools returned after drinking contrast for her CT scan, will stay on current dose of Creon at this time.  However, if in 2 to 3 days she continues to have loose stools, patient advised to increase dose to 3  pills with each meal and continue 1 pill with snacks.  She is currently on 24,000 unit pills, and is taking 40,000 with each meal and 24,000 with snacks  If loose stools become more loose or worsen in consistency within the next 1 to 2 days, patient advised to call us and we may need to consider infectious panel  CT scan did not show any concerning pancreatic lesions or evidence of chronic pancreatitis.  Simple appearing cyst in the right adnexa was noted.  CT scan results will be forwarded to PCP for their review, clinic staff was advised in this regard.  Patient was advised to follow-up with PCP in regard to this result as well    Dr Vonda Antigua

## 2021-09-02 ENCOUNTER — Encounter: Payer: Self-pay | Admitting: Gastroenterology

## 2021-09-02 DIAGNOSIS — R109 Unspecified abdominal pain: Secondary | ICD-10-CM

## 2021-09-03 MED ORDER — PANCRELIPASE (LIP-PROT-AMYL) 36000-114000 UNITS PO CPEP: ORAL_CAPSULE | ORAL | 1 refills | Status: DC

## 2021-09-04 ENCOUNTER — Other Ambulatory Visit: Payer: Self-pay | Admitting: Gastroenterology

## 2021-09-04 MED ORDER — OMEPRAZOLE 40 MG PO CPDR: 40.0000 mg | DELAYED_RELEASE_CAPSULE | Freq: Every day | ORAL | 0 refills | Status: DC

## 2021-09-04 NOTE — Addendum Note (Signed)
Addended by: Lurlean Nanny on: 09/04/2021 02:28 PM   Modules accepted: Orders

## 2021-09-04 NOTE — Addendum Note (Signed)
Addended by: Lurlean Nanny on: 09/04/2021 08:29 AM   Modules accepted: Orders

## 2021-09-05 LAB — CBC
Hematocrit: 42.6 % (ref 34.0–46.6)
Hemoglobin: 14.2 g/dL (ref 11.1–15.9)
MCH: 29 pg (ref 26.6–33.0)
MCHC: 33.3 g/dL (ref 31.5–35.7)
MCV: 87 fL (ref 79–97)
Platelets: 219 10*3/uL (ref 150–450)
RBC: 4.89 x10E6/uL (ref 3.77–5.28)
RDW: 13 % (ref 11.7–15.4)
WBC: 5.7 10*3/uL (ref 3.4–10.8)

## 2021-09-05 LAB — COMPREHENSIVE METABOLIC PANEL
ALT: 16 IU/L (ref 0–32)
AST: 26 IU/L (ref 0–40)
Albumin/Globulin Ratio: 2.3 — ABNORMAL HIGH (ref 1.2–2.2)
Albumin: 4.6 g/dL (ref 3.8–4.8)
Alkaline Phosphatase: 68 IU/L (ref 44–121)
BUN/Creatinine Ratio: 11 — ABNORMAL LOW (ref 12–28)
BUN: 9 mg/dL (ref 8–27)
Bilirubin Total: 0.3 mg/dL (ref 0.0–1.2)
CO2: 23 mmol/L (ref 20–29)
Calcium: 9.7 mg/dL (ref 8.7–10.3)
Chloride: 101 mmol/L (ref 96–106)
Creatinine, Ser: 0.79 mg/dL (ref 0.57–1.00)
Globulin, Total: 2 g/dL (ref 1.5–4.5)
Glucose: 81 mg/dL (ref 70–99)
Potassium: 3.8 mmol/L (ref 3.5–5.2)
Sodium: 140 mmol/L (ref 134–144)
Total Protein: 6.6 g/dL (ref 6.0–8.5)
eGFR: 81 mL/min/{1.73_m2} (ref >59)

## 2021-09-05 MED ORDER — PANCRELIPASE (LIP-PROT-AMYL) 36000-114000 UNITS PO CPEP: ORAL_CAPSULE | ORAL | 1 refills | Status: DC

## 2021-09-05 NOTE — Addendum Note (Signed)
Addended by: Lurlean Nanny on: 09/05/2021 08:54 AM   Modules accepted: Orders

## 2021-09-11 ENCOUNTER — Ambulatory Visit: Payer: Medicare Other | Admitting: Internal Medicine

## 2021-09-11 ENCOUNTER — Encounter: Payer: Self-pay | Admitting: Internal Medicine

## 2021-09-11 ENCOUNTER — Other Ambulatory Visit: Payer: Self-pay

## 2021-09-11 VITALS — BP 130/80 | HR 80 | Temp 98.7°F | Ht 64.0 in | Wt 152.4 lb

## 2021-09-11 DIAGNOSIS — J0100 Acute maxillary sinusitis, unspecified: Secondary | ICD-10-CM

## 2021-09-11 DIAGNOSIS — J4540 Moderate persistent asthma, uncomplicated: Secondary | ICD-10-CM

## 2021-09-11 MED ORDER — PREDNISONE 10 MG PO TABS: 10.0000 mg | ORAL_TABLET | Freq: Every day | ORAL | 1 refills | Status: DC

## 2021-09-11 MED ORDER — AZITHROMYCIN 250 MG PO TABS: ORAL_TABLET | ORAL | 0 refills | Status: DC

## 2021-09-11 NOTE — Progress Notes (Signed)
[facility] Consultation     Date: 09/11/2021  MRN# [id number] Jane Riley [birth year]   SYNOPSIS The patient is a 70 year old female with a cough which started in the first week of June, but has been present on and off for about 3 years. At last visit was taken off ace and switched to Norvasc but stopped due to leg swelling. She was started on Arnuity sample which did not help. She has been restarted on lisinopril after a trial off of it which made no difference.   She underwent bronchoscopy on 05/27/18, this showed changes of Tracheobronchopathia osteochondroplastica, chronic bronchitis. Cultures, AFB were negative.   She has a dog, which sleeps in the bed.  She was tested for allergies, she was allergic to several things, but not the dog. She has completed allergy shots.  She does not snore at night, has never been tested for OSA, she is not sleepy during the day.     **CT chest 09/07/2018>> images personally reviewed, in comparison with previous on 03/19/2018 there remain tiny bilateral nodules, largest of which is about 6 mm, not significantly changed from previous. **CT chest 03/19/2018>> images personally reviewed, there are tiny bilateral basal nodules, largest of which is 7 mm. **Absolute eosinophil count 05/28/2017>> 100  **PFT 11/23/2016>> tracings personally reviewed, consistent with normal pulmonary functions. 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  Clinical Correlation Advised    CC   Follow-up shortness of breath   HPI Chronic shortness of breath and dyspnea exertion  Chronic cough for many years Significant secondhand smoke exposure in the past Quit tobacco 17 years ago  Works at [employer] as a Librarian, 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 and discharge Nasal congestion which drains into her chest  Has tried multiple nasal sprays in the past Currently on Xyzal antihistamine for allergic rhinitis  antireflux medicine for GERD  She does have a dog at home she denies having allergies to dogs or foods She is sensitive to bleaches and some perfumes   Based on above findings of pulmonary function testing approximately 1 year ago and the change in the PFTs most likely has some underlying reactive airways disease with bronchospasms of  small obstructive airways disease with hyperinflation and air trapping   I reassured the patient that bilateral pulmonary nodules subcentimeter in size does not cause chronic bronchitis  Recent Diagnosis of COVID 19 infection and pneumonia Seems to have nasal infection and sinus infection at this time  Medication:    Current Outpatient Medications:    Biotin 5000 MCG TABS, Take 5,000 mcg by mouth daily. , Disp: , Rfl:    carboxymethylcellulose (REFRESH TEARS) 0.5 % SOLN, Apply to eye., Disp: , Rfl:    Cholecalciferol (VITAMIN D3) 1000 units CAPS, Take 1,000 Units by mouth daily. , Disp: , Rfl:    citalopram (CELEXA) 20 MG tablet, Take 20 mg by mouth daily. , Disp: , Rfl:    Coenzyme Q10 (COQ10) 200 MG CAPS, Take 200 mg by mouth daily. , Disp: , Rfl:  diclofenac (CATAFLAM) 50 MG tablet, Take 50 mg by mouth 3 (three) times daily., Disp: , Rfl:    levocetirizine (XYZAL) 5 MG tablet, Take 1 tablet (5 mg total) by mouth every evening., Disp: 30 tablet, Rfl: 0   lipase/protease/amylase (CREON) 36000 UNITS CPEP capsule, Take 2 capsules (72,000 Units total) by mouth 3 (three) times daily with meals AND 1 capsule (36,000 Units total) 2 (two)  times daily., Disp: 240 capsule, Rfl: 1   lisinopril-hydrochlorothiazide (PRINZIDE,ZESTORETIC) 10-12.5 MG tablet, Take 1 tablet by mouth daily., Disp: , Rfl:    lovastatin (ALTOPREV) 60 MG 24 hr tablet, Take 60 mg by mouth at bedtime., Disp: , Rfl:    Magnesium Oxide 500 MG TABS, Take 500 mg by mouth daily with breakfast. , Disp: , Rfl:    mupirocin ointment (BACTROBAN) 2 %, Place 1 application into the nose 2 (two) times daily. To insides of nose as needed, Disp: 22 g, Rfl: 2   Omega-3 Fatty Acids (KP FISH OIL) 1200 MG CAPS, Take 1,200 mg by mouth daily. , Disp: , Rfl:    omeprazole (PRILOSEC) 20 MG capsule, Take 1 capsule (20 mg total) by mouth daily., Disp: 90 capsule, Rfl: 3   omeprazole (PRILOSEC) 40 MG capsule, Take 1 capsule (40 mg total) by mouth daily., Disp: 30 capsule, Rfl: 0   raloxifene (EVISTA) 60 MG tablet, Take 60 mg by mouth daily., Disp: , Rfl:    vitamin E 400 UNIT capsule, Take 400 Units by mouth daily., Disp: , Rfl:    Allergies:  Codeine, Doxycycline, Adhesive [tape], Amlodipine, Amoxicillin, Nsaids, Other, Oxycodone, and Pravastatin    Review of Systems:  Gen:  Denies  fever, sweats, chills weight loss  HEENT: Denies blurred vision, double vision, ear pain, eye pain, hearing loss, nose bleeds, sore throat Cardiac:  No dizziness, chest pain or heaviness, chest tightness,edema, No JVD Resp:   + cough, +sputum production, +shortness of breath,-wheezing, -hemoptysis,  Other:  All other systems negative  BP 130/80 (BP Location: Left Arm, Patient Position: Sitting, Cuff Size: Normal)   Pulse 80   Temp 98.7 F (37.1 C) (Oral)   Ht 5\' 4"  (1.626 m)   Wt 152 lb 6.4 oz (69.1 kg)   SpO2 96%   BMI 26.16 kg/m     Physical Examination:   General Appearance: No distress  Neuro:without focal findings,  speech normal,  HEENT: PERRLA, EOM intact.   Pulmonary: normal breath sounds, No wheezing.  CardiovascularNormal S1,S2.  No m/r/g.   Abdomen: Benign, Soft,  non-tender. 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  Symptoms most likely related to chronic bronchitis related to multiple allergies with upper airway cough syndrome with history of reflux  Incentive spirometry 10-15 times per day Flutter valve 10-15 times per day Consider albuterol inhaler therapy as needed  Sinus infection at this time Start Z-Pak Start prednisone therapy   Continue antihistamine for chronic allergic rhinitis Avoid allergens   Tiny lung nodules largest measuring 6 mm Very low risk for lung cancer Unchanged over the last several years CT of the chest reviewed in detail with the patient patient has CT scan of her abdomen in November 2022 and previous CT chest was December 2020 Findings suggest benign findings in the left lower lobe nodules pleural-based No further work-up needed   MEDICATION ADJUSTMENTS/LABS AND TESTS ORDERED:  Continue antihistamines as prescribed Continue medications for reflux Incentive spirometry 10-15 times per day(breathing in exercise) Flutter valve 10-15 times per day(breathing out exercise)  Start Z-Pak and prednisone for sinus infection No follow-up CT scan needed  CURRENT MEDICATIONS REVIEWED AT LENGTH WITH PATIENT TODAY   Patient satisfied with Plan of action and management. All questions answered  Follow-up 1 year Total time spent 32 minutes    Corrin Parker, M.D.  [facility]  Medical Director [place] Director [facility] Cardio-Pulmonary Department

## 2021-09-11 NOTE — Patient Instructions (Addendum)
Continue antihistamines as prescribed Continue medications for reflux Incentive spirometry 10-15 times per day(breathing in exercise) Flutter valve 10-15 times per day(breathing out exercise)  Start Z-Pak and prednisone for sinus infection No follow-up CT scan needed

## 2021-09-11 NOTE — Telephone Encounter (Signed)
I called pt and she is referring to lab results from 08/14/21, not current results. Pt expressed understanding and stated that she will try the omeprazole 1st and will let us now... Pt declined OV at this time

## 2021-10-01 ENCOUNTER — Ambulatory Visit: Payer: Medicare Other | Admitting: Dermatology

## 2021-10-01 ENCOUNTER — Encounter: Payer: Self-pay | Admitting: Gastroenterology

## 2021-10-01 ENCOUNTER — Other Ambulatory Visit: Payer: Self-pay

## 2021-10-01 DIAGNOSIS — L820 Inflamed seborrheic keratosis: Secondary | ICD-10-CM | POA: Diagnosis not present

## 2021-10-01 DIAGNOSIS — L814 Other melanin hyperpigmentation: Secondary | ICD-10-CM | POA: Diagnosis not present

## 2021-10-01 DIAGNOSIS — L905 Scar conditions and fibrosis of skin: Secondary | ICD-10-CM

## 2021-10-01 DIAGNOSIS — Z86018 Personal history of other benign neoplasm: Secondary | ICD-10-CM

## 2021-10-01 DIAGNOSIS — L910 Hypertrophic scar: Secondary | ICD-10-CM | POA: Diagnosis not present

## 2021-10-01 MED ORDER — OMEPRAZOLE 40 MG PO CPDR: DELAYED_RELEASE_CAPSULE | ORAL | 0 refills | Status: DC

## 2021-10-01 NOTE — Progress Notes (Signed)
° °  Follow-Up Visit   Subjective  Jane Riley is a 70 y.o. female who presents for the following: Skin Problem (Patient here today for some irritation at the left shoulder. Patient had excision at area in August to remove severe dysplastic nevus. Patient also with ISK at left wrist that was treated with LN2 and has not cleared. ).  Gets irritated and she tends to pick at it   The following portions of the chart were reviewed this encounter and updated as appropriate:       Review of Systems:  No other skin or systemic complaints except as noted in HPI or Assessment and Plan.  Objective  Well appearing patient in no apparent distress; mood and affect are within normal limits.  A focused examination was performed including left shoulder, left wrist. Relevant physical exam findings are noted in the Assessment and Plan.  Left Shoulder Firm pink linear plaque, area is sore and itchy  Left Wrist Keratotic papule 3 mm    Assessment & Plan  Scar Left Shoulder  Hypertrophic, symptomatic  IL steroid injection today  NDC 0998-3382-50  Intralesional steroid injection side effects were reviewed including thinning of the skin and discoloration, such as redness, lightening or darkening.   Intralesional injection - Left Shoulder Location: left shoulder  Informed Consent: Discussed risks (infection, pain, bleeding, bruising, thinning of the skin, loss of skin pigment, lack of resolution, and recurrence of lesion) and benefits of the procedure, as well as the alternatives. Informed consent was obtained. Preparation: The area was prepared a standard fashion.  Procedure Details: An intralesional injection was performed with Kenalog 10 mg/cc. 0.1 cc in total were injected.  Total number of injections: 5  Plan: The patient was instructed on post-op care. Recommend OTC analgesia as needed for pain.   Inflamed seborrheic keratosis Left Wrist  Vs wart, 2nd cryo tx today  Recheck on  follow up, consider biopsy if not resolved.   Destruction of lesion - Left Wrist  Destruction method: cryotherapy   Informed consent: discussed and consent obtained   Lesion destroyed using liquid nitrogen: Yes   Region frozen until ice ball extended beyond lesion: Yes   Outcome: patient tolerated procedure well with no complications   Post-procedure details: wound care instructions given   Additional details:  Prior to procedure, discussed risks of blister formation, small wound, skin dyspigmentation, or rare scar following cryotherapy. Recommend Vaseline ointment to treated areas while healing.    History of Dysplastic Nevi - No evidence of recurrence today L shoulder - Recommend regular full body skin exams - Recommend daily broad spectrum sunscreen SPF 30+ to sun-exposed areas, reapply every 2 hours as needed.  - Call if any new or changing lesions are noted between office visits   Lentigines - Scattered tan macules - Due to sun exposure - Benign-appering, observe - Recommend daily broad spectrum sunscreen SPF 30+ to sun-exposed areas, reapply every 2 hours as needed. - Call for any changes   Return for TBSE, as scheduled.  Graciella Belton, RMA, am acting as scribe for Brendolyn Patty, MD .  Documentation: I have reviewed the above documentation for accuracy and completeness, and I agree with the above.  Brendolyn Patty MD

## 2021-10-01 NOTE — Patient Instructions (Addendum)
Cryotherapy Aftercare  Wash gently with soap and water everyday.   Apply Vaseline and Band-Aid daily until healed.   Intralesional steroid injection side effects were reviewed including thinning of the skin and discoloration, such as redness, lightening or darkening.  Recommend Serica moisturizing scar formula cream every night or [facility] brand or Mederma silicone scar sheet every night for the first year after a scar appears to help with scar remodeling if desired. Scars remodel on their own for a full year.   If You Need Anything After Your Visit  If you have any questions or concerns for your doctor, please call our main line at [phone] and press option 4 to reach your doctor's medical assistant. If no one answers, please leave a voicemail as directed and we will return your call as soon as possible. Messages left after 4 pm will be answered the following business day.   You may also send [place] a message via [place]. We typically respond to MyChart messages within 1-2 business days.  For prescription refills, please ask your pharmacy to contact our office. Our fax number is [phone].  If you have an urgent issue when the clinic is closed that cannot wait until the next business day, you can page your doctor at the number below.    Please note that while we do our best to be available for urgent issues outside of office hours, we are not available 24/7.   If you have an urgent issue and are unable to reach [place], you may choose to seek medical care at your doctor's office, retail clinic, urgent care center, or emergency room.  If you have a medical emergency, please immediately call 911 or go to the emergency department.  Pager Numbers  - Dr. Nehemiah Massed: [phone]  - Dr. Laurence Ferrari: [phone]  - Dr. Nicole Kindred: [phone]  In the event of inclement weather, please call our main line at [phone] for an update on the status of any delays or closures.  Dermatology Medication  Tips: Please keep the boxes that topical medications come in in order to help keep track of the instructions about where and how to use these. Pharmacies typically print the medication instructions only on the boxes and not directly on the medication tubes.   If your medication is too expensive, please contact our office at [phone] option 4 or send [place] a message through [place].   We are unable to tell what your co-pay for medications will be in advance as this is different depending on your insurance coverage. However, we may be able to find a substitute medication at lower cost or fill out paperwork to get insurance to cover a needed medication.   If a prior authorization is required to get your medication covered by your insurance company, please allow [place] 1-2 business days to complete this process.  Drug prices often vary depending on where the prescription is filled and some pharmacies may offer cheaper prices.  The website www.goodrx.com contains coupons for medications through different pharmacies. The prices here do not account for what the cost may be with help from insurance (it may be cheaper with your insurance), but the website can give you the price if you did not use any insurance.  - You can print the associated coupon and take it with your prescription to the pharmacy.  - You may also stop by our office during regular business hours and pick up a GoodRx coupon card.  - If you need your prescription sent electronically  to a different pharmacy, notify our office through [facility] or by phone at [phone] option 4.     Si Usted Necesita Algo Despus de Su Visita  Tambin puede enviarnos un mensaje a travs de Pharmacist, community. Por lo general respondemos a los mensajes de MyChart en el transcurso de 1 a 2 das hbiles.  Para renovar recetas, por favor pida a su farmacia que se ponga en contacto con nuestra oficina. Harland Dingwall de fax es [place] [phone].  Si tiene un  asunto urgente cuando la clnica est cerrada y que no puede esperar hasta el siguiente da hbil, puede llamar/localizar a su doctor(a) al nmero que aparece a continuacin.   Por favor, tenga en cuenta que aunque hacemos todo lo posible para estar disponibles para asuntos urgentes fuera del horario de [place], no estamos disponibles las 24 horas del da, los 7 das de la [place].   Si tiene un problema urgente y no puede comunicarse con nosotros, puede optar por buscar atencin mdica  en el consultorio de su doctor(a), en una clnica privada, en un centro de atencin urgente o en una sala de emergencias.  Si tiene Engineering geologist, por favor llame inmediatamente al 911 o vaya a la sala de emergencias.  Nmeros de bper  - Dr. Nehemiah Massed: [phone]  - Dra. Moye: [phone]  - Dra. Nicole Kindred: [phone]  En caso de inclemencias del [place], por favor llame a Johnsie Kindred principal al [phone] para una actualizacin sobre el [place] de cualquier retraso o cierre.  Consejos para la medicacin en dermatologa: Por favor, guarde las cajas en las que vienen los medicamentos de uso tpico para ayudarle a seguir las instrucciones sobre dnde y cmo usarlos. Las farmacias generalmente imprimen las instrucciones del medicamento slo en las cajas y no directamente en los tubos del [place].   Si su medicamento es muy caro, por favor, pngase en contacto con Zigmund Daniel llamando al [phone] y presione la opcin 4 o envenos un mensaje a travs de Pharmacist, community.   No podemos decirle cul ser su copago por los medicamentos por adelantado ya que esto es diferente dependiendo de la cobertura de su seguro. Sin embargo, es posible que podamos encontrar un medicamento sustituto a Electrical engineer un formulario para que el seguro cubra el medicamento que se considera necesario.   Si se requiere una autorizacin previa para que su compaa de seguros [place] su medicamento, por favor  permtanos de 1 a 2 das hbiles para completar este proceso.  Los precios de los medicamentos varan con frecuencia dependiendo del Environmental consultant de dnde se surte la receta y alguna farmacias pueden ofrecer precios ms baratos.  El sitio web www.goodrx.com tiene cupones para medicamentos de Airline pilot. Los precios aqu no tienen en cuenta lo que podra costar con la ayuda del seguro (puede ser ms barato con su seguro), pero el sitio web puede darle el precio si no utiliz Research scientist (physical sciences).  - Puede imprimir el cupn correspondiente y llevarlo con su receta a la farmacia.  - Tambin puede pasar por nuestra oficina durante el horario de atencin regular y Charity fundraiser una tarjeta de cupones de GoodRx.  - Si necesita que su receta se enve electrnicamente a una farmacia diferente, informe a nuestra oficina a travs de MyChart de [place] o por telfono llamando al [phone] y presione la opcin 4.

## 2021-12-03 ENCOUNTER — Ambulatory Visit: Payer: Medicare Other | Admitting: Dermatology

## 2021-12-03 ENCOUNTER — Other Ambulatory Visit: Payer: Self-pay

## 2021-12-03 DIAGNOSIS — B079 Viral wart, unspecified: Secondary | ICD-10-CM

## 2021-12-03 DIAGNOSIS — L821 Other seborrheic keratosis: Secondary | ICD-10-CM | POA: Diagnosis not present

## 2021-12-03 DIAGNOSIS — L910 Hypertrophic scar: Secondary | ICD-10-CM | POA: Diagnosis not present

## 2021-12-03 DIAGNOSIS — D485 Neoplasm of uncertain behavior of skin: Secondary | ICD-10-CM

## 2021-12-03 DIAGNOSIS — L905 Scar conditions and fibrosis of skin: Secondary | ICD-10-CM

## 2021-12-03 NOTE — Patient Instructions (Addendum)

## 2021-12-03 NOTE — Progress Notes (Signed)
° °  Follow-Up Visit   Subjective  Jane Riley is a 71 y.o. female who presents for the following: Follow-up.  Patient here for 2 month follow-up hypertrophic scar of the left shoulder, much improved with decreased itch. Inflamed SK vs wart of the left wrist is still there after cryotherapy treatments x 2. She also has a new area she noticed on her right upper arm, present for a few weeks.   The following portions of the chart were reviewed this encounter and updated as appropriate:       Review of Systems:  No other skin or systemic complaints except as noted in HPI or Assessment and Plan.  Objective  Well appearing patient in no apparent distress; mood and affect are within normal limits.  A focused examination was performed including left shoulder, right upper arm, left wrist. Relevant physical exam findings are noted in the Assessment and Plan.  L upper wrist/extensor forearm 3.0 mm keratotic papule       Left Shoulder Light pink scar with slight induration    Assessment & Plan   Seborrheic Keratoses - Waxy tan macule with adjacent purpura of the right upper arm - Benign-appearing - Discussed benign etiology and prognosis. - Observe - Call for any changes   Neoplasm of uncertain behavior of skin L upper wrist/extensor forearm  Epidermal / dermal shaving  Lesion diameter (cm):  0.4 Informed consent: discussed and consent obtained   Patient was prepped and draped in usual sterile fashion: Area prepped with alcohol. Anesthesia: the lesion was anesthetized in a standard fashion   Anesthetic:  1% lidocaine w/ epinephrine 1-100,000 buffered w/ 8.4% NaHCO3 Instrument used: flexible razor blade   Hemostasis achieved with: pressure, aluminum chloride and electrodesiccation   Outcome: patient tolerated procedure well   Post-procedure details: wound care instructions given   Post-procedure details comment:  Ointment and small bandage applied  Specimen 1 - Surgical  pathology Differential Diagnosis: Wart vs Inflamed SK r/o SCC Check Margins: No 3.0 mm keratotic papule  Cryotherapy x 2 in past, no change Shave removal today, Wart r/o SCC  Scar Left Shoulder  Hypertrophic at site of previous dysplastic nevus shave removal.   Much improved with IL steroid injection. No longer symptomatic. Do not recommend further treatment at this time. Observation.    Return as scheduled for TBSE.  IJamesetta Orleans, CMA, am acting as scribe for Brendolyn Patty, MD .  Documentation: I have reviewed the above documentation for accuracy and completeness, and I agree with the above.  Brendolyn Patty MD

## 2021-12-09 ENCOUNTER — Telehealth: Payer: Self-pay

## 2021-12-09 ENCOUNTER — Other Ambulatory Visit: Payer: Self-pay

## 2021-12-09 ENCOUNTER — Ambulatory Visit (INDEPENDENT_AMBULATORY_CARE_PROVIDER_SITE_OTHER): Payer: Medicare Other | Admitting: Gastroenterology

## 2021-12-09 ENCOUNTER — Encounter: Payer: Self-pay | Admitting: Gastroenterology

## 2021-12-09 VITALS — BP 148/88 | HR 69 | Temp 98.7°F | Ht 64.0 in | Wt 151.4 lb

## 2021-12-09 DIAGNOSIS — K529 Noninfective gastroenteritis and colitis, unspecified: Secondary | ICD-10-CM | POA: Diagnosis not present

## 2021-12-09 DIAGNOSIS — K8689 Other specified diseases of pancreas: Secondary | ICD-10-CM | POA: Diagnosis not present

## 2021-12-09 NOTE — Telephone Encounter (Signed)
-----   Message from Brendolyn Patty, MD sent at 12/09/2021  9:41 AM EST ----- ?Skin , left upper wrist/extensor forearm ?VERRUCA VULGARIS, IRRITATED ? ?Benign wart - please call patient ?

## 2021-12-09 NOTE — Telephone Encounter (Signed)
Advised patient biopsy of the left upper wrist was a benign wart. No further treatment.  ?

## 2021-12-09 NOTE — Progress Notes (Signed)
Cephas Darby, MD [address]  [place]  [place], [place] [address]  Main: [phone]  Fax: [phone]    Gastroenterology Consultation  Referring Provider:     Dion Body, MD Primary Care Physician:  Dion Body, MD Primary Gastroenterologist:  Dr. Bonna Gains Reason for Consultation:   EPI, loose stools, gas        HPI:   Jane Riley is a 71 y.o. female referred by Dr. Dion Body, MD  for consultation & management of exocrine pancreatic insufficiency.  Patient had history of chronic diarrhea and gas, underwent work-up including upper endoscopy and colonoscopy which were unremarkable.  Celiac disease has not been ruled out.  Her pancreatic fecal elastase levels were 90.  Therefore patient is started on Creon 36 K 2 capsules with each meal and 1 with snack.  Patient reports that her diarrhea has significantly improved.  She does have intermittent loose stools sometimes foul-smelling, experiences significant amount of gas.  She denies any abdominal bloating.  She also underwent CT abdomen pelvis with contrast which revealed normal pancreas.  NSAIDs: None  Antiplts/Anticoagulants/Anti thrombotics: None  GI Procedures:  EGD and colonoscopy 05/10/2020 - White nummular lesions in esophageal mucosa. Brushings performed. - Erythematous mucosa in the antrum. Biopsied. - Normal duodenal bulb, second portion of the duodenum and examined duodenum. - Biopsies were obtained in the gastric body, at the incisura and in the gastric antrum.  - Diverticulosis in the sigmoid colon. - Medium-sized lipoma in the cecum. Biopsied. - Tortuous colon. - The examination was otherwise normal. - The rectum, sigmoid colon, descending colon, transverse colon, ascending colon and cecum are normal. Biopsied. - Non-bleeding internal hemorrhoids.  DIAGNOSIS:  A. STOMACH ERYTHEMA; COLD BIOPSY:  - OXYNTIC MUCOSA WITH CHANGES CONSISTENT WITH PROTON PUMP INHIBITOR USE.  -  NEGATIVE FOR H. PYLORI, DYSPLASIA, AND MALIGNANCY.   B.  STOMACH POLYP; COLD BIOPSY:  - FUNDIC GLAND POLYP.  - NEGATIVE FOR H. PYLORI, DYSPLASIA, AND MALIGNANCY.   C.  COLON, RANDOM; COLD BIOPSY:  - UNREMARKABLE COLONIC MUCOSA.  - NEGATIVE FOR MICROSCOPIC COLITIS, DYSPLASIA, AND MALIGNANCY.   D.  COLON LIPOMA, CECUM; COLD BIOPSY:  - COLONIC MUCOSA WITH SUBMUCOSAL ADIPOSE TISSUE, CONSISTENT WITH  ENDOSCOPIC IMPRESSION OF LIPOMA.  - NEGATIVE FOR DYSPLASIA AND MALIGNANCY.   Past Medical History:  Diagnosis Date   Arthritis    neck, hands   CRPS (complex regional pain syndrome)    Depression    Dysplastic nevus 12/07/2007   left post shoulder. Severe atypia. Excised: 01/17/2008, no residual neoplasm   Dysplastic nevus 12/22/2017   left buttock. Moderate atypia, limited margins free   Dysplastic nevus 05/06/2021   left shoulder. Moderate to severe. Shave removal  06/03/21   Family history of adverse reaction to anesthesia    mother nausea and vomiting   GERD (gastroesophageal reflux disease)    Hypercholesteremia    Hypertension    Osteoporosis    PONV (postoperative nausea and vomiting)     Past Surgical History:  Procedure Laterality Date   ABDOMINAL HYSTERECTOMY     CATARACT EXTRACTION W/PHACO Right 01/12/2017   Procedure: CATARACT EXTRACTION PHACO AND INTRAOCULAR LENS PLACEMENT ([place]) Right Restor Lens;  Surgeon: Ronnell Freshwater, MD;  Location: [facility];  Service: Ophthalmology;  Laterality: Right;  Restor Lens   CESAREAN SECTION     x2   CHOLECYSTECTOMY     COLONOSCOPY WITH PROPOFOL N/A 05/10/2020   Procedure: COLONOSCOPY WITH PROPOFOL;  Surgeon: Vonda Antigua  B, MD;  Location: [facility];  Service: Endoscopy;  Laterality: N/A;   ESOPHAGOGASTRODUODENOSCOPY (EGD) WITH PROPOFOL N/A 03/24/2018   Procedure: ESOPHAGOGASTRODUODENOSCOPY (EGD) WITH PROPOFOL;  Surgeon: Virgel Manifold, MD;  Location: [facility];  Service: Endoscopy;  Laterality:  N/A;   ESOPHAGOGASTRODUODENOSCOPY (EGD) WITH PROPOFOL N/A 05/10/2020   Procedure: ESOPHAGOGASTRODUODENOSCOPY (EGD) WITH PROPOFOL;  Surgeon: Virgel Manifold, MD;  Location: [facility];  Service: Endoscopy;  Laterality: N/A;   FLEXIBLE BRONCHOSCOPY N/A 05/27/2018   Procedure: FLEXIBLE BRONCHOSCOPY;  Surgeon: [name], MD;  Location: [facility];  Service: Pulmonary;  Laterality: N/A;   NECK SURGERY  2008   4 level fusion.  plates and screws   TONSILLECTOMY     WRIST SURGERY Left 2014   implant but unsure of the type    Prior to Admission medications   Medication Sig Start Date End Date Taking? Authorizing Provider  Biotin 5000 MCG TABS Take 5,000 mcg by mouth daily.    Yes [provider]  carboxymethylcellulose (REFRESH TEARS) 0.5 % SOLN Apply to eye.   Yes [provider]  Cholecalciferol (VITAMIN D3) 1000 units CAPS Take 1,000 Units by mouth daily.    Yes [provider]  citalopram (CELEXA) 20 MG tablet Take 20 mg by mouth daily.    Yes [provider]  Coenzyme Q10 (COQ10) 200 MG CAPS Take 200 mg by mouth daily.    Yes [provider]  diclofenac (CATAFLAM) 50 MG tablet Take 50 mg by mouth 3 (three) times daily.   Yes [provider]  levocetirizine (XYZAL) 5 MG tablet Take 1 tablet (5 mg total) by mouth every evening. 10/07/17  Yes Ratcliffe, Heather R, PA-C  lipase/protease/amylase (CREON) 36000 UNITS CPEP capsule Take 2 capsules (72,000 Units total) by mouth 3 (three) times daily with meals AND 1 capsule (36,000 Units total) 2 (two) times daily. 09/05/21  Yes Virgel Manifold, MD  lisinopril-hydrochlorothiazide (PRINZIDE,ZESTORETIC) 10-12.5 MG tablet Take 1 tablet by mouth daily.   Yes [provider]  lovastatin (MEVACOR) 40 MG tablet Take by mouth. 10/04/21  Yes [provider]  Magnesium Oxide 500 MG TABS Take 500 mg by mouth daily with breakfast.    Yes [provider]  Omega-3 Fatty  Acids (KP FISH OIL) 1200 MG CAPS Take 1,200 mg by mouth daily.    Yes [provider]  omeprazole (PRILOSEC) 40 MG capsule Take 1 capsule (40 mg total) by mouth 2 (two) times daily for 15 days, THEN 1 capsule (40 mg total) daily. 10/01/21 01/14/22 Yes Virgel Manifold, MD  raloxifene (EVISTA) 60 MG tablet Take 60 mg by mouth daily.   Yes [provider]  vitamin E 400 UNIT capsule Take 400 Units by mouth daily.   Yes [provider]    Family History  Problem Relation Age of Onset   Breast cancer Cousin    Breast cancer Cousin      Social History   Tobacco Use   Smoking status: Former    Packs/day: 0.25    Years: 10.00    Pack years: 2.50    Types: Cigarettes    Quit date: 10/06/2006    Years since quitting: 15.1   Smokeless tobacco: Never  Vaping Use   Vaping Use: Never used  Substance Use Topics   Alcohol use: Yes    Alcohol/week: 7.0 standard drinks    Types: 7 Glasses of wine per week   Drug use: Never    Allergies as of  12/09/2021 - Review Complete 12/09/2021  Allergen Reaction Noted   Codeine Nausea And Vomiting and Nausea Only 02/20/2014   Doxycycline  03/10/2018   Adhesive [tape] Other (See Comments) 01/05/2017   Amlodipine Swelling 05/21/2018   Amoxicillin Rash 11/29/2014   Nsaids Other (See Comments) 02/20/2014   Other Itching 02/20/2014   Oxycodone Itching 05/21/2018   Pravastatin Other (See Comments) 02/20/2014    Review of Systems:    All systems reviewed and negative except where noted in HPI.   Physical Exam:  BP (!) 148/88 (BP Location: Left Arm, Patient Position: Sitting, Cuff Size: Normal)    Pulse 69    Temp 98.7 F (37.1 C) (Oral)    Ht _0  (1.626 m)    Wt 151 lb 6 oz (68.7 kg)    BMI 25.98 kg/m  No LMP recorded. Patient has had a hysterectomy.  General:   Alert,  Well-developed, well-nourished, pleasant and cooperative in NAD Head:  Normocephalic and atraumatic. Eyes:  Sclera clear, no icterus.   Conjunctiva  pink. Ears:  Normal auditory acuity. Nose:  No deformity, discharge, or lesions. Mouth:  No deformity or lesions,oropharynx pink & moist. Neck:  Supple; no masses or thyromegaly. Lungs:  Respirations even and unlabored.  Clear throughout to auscultation.   No wheezes, crackles, or rhonchi. No acute distress. Heart:  Regular rate and rhythm; no murmurs, clicks, rubs, or gallops. Abdomen:  Normal bowel sounds. Soft, non-tender and non-distended without masses, hepatosplenomegaly or hernias noted.  No guarding or rebound tenderness.   Rectal: Not performed Msk:  Symmetrical without gross deformities. Good, equal movement & strength bilaterally. Pulses:  Normal pulses noted. Extremities:  No clubbing or edema.  No cyanosis. Neurologic:  Alert and oriented x3;  grossly normal neurologically. Skin:  Intact without significant lesions or rashes. No jaundice. Lymph Nodes:  No significant cervical adenopathy. Psych:  Alert and cooperative. Normal mood and affect.  Imaging Studies: Reviewed  Assessment and Plan:   BRELEIGH CARPINO is a 71 y.o. pleasant Caucasian female with history of chronic diarrhea, gas, diagnosed with severe EPI, pancreatic fecal elastase levels 90, currently on Creon.  Patient's majority of the symptoms have resolved.  She continues to have intermittent loose stools sometimes associated with foul smell and significant amount of gas.  EGD and colonoscopy were unremarkable.  CT abdomen and pelvis revealed normal-appearing pancreas.  Patient was not a heavy smoker in her life.  She does not have history of metabolic syndrome or obesity.  Unclear etiology of HPI  Severe exocrine pancreatic insufficiency: Still have mild symptoms Continue Creon 36 K with first bite of each meal and 1 with snack Check celiac disease panel Recommend GI profile PCR to rule out any infection Repeat pancreatic fecal elastase levels If above work-up is negative, recommend empiric trial of 2 weeks course of  rifaximin for SIBO  Follow up in 4 months   Cephas Darby, MD

## 2021-12-11 LAB — CELIAC DISEASE PANEL
Endomysial IgA: NEGATIVE
IgA/Immunoglobulin A, Serum: 99 mg/dL (ref 87–352)
Transglutaminase IgA: <2 U/mL (ref 0–3)

## 2022-01-07 ENCOUNTER — Ambulatory Visit
Admission: RE | Admit: 2022-01-07 | Discharge: 2022-01-07 | Disposition: A | Discharge status: Home / Self Care | Payer: Medicare Other | Source: Ambulatory Visit | Attending: Emergency Medicine | Admitting: Emergency Medicine

## 2022-01-07 VITALS — BP 128/86 | HR 87 | Temp 98.6°F | Resp 18

## 2022-01-07 DIAGNOSIS — J029 Acute pharyngitis, unspecified: Secondary | ICD-10-CM

## 2022-01-07 DIAGNOSIS — R051 Acute cough: Secondary | ICD-10-CM

## 2022-01-07 DIAGNOSIS — J302 Other seasonal allergic rhinitis: Secondary | ICD-10-CM | POA: Diagnosis not present

## 2022-01-07 LAB — POCT RAPID STREP A (OFFICE): Rapid Strep A Screen: NEGATIVE

## 2022-01-07 MED ORDER — BENZONATATE 100 MG PO CAPS: 100.0000 mg | ORAL_CAPSULE | Freq: Three times a day (TID) | ORAL | 0 refills | Status: DC | PRN

## 2022-01-07 MED ORDER — LIDOCAINE VISCOUS HCL 2 % MT SOLN: 15.0000 mL | OROMUCOSAL | 0 refills | Status: DC | PRN

## 2022-01-07 NOTE — ED Provider Notes (Signed)
?[facility] ? ? ? ?CSN: [id number] ?Arrival date & time: 01/07/22  1343 ? ? ?  ? ?History   ?Chief Complaint ?Chief Complaint  ?Patient presents with  ? Sore Throat  ? ? ?HPI ?Jane Riley is a 71 y.o. female.  Patient presents with 3-day history of postnasal drip, runny nose, congestion, sore throat, cough.  Treatment at home with Xyzal.  She denies fever, chills, rash, shortness of breath, vomiting, diarrhea, or other symptoms.  Her medical history includes allergic rhinitis, anxiety, complex regional pain syndrome, hypertension, GERD.  ? ?The history is provided by the patient and medical records.  ? ?Past Medical History:  ?Diagnosis Date  ? Arthritis   ? neck, hands  ? CRPS (complex regional pain syndrome)   ? Depression   ? Dysplastic nevus 12/07/2007  ? left post shoulder. Severe atypia. Excised: 01/17/2008, no residual neoplasm  ? Dysplastic nevus 12/22/2017  ? left buttock. Moderate atypia, limited margins free  ? Dysplastic nevus 05/06/2021  ? left shoulder. Moderate to severe. Shave removal  06/03/21  ? Family history of adverse reaction to anesthesia   ? mother nausea and vomiting  ? GERD (gastroesophageal reflux disease)   ? Hypercholesteremia   ? Hypertension   ? Osteoporosis   ? PONV (postoperative nausea and vomiting)   ? ? ?Patient Active Problem List  ? Diagnosis Date Noted  ? Cervical radiculopathy 05/24/2020  ? Osteopenia of neck of left femur 06/22/2018  ? Esophageal dysphagia   ? Feline esophagus   ? Nuclear sclerotic cataract of left eye 06/25/2017  ? Presbyopia 06/25/2017  ? Shingles 05/28/2017  ? Anxiety 05/20/2017  ? Essential hypertension 05/20/2017  ? Gastroesophageal reflux disease without esophagitis 05/20/2017  ? Mixed hyperlipidemia 05/20/2017  ? Allergic rhinitis 12/15/2016  ? Vaccine counseling 01/07/2016  ? ? ?Past Surgical History:  ?Procedure Laterality Date  ? ABDOMINAL HYSTERECTOMY    ? CATARACT EXTRACTION W/PHACO Right 01/12/2017  ? Procedure: CATARACT EXTRACTION PHACO AND  INTRAOCULAR LENS PLACEMENT ([place]) Right Restor Lens;  Surgeon: Ronnell Freshwater, MD;  Location: [facility];  Service: Ophthalmology;  Laterality: Right;  Restor Lens  ? CESAREAN SECTION    ? x2  ? CHOLECYSTECTOMY    ? COLONOSCOPY WITH PROPOFOL N/A 05/10/2020  ? Procedure: COLONOSCOPY WITH PROPOFOL;  Surgeon: Virgel Manifold, MD;  Location: [facility];  Service: Endoscopy;  Laterality: N/A;  ? ESOPHAGOGASTRODUODENOSCOPY (EGD) WITH PROPOFOL N/A 03/24/2018  ? Procedure: ESOPHAGOGASTRODUODENOSCOPY (EGD) WITH PROPOFOL;  Surgeon: Virgel Manifold, MD;  Location: [facility];  Service: Endoscopy;  Laterality: N/A;  ? ESOPHAGOGASTRODUODENOSCOPY (EGD) WITH PROPOFOL N/A 05/10/2020  ? Procedure: ESOPHAGOGASTRODUODENOSCOPY (EGD) WITH PROPOFOL;  Surgeon: Virgel Manifold, MD;  Location: [facility];  Service: Endoscopy;  Laterality: N/A;  ? FLEXIBLE BRONCHOSCOPY N/A 05/27/2018  ? Procedure: FLEXIBLE BRONCHOSCOPY;  Surgeon: [name], MD;  Location: [facility];  Service: Pulmonary;  Laterality: N/A;  ? NECK SURGERY  2008  ? 4 level fusion.  plates and screws  ? TONSILLECTOMY    ? WRIST SURGERY Left 2014  ? implant but unsure of the type  ? ? ?OB History   ?No obstetric history on file. ?  ? ? ? ?Home Medications   ? ?Prior to Admission medications   ?Medication Sig Start Date End Date Taking? Authorizing Provider  ?benzonatate (TESSALON) 100 MG capsule Take 1 capsule (100 mg total) by mouth 3 (three) times daily as needed for cough. 01/07/22  Yes Sharion Balloon, NP  ?  lidocaine (XYLOCAINE) 2 % solution Use as directed 15 mLs in the mouth or throat as needed for mouth pain. 01/07/22  Yes Sharion Balloon, NP  ?Biotin 5000 MCG TABS Take 5,000 mcg by mouth daily.     [provider]  ?carboxymethylcellulose (REFRESH TEARS) 0.5 % SOLN Apply to eye.    [provider]  ?Cholecalciferol (VITAMIN D3) 1000 units CAPS Take 1,000 Units by mouth daily.     [provider]   ?citalopram (CELEXA) 20 MG tablet Take 20 mg by mouth daily.     [provider]  ?Coenzyme Q10 (COQ10) 200 MG CAPS Take 200 mg by mouth daily.     [provider]  ?diclofenac (CATAFLAM) 50 MG tablet Take 50 mg by mouth 3 (three) times daily.    [provider]  ?levocetirizine (XYZAL) 5 MG tablet Take 1 tablet (5 mg total) by mouth every evening. 10/07/17   Ratcliffe, Heather R, PA-C  ?lipase/protease/amylase (CREON) 36000 UNITS CPEP capsule Take 2 capsules (72,000 Units total) by mouth 3 (three) times daily with meals AND 1 capsule (36,000 Units total) 2 (two) times daily. 09/05/21   Virgel Manifold, MD  ?lisinopril-hydrochlorothiazide (PRINZIDE,ZESTORETIC) 10-12.5 MG tablet Take 1 tablet by mouth daily.    [provider]  ?lovastatin (MEVACOR) 40 MG tablet Take by mouth. 10/04/21   [provider]  ?Magnesium Oxide 500 MG TABS Take 500 mg by mouth daily with breakfast.     [provider]  ?Omega-3 Fatty Acids (KP FISH OIL) 1200 MG CAPS Take 1,200 mg by mouth daily.     [provider]  ?omeprazole (PRILOSEC) 40 MG capsule Take 1 capsule (40 mg total) by mouth 2 (two) times daily for 15 days, THEN 1 capsule (40 mg total) daily. 10/01/21 01/14/22  Virgel Manifold, MD  ?raloxifene (EVISTA) 60 MG tablet Take 60 mg by mouth daily.    [provider]  ?vitamin E 400 UNIT capsule Take 400 Units by mouth daily.    [provider]  ? ? ?Family History ?Family History  ?Problem Relation Age of Onset  ? Breast cancer Cousin   ? Breast cancer Cousin   ? ? ?Social History ?Social History  ? ?Tobacco Use  ? Smoking status: Former  ?  Packs/day: 0.25  ?  Years: 10.00  ?  Pack years: 2.50  ?  Types: Cigarettes  ?  Quit date: 10/06/2006  ?  Years since quitting: 15.2  ? Smokeless tobacco: Never  ?Vaping Use  ? Vaping Use: Never used  ?Substance Use Topics  ? Alcohol use: Yes  ?  Alcohol/week: 7.0 standard drinks  ?  Types: 7 Glasses of wine  per week  ? Drug use: Never  ? ? ? ?Allergies   ?Codeine, Doxycycline, Adhesive [tape], Amlodipine, Amoxicillin, Nsaids, Other, Oxycodone, and Pravastatin ? ? ?Review of Systems ?Review of Systems  ?Constitutional:  Negative for chills and fever.  ?HENT:  Positive for congestion, postnasal drip, rhinorrhea and sore throat. Negative for ear pain.   ?Respiratory:  Positive for cough. Negative for shortness of breath.   ?Cardiovascular:  Negative for chest pain and palpitations.  ?Gastrointestinal:  Negative for diarrhea and vomiting.  ?Skin:  Negative for color change and rash.  ?All other systems reviewed and are negative. ? ? ?Physical Exam ?Triage Vital Signs ?ED Triage Vitals  ?Enc Vitals Group  ?   BP   ?   Pulse   ?   Resp   ?  Temp   ?   Temp src   ?   SpO2   ?   Weight   ?   Height   ?   Head Circumference   ?   Peak Flow   ?   Pain Score   ?   Pain Loc   ?   Pain Edu?   ?   Excl. in [place]?   ? ?No data found. ? ?Updated Vital Signs ?BP 128/86   Pulse 87   Temp 98.6 ?F (37 ?C)   Resp 18   SpO2 94%  ? ?Visual Acuity ?Right Eye Distance:   ?Left Eye Distance:   ?Bilateral Distance:   ? ?Right Eye Near:   ?Left Eye Near:    ?Bilateral Near:    ? ?Physical Exam ?Vitals and nursing note reviewed.  ?Constitutional:   ?   General: She is not in acute distress. ?   Appearance: She is well-developed. She is not ill-appearing.  ?HENT:  ?   Right Ear: Tympanic membrane normal.  ?   Left Ear: Tympanic membrane normal.  ?   Nose: Nose normal.  ?   Mouth/Throat:  ?   Mouth: Mucous membranes are moist.  ?   Pharynx: Posterior oropharyngeal erythema present.  ?   Comments: Clear postnasal drip. ?Cardiovascular:  ?   Rate and Rhythm: Normal rate and regular rhythm.  ?   Heart sounds: Normal heart sounds.  ?Pulmonary:  ?   Effort: Pulmonary effort is normal. No respiratory distress.  ?   Breath sounds: Normal breath sounds.  ?Musculoskeletal:  ?   Cervical back: Neck supple.  ?Skin: ?   General: Skin is warm and dry.   ?Neurological:  ?   Mental Status: She is alert.  ?Psychiatric:     ?   Mood and Affect: Mood normal.     ?   Behavior: Behavior normal.  ? ? ? ?UC Treatments / Results  ?Labs ?(all labs ordered are listed, but only abn

## 2022-01-07 NOTE — Discharge Instructions (Addendum)
Your strep test is negative.  Use the viscous lidocaine as directed.  Take the [facility] as directed for cough.  Follow up with your primary care provider if your symptoms are not improving.   ? ?

## 2022-01-07 NOTE — ED Triage Notes (Signed)
Pt here with sore throat and cough x 3 days. ?

## 2022-01-13 ENCOUNTER — Other Ambulatory Visit: Payer: Self-pay | Admitting: Gastroenterology

## 2022-01-16 ENCOUNTER — Encounter: Payer: Self-pay | Admitting: Gastroenterology

## 2022-01-19 LAB — GI PROFILE, STOOL, PCR

## 2022-01-19 LAB — PANCREATIC ELASTASE, FECAL: Pancreatic Elastase, Fecal: 52 ug Elast./g — ABNORMAL LOW (ref >200)

## 2022-01-20 ENCOUNTER — Telehealth: Payer: Self-pay

## 2022-01-20 MED ORDER — RIFAXIMIN 550 MG PO TABS
550.0000 mg | ORAL_TABLET | Freq: Three times a day (TID) | ORAL | 0 refills | Status: AC
Start: 2022-01-20 — End: 2022-02-03

## 2022-01-20 NOTE — Telephone Encounter (Signed)
-----   Message from Lin Landsman, MD sent at 01/20/2022  4:03 PM EDT ----- ?Her stool studies are negative for infection.  Please check with patient if she is still having loose stools, if yes, let's send in prescription for Xifaxan 550 mg 3 times daily for 2 weeks ? ?RV ?

## 2022-01-20 NOTE — Telephone Encounter (Signed)
Patient verbalized understanding of instructions and sent medication to the pharmacy  

## 2022-01-21 ENCOUNTER — Telehealth: Payer: Self-pay

## 2022-01-21 NOTE — Telephone Encounter (Signed)
Submitted PA through cover my meds for Xifaxan waiting on response from insurance company  

## 2022-01-21 NOTE — Telephone Encounter (Signed)
Pt stated that she spoke to [facility] represenative and was told that her out of pocket cost for her anibiotic would be $700.00  and was wondering  if something cheaper could be perscribed. ?

## 2022-01-22 MED ORDER — PANCRELIPASE (LIP-PROT-AMYL) 36000-114000 UNITS PO CPEP: ORAL_CAPSULE | ORAL | 1 refills | Status: DC

## 2022-01-22 MED ORDER — METRONIDAZOLE 500 MG PO TABS: 500.0000 mg | ORAL_TABLET | Freq: Two times a day (BID) | ORAL | 0 refills | Status: AC

## 2022-01-22 NOTE — Telephone Encounter (Signed)
We can have her increase Creon to 3 capsules with each meal and 1 with snack and see if it helps.  Also, please send her prescription for Flagyl 500 mg twice daily for 2 weeks for abdominal bloating and diarrhea ? ?RV ?

## 2022-01-22 NOTE — Telephone Encounter (Signed)
Patient verbalized understanding of instructions  She states she will go pick this up  ?

## 2022-01-22 NOTE — Addendum Note (Signed)
Addended by: Ulyess Blossom L on: 01/22/2022 10:56 AM ? ? Modules accepted: Orders ? ?

## 2022-02-17 DIAGNOSIS — Z0000 Encounter for general adult medical examination without abnormal findings: Secondary | ICD-10-CM | POA: Insufficient documentation

## 2022-03-11 ENCOUNTER — Other Ambulatory Visit: Payer: Self-pay | Admitting: Family Medicine

## 2022-03-11 DIAGNOSIS — Z1231 Encounter for screening mammogram for malignant neoplasm of breast: Secondary | ICD-10-CM

## 2022-04-17 ENCOUNTER — Other Ambulatory Visit: Payer: Self-pay

## 2022-04-21 ENCOUNTER — Encounter: Payer: Self-pay | Admitting: Gastroenterology

## 2022-04-21 ENCOUNTER — Ambulatory Visit: Payer: Medicare Other | Admitting: Gastroenterology

## 2022-04-21 VITALS — BP 138/84 | HR 64 | Temp 98.3°F | Ht 64.0 in | Wt 150.5 lb

## 2022-04-21 DIAGNOSIS — K8689 Other specified diseases of pancreas: Secondary | ICD-10-CM | POA: Diagnosis not present

## 2022-04-21 DIAGNOSIS — K529 Noninfective gastroenteritis and colitis, unspecified: Secondary | ICD-10-CM

## 2022-04-21 DIAGNOSIS — K580 Irritable bowel syndrome with diarrhea: Secondary | ICD-10-CM | POA: Diagnosis not present

## 2022-04-21 DIAGNOSIS — K8681 Exocrine pancreatic insufficiency: Secondary | ICD-10-CM

## 2022-04-21 MED ORDER — ZENPEP 40000-126000 UNITS PO CPEP: ORAL_CAPSULE | ORAL | 2 refills | Status: DC

## 2022-04-21 MED ORDER — AMITRIPTYLINE HCL 25 MG PO TABS: 25.0000 mg | ORAL_TABLET | Freq: Every day | ORAL | 0 refills | Status: DC

## 2022-04-21 NOTE — Progress Notes (Unsigned)
Jane Darby, MD [address]  [place]  [place], [place] [address]  Main: [phone]  Fax: [phone]    Gastroenterology Consultation  Referring Provider:     Dion Body, MD Primary Care Physician:  Dion Body, MD Primary Gastroenterologist:  Dr. Bonna Gains Reason for Consultation:   EPI, loose stools, gas        HPI:   Jane Riley is a 71 y.o. female referred by Dr. Dion Body, MD  for consultation & management of exocrine pancreatic insufficiency.  Patient had history of chronic diarrhea and gas, underwent work-up including upper endoscopy and colonoscopy which were unremarkable.  Celiac disease has not been ruled out.  Her pancreatic fecal elastase levels were 90.  Therefore, patient is started on Creon 36 K 2 capsules with each meal and 1 with snack.  Patient reports that her diarrhea has significantly improved.  She does have intermittent loose stools sometimes foul-smelling, experiences significant amount of gas.  She denies any abdominal bloating.  She also underwent CT abdomen pelvis with contrast which revealed normal pancreas.  Follow-up visit 04/22/2022 Patient is here for follow-up of exocrine pancreatic insufficiency.  Patient has ongoing symptoms of diarrhea despite being on pancreatic enzymes.  Patient is going through significant stress in her life with her husband being started on peritoneal dialysis and it is a big adjustment for her as well.  Made significant changes in their eating habits tailored towards dialysis diet.  Patient's weight has been stable.  She continues to take Creon 36 K 2 capsules with each meal and 1 with snack.  Creon is becoming expensive for her.  NSAIDs: None  Antiplts/Anticoagulants/Anti thrombotics: None  GI Procedures:  EGD and colonoscopy 05/10/2020 - White nummular lesions in esophageal mucosa. Brushings performed. - Erythematous mucosa in the antrum. Biopsied. - Normal duodenal bulb, second portion  of the duodenum and examined duodenum. - Biopsies were obtained in the gastric body, at the incisura and in the gastric antrum.  - Diverticulosis in the sigmoid colon. - Medium-sized lipoma in the cecum. Biopsied. - Tortuous colon. - The examination was otherwise normal. - The rectum, sigmoid colon, descending colon, transverse colon, ascending colon and cecum are normal. Biopsied. - Non-bleeding internal hemorrhoids.  DIAGNOSIS:  A. STOMACH ERYTHEMA; COLD BIOPSY:  - OXYNTIC MUCOSA WITH CHANGES CONSISTENT WITH PROTON PUMP INHIBITOR USE.  - NEGATIVE FOR H. PYLORI, DYSPLASIA, AND MALIGNANCY.   B.  STOMACH POLYP; COLD BIOPSY:  - FUNDIC GLAND POLYP.  - NEGATIVE FOR H. PYLORI, DYSPLASIA, AND MALIGNANCY.   C.  COLON, RANDOM; COLD BIOPSY:  - UNREMARKABLE COLONIC MUCOSA.  - NEGATIVE FOR MICROSCOPIC COLITIS, DYSPLASIA, AND MALIGNANCY.   D.  COLON LIPOMA, CECUM; COLD BIOPSY:  - COLONIC MUCOSA WITH SUBMUCOSAL ADIPOSE TISSUE, CONSISTENT WITH  ENDOSCOPIC IMPRESSION OF LIPOMA.  - NEGATIVE FOR DYSPLASIA AND MALIGNANCY.   Past Medical History:  Diagnosis Date   Arthritis    neck, hands   CRPS (complex regional pain syndrome)    Depression    Dysplastic nevus 12/07/2007   left post shoulder. Severe atypia. Excised: 01/17/2008, no residual neoplasm   Dysplastic nevus 12/22/2017   left buttock. Moderate atypia, limited margins free   Dysplastic nevus 05/06/2021   left shoulder. Moderate to severe. Shave removal  06/03/21   Family history of adverse reaction to anesthesia    mother nausea and vomiting   GERD (gastroesophageal reflux disease)    Hypercholesteremia    Hypertension    Osteoporosis  PONV (postoperative nausea and vomiting)     Past Surgical History:  Procedure Laterality Date   ABDOMINAL HYSTERECTOMY     CATARACT EXTRACTION W/PHACO Right 01/12/2017   Procedure: CATARACT EXTRACTION PHACO AND INTRAOCULAR LENS PLACEMENT (IOC) Right Restor Lens;  Surgeon: Ronnell Freshwater, MD;  Location: [facility];  Service: Ophthalmology;  Laterality: Right;  Restor Lens   CESAREAN SECTION     x2   CHOLECYSTECTOMY     COLONOSCOPY WITH PROPOFOL N/A 05/10/2020   Procedure: COLONOSCOPY WITH PROPOFOL;  Surgeon: Virgel Manifold, MD;  Location: [facility];  Service: Endoscopy;  Laterality: N/A;   ESOPHAGOGASTRODUODENOSCOPY (EGD) WITH PROPOFOL N/A 03/24/2018   Procedure: ESOPHAGOGASTRODUODENOSCOPY (EGD) WITH PROPOFOL;  Surgeon: Virgel Manifold, MD;  Location: [facility];  Service: Endoscopy;  Laterality: N/A;   ESOPHAGOGASTRODUODENOSCOPY (EGD) WITH PROPOFOL N/A 05/10/2020   Procedure: ESOPHAGOGASTRODUODENOSCOPY (EGD) WITH PROPOFOL;  Surgeon: Virgel Manifold, MD;  Location: [facility];  Service: Endoscopy;  Laterality: N/A;   FLEXIBLE BRONCHOSCOPY N/A 05/27/2018   Procedure: FLEXIBLE BRONCHOSCOPY;  Surgeon: [name], MD;  Location: [facility];  Service: Pulmonary;  Laterality: N/A;   NECK SURGERY  2008   4 level fusion.  plates and screws   TONSILLECTOMY     WRIST SURGERY Left 2014   implant but unsure of the type     Current Outpatient Medications:    amitriptyline (ELAVIL) 25 MG tablet, Take 1 tablet (25 mg total) by mouth at bedtime., Disp: 30 tablet, Rfl: 0   Biotin 5000 MCG TABS, Take 5,000 mcg by mouth daily. , Disp: , Rfl:    busPIRone (BUSPAR) 5 MG tablet, Take 1 tablet by mouth 2 (two) times daily., Disp: , Rfl:    carboxymethylcellulose (REFRESH TEARS) 0.5 % SOLN, Apply to eye., Disp: , Rfl:    Cholecalciferol (VITAMIN D3) 1000 units CAPS, Take 1,000 Units by mouth daily. , Disp: , Rfl:    citalopram (CELEXA) 20 MG tablet, Take 20 mg by mouth daily. , Disp: , Rfl:    Coenzyme Q10 (COQ10) 200 MG CAPS, Take 200 mg by mouth daily. , Disp: , Rfl:    diclofenac (CATAFLAM) 50 MG tablet, Take 50 mg by mouth 3 (three) times daily., Disp: , Rfl:    levocetirizine (XYZAL) 5 MG tablet, Take 1 tablet (5 mg total) by mouth every  evening., Disp: 30 tablet, Rfl: 0   lovastatin (MEVACOR) 40 MG tablet, Take by mouth., Disp: , Rfl:    Magnesium Oxide 500 MG TABS, Take 500 mg by mouth daily with breakfast. , Disp: , Rfl:    Omega-3 Fatty Acids (KP FISH OIL) 1200 MG CAPS, Take 1,200 mg by mouth daily. , Disp: , Rfl:    omeprazole (PRILOSEC) 40 MG capsule, Take 1 capsule by mouth daily., Disp: , Rfl:    raloxifene (EVISTA) 60 MG tablet, Take 60 mg by mouth daily., Disp: , Rfl:    vitamin E 400 UNIT capsule, Take 400 Units by mouth daily., Disp: , Rfl:    lipase/protease/amylase (CREON) 36000 UNITS CPEP capsule, Take 3 capsule with the first bite of each meal and 1 capsule with the first bite of each snack., Disp: 990 capsule, Rfl: 3   lisinopril-hydrochlorothiazide (ZESTORETIC) 20-12.5 MG tablet, Take 1 tablet by mouth daily., Disp: , Rfl:    Family History  Problem Relation Age of Onset   Breast cancer Cousin    Breast cancer Cousin      Social History   Tobacco Use  Smoking status: Former    Packs/day: 0.25    Years: 10.00    Total pack years: 2.50    Types: Cigarettes    Quit date: 10/06/2006    Years since quitting: 15.5   Smokeless tobacco: Never  Vaping Use   Vaping Use: Never used  Substance Use Topics   Alcohol use: Yes    Alcohol/week: 7.0 standard drinks of alcohol    Types: 7 Glasses of wine per week   Drug use: Never    Allergies as of 04/21/2022 - Review Complete 04/21/2022  Allergen Reaction Noted   Codeine Nausea And Vomiting and Nausea Only 02/20/2014   Doxycycline  03/10/2018   Adhesive [tape] Other (See Comments) 01/05/2017   Amlodipine Swelling 05/21/2018   Amoxicillin Rash 11/29/2014   Nsaids Other (See Comments) 02/20/2014   Other Itching 02/20/2014   Oxycodone Itching 05/21/2018   Pravastatin Other (See Comments) 02/20/2014    Review of Systems:    All systems reviewed and negative except where noted in HPI.   Physical Exam:  BP 138/84 (BP Location: Left Arm, Patient  Position: Sitting, Cuff Size: Normal)   Pulse 64   Temp 98.3 F (36.8 C) (Oral)   Ht _0  (1.626 m)   Wt 150 lb 8 oz (68.3 kg)   BMI 25.83 kg/m  No LMP recorded. Patient has had a hysterectomy.  General:   Alert,  Well-developed, well-nourished, pleasant and cooperative in NAD Head:  Normocephalic and atraumatic. Eyes:  Sclera clear, no icterus.   Conjunctiva pink. Ears:  Normal auditory acuity. Nose:  No deformity, discharge, or lesions. Mouth:  No deformity or lesions,oropharynx pink & moist. Neck:  Supple; no masses or thyromegaly. Lungs:  Respirations even and unlabored.  Clear throughout to auscultation.   No wheezes, crackles, or rhonchi. No acute distress. Heart:  Regular rate and rhythm; no murmurs, clicks, rubs, or gallops. Abdomen:  Normal bowel sounds. Soft, non-tender and non-distended without masses, hepatosplenomegaly or hernias noted.  No guarding or rebound tenderness.   Rectal: Not performed Msk:  Symmetrical without gross deformities. Good, equal movement & strength bilaterally. Pulses:  Normal pulses noted. Extremities:  No clubbing or edema.  No cyanosis. Neurologic:  Alert and oriented x3;  grossly normal neurologically. Skin:  Intact without significant lesions or rashes. No jaundice. Lymph Nodes:  No significant cervical adenopathy. Psych:  Alert and cooperative. Normal mood and affect.  Imaging Studies: Reviewed  Assessment and Plan:   JEDA PARDUE is a 71 y.o. pleasant Caucasian female with history of chronic diarrhea, gas, diagnosed with severe EPI, pancreatic fecal elastase levels 90, currently on Creon.  Patient's majority of the symptoms have resolved.  She continues to have intermittent loose stools sometimes associated with foul smell and significant amount of gas.  EGD and colonoscopy were unremarkable.  CT abdomen and pelvis revealed normal-appearing pancreas.  Patient was not a heavy smoker in her life.  She does not have history of metabolic  syndrome or obesity.  Unclear etiology of HPI.  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  She is currently on Celexa for about 10 years and her anxiety worsened recently, therefore her PCP started on BuSpar  Severe exocrine pancreatic insufficiency: Still have some symptoms of diarrhea and some weight loss Continue Creon 36 K 2 capsules with first bite of each meal and 1 with snack Celiac disease panel negative, GI profile PCR was negative n S/p empiric trial of 2  weeks course of rifaximin for possible SIBO Recommend to check food allergy profile, alpha gal panel, check serum gastrin, somatostatin, VIP, calcitonin levels as well as 24-hour urine 5 HIAA levels Recommend low-dose amitriptyline at bedtime  Follow up in 4 months   Jane Darby, MD

## 2022-04-22 ENCOUNTER — Telehealth: Payer: Self-pay

## 2022-04-22 ENCOUNTER — Encounter: Payer: Self-pay | Admitting: Gastroenterology

## 2022-04-22 ENCOUNTER — Other Ambulatory Visit: Payer: Self-pay | Admitting: Gastroenterology

## 2022-04-22 ENCOUNTER — Ambulatory Visit
Admission: RE | Admit: 2022-04-22 | Discharge: 2022-04-22 | Disposition: A | Discharge status: Home / Self Care | Payer: Medicare Other | Source: Ambulatory Visit | Attending: Family Medicine | Admitting: Family Medicine

## 2022-04-22 DIAGNOSIS — Z1231 Encounter for screening mammogram for malignant neoplasm of breast: Secondary | ICD-10-CM | POA: Diagnosis present

## 2022-04-22 MED ORDER — PANCRELIPASE (LIP-PROT-AMYL) 36000-114000 UNITS PO CPEP: ORAL_CAPSULE | ORAL | 3 refills | Status: DC

## 2022-04-22 NOTE — Telephone Encounter (Signed)
Faxed patient assistance to [facility] patient assistance for Creon

## 2022-04-23 ENCOUNTER — Encounter: Payer: Self-pay | Admitting: Gastroenterology

## 2022-04-24 LAB — ALPHA-GAL PANEL
Allergen Lamb IgE: <0.1 kU/L
Beef IgE: <0.1 kU/L
IgE (Immunoglobulin E), Serum: 20 IU/mL (ref 6–495)
O215-IgE Alpha-Gal: <0.1 kU/L
Pork IgE: <0.1 kU/L

## 2022-04-24 LAB — FOOD ALLERGY PROFILE
Allergen Corn, IgE: <0.1 kU/L
Clam IgE: <0.1 kU/L
Codfish IgE: <0.1 kU/L
Egg White IgE: 0.16 kU/L — AB
Milk IgE: <0.1 kU/L
Peanut IgE: <0.1 kU/L
Scallop IgE: <0.1 kU/L
Sesame Seed IgE: <0.1 kU/L
Shrimp IgE: <0.1 kU/L
Soybean IgE: <0.1 kU/L
Walnut IgE: <0.1 kU/L
Wheat IgE: <0.1 kU/L

## 2022-04-24 LAB — CALCITONIN: Calcitonin: <2 pg/mL (ref 0.0–5.0)

## 2022-04-24 LAB — GASTRIN: Gastrin: 25 pg/mL (ref 0–115)

## 2022-04-28 LAB — VASOACTIVE INTESTINAL PEPTIDE (VIP): Vasoactive Intest Polypeptide: 26.5 pg/mL (ref 0.0–58.8)

## 2022-04-29 NOTE — Telephone Encounter (Signed)
Patient has been approved for patient assistance till 10/05/2022. Informed patient by [employer]

## 2022-05-06 LAB — SOMATOSTATIN: Somatostatin: 24 pg/mL (ref <=30)

## 2022-05-12 ENCOUNTER — Ambulatory Visit: Payer: Medicare Other | Admitting: Dermatology

## 2022-05-12 DIAGNOSIS — L821 Other seborrheic keratosis: Secondary | ICD-10-CM | POA: Diagnosis not present

## 2022-05-12 DIAGNOSIS — L570 Actinic keratosis: Secondary | ICD-10-CM

## 2022-05-12 DIAGNOSIS — Z86018 Personal history of other benign neoplasm: Secondary | ICD-10-CM

## 2022-05-12 DIAGNOSIS — L820 Inflamed seborrheic keratosis: Secondary | ICD-10-CM

## 2022-05-12 DIAGNOSIS — L578 Other skin changes due to chronic exposure to nonionizing radiation: Secondary | ICD-10-CM | POA: Diagnosis not present

## 2022-05-12 DIAGNOSIS — D1800 Hemangioma unspecified site: Secondary | ICD-10-CM

## 2022-05-12 DIAGNOSIS — L814 Other melanin hyperpigmentation: Secondary | ICD-10-CM

## 2022-05-12 DIAGNOSIS — Z1283 Encounter for screening for malignant neoplasm of skin: Secondary | ICD-10-CM

## 2022-05-12 DIAGNOSIS — D229 Melanocytic nevi, unspecified: Secondary | ICD-10-CM

## 2022-05-12 NOTE — Patient Instructions (Addendum)
Cryotherapy Aftercare  Wash gently with soap and water everyday.   Apply Vaseline and Band-Aid daily until healed.     Due to recent changes in healthcare laws, you may see results of your pathology and/or laboratory studies on MyChart before the doctors have had a chance to review them. We understand that in some cases there may be results that are confusing or concerning to you. Please understand that not all results are received at the same time and often the doctors may need to interpret multiple results in order to provide you with the best plan of care or course of treatment. Therefore, we ask that you please give us 2 business days to thoroughly review all your results before contacting the office for clarification. Should we see a critical lab result, you will be contacted sooner.   If You Need Anything After Your Visit  If you have any questions or concerns for your doctor, please call our main line at [phone] and press option 4 to reach your doctor's medical assistant. If no one answers, please leave a voicemail as directed and we will return your call as soon as possible. Messages left after 4 pm will be answered the following business day.   You may also send us a message via MyChart. We typically respond to MyChart messages within 1-2 business days.  For prescription refills, please ask your pharmacy to contact our office. Our fax number is [phone].  If you have an urgent issue when the clinic is closed that cannot wait until the next business day, you can page your doctor at the number below.    Please note that while we do our best to be available for urgent issues outside of office hours, we are not available 24/7.   If you have an urgent issue and are unable to reach us, you may choose to seek medical care at your doctor's office, retail clinic, urgent care center, or emergency room.  If you have a medical emergency, please immediately call 911 or go to the  emergency department.  Pager Numbers  - Dr. Kowalski: [phone]  - Dr. Moye: [phone]  - Dr. Stewart: [phone]  In the event of inclement weather, please call our main line at [phone] for an update on the status of any delays or closures.  Dermatology Medication Tips: Please keep the boxes that topical medications come in in order to help keep track of the instructions about where and how to use these. Pharmacies typically print the medication instructions only on the boxes and not directly on the medication tubes.   If your medication is too expensive, please contact our office at [phone] option 4 or send us a message through MyChart.   We are unable to tell what your co-pay for medications will be in advance as this is different depending on your insurance coverage. However, we may be able to find a substitute medication at lower cost or fill out paperwork to get insurance to cover a needed medication.   If a prior authorization is required to get your medication covered by your insurance company, please allow us 1-2 business days to complete this process.  Drug prices often vary depending on where the prescription is filled and some pharmacies may offer cheaper prices.  The website www.goodrx.com contains coupons for medications through different pharmacies. The prices here do not account for what the cost may be with help from insurance (it may be cheaper with your insurance), but the website can   give you the price if you did not use any insurance.  - You can print the associated coupon and take it with your prescription to the pharmacy.  - You may also stop by our office during regular business hours and pick up a GoodRx coupon card.  - If you need your prescription sent electronically to a different pharmacy, notify our office through [place] MyChart or by phone at [phone] option 4.     Si Usted Necesita Algo Despus de Su Visita  Tambin puede  enviarnos un mensaje a travs de MyChart. Por lo general respondemos a los mensajes de MyChart en el transcurso de 1 a 2 das hbiles.  Para renovar recetas, por favor pida a su farmacia que se ponga en contacto con nuestra oficina. Nuestro nmero de fax es el [phone].  Si tiene un asunto urgente cuando la clnica est cerrada y que no puede esperar hasta el siguiente da hbil, puede llamar/localizar a su doctor(a) al nmero que aparece a continuacin.   Por favor, tenga en cuenta que aunque hacemos todo lo posible para estar disponibles para asuntos urgentes fuera del horario de oficina, no estamos disponibles las 24 horas del da, los 7 das de la semana.   Si tiene un problema urgente y no puede comunicarse con nosotros, puede optar por buscar atencin mdica  en el consultorio de su doctor(a), en una clnica privada, en un centro de atencin urgente o en una sala de emergencias.  Si tiene una emergencia mdica, por favor llame inmediatamente al 911 o vaya a la sala de emergencias.  Nmeros de bper  - Dr. Kowalski: [phone]  - Dra. Moye: [phone]  - Dra. Stewart: [phone]  En caso de inclemencias del tiempo, por favor llame a nuestra lnea principal al [phone] para una actualizacin sobre el estado de cualquier retraso o cierre.  Consejos para la medicacin en dermatologa: Por favor, guarde las cajas en las que vienen los medicamentos de uso tpico para ayudarle a seguir las instrucciones sobre dnde y cmo usarlos. Las farmacias generalmente imprimen las instrucciones del medicamento slo en las cajas y no directamente en los tubos del medicamento.   Si su medicamento es muy caro, por favor, pngase en contacto con nuestra oficina llamando al [phone] y presione la opcin 4 o envenos un mensaje a travs de MyChart.   No podemos decirle cul ser su copago por los medicamentos por adelantado ya que esto es diferente dependiendo de la cobertura de su seguro.  Sin embargo, es posible que podamos encontrar un medicamento sustituto a menor costo o llenar un formulario para que el seguro cubra el medicamento que se considera necesario.   Si se requiere una autorizacin previa para que su compaa de seguros cubra su medicamento, por favor permtanos de 1 a 2 das hbiles para completar este proceso.  Los precios de los medicamentos varan con frecuencia dependiendo del lugar de dnde se surte la receta y alguna farmacias pueden ofrecer precios ms baratos.  El sitio web www.goodrx.com tiene cupones para medicamentos de diferentes farmacias. Los precios aqu no tienen en cuenta lo que podra costar con la ayuda del seguro (puede ser ms barato con su seguro), pero el sitio web puede darle el precio si no utiliz ningn seguro.  - Puede imprimir el cupn correspondiente y llevarlo con su receta a la farmacia.  - Tambin puede pasar por nuestra oficina durante el horario de atencin regular y recoger una tarjeta de cupones de GoodRx.  -   Si necesita que su receta se enve electrnicamente a una farmacia diferente, informe a nuestra oficina a travs de MyChart de [place] o por telfono llamando al [phone] y presione la opcin 4.  

## 2022-05-12 NOTE — Progress Notes (Signed)
Follow-Up Visit   Subjective  Jane Riley is a 71 y.o. female who presents for the following: Annual Exam (Mole check ). Hx of Dysplastic nevus. The patient presents for Total-Body Skin Exam (TBSE) for skin cancer screening and mole check.  The patient has spots, moles and lesions to be evaluated, some may be new or changing and the patient has concerns that these could be cancer.  Spot on chest and foot are itchy.   The following portions of the chart were reviewed this encounter and updated as appropriate:       Review of Systems:  No other skin or systemic complaints except as noted in HPI or Assessment and Plan.  Objective  Well appearing patient in no apparent distress; mood and affect are within normal limits.  A full examination was performed including scalp, head, eyes, ears, nose, lips, neck, chest, axillae, abdomen, back, buttocks, bilateral upper extremities, bilateral lower extremities, hands, feet, fingers, toes, fingernails, and toenails. All findings within normal limits unless otherwise noted below.  right lower chest x1, right spinal mid upper back x 1 (2) Keratotic macules  right sternal chest x 1, right medial heel x 1 (2) Stuck-on, waxy, tan-brown papules --Discussed benign etiology and prognosis.     Assessment & Plan  AK (actinic keratosis) (2) right lower chest x1, right spinal mid upper back x 1  Actinic keratoses are precancerous spots that appear secondary to cumulative UV radiation exposure/sun exposure over time. They are chronic with expected duration over 1 year. A portion of actinic keratoses will progress to squamous cell carcinoma of the skin. It is not possible to reliably predict which spots will progress to skin cancer and so treatment is recommended to prevent development of skin cancer.  Recommend daily broad spectrum sunscreen SPF 30+ to sun-exposed areas, reapply every 2 hours as needed.  Recommend staying in the shade or wearing long  sleeves, sun glasses (UVA+UVB protection) and wide brim hats (4-inch brim around the entire circumference of the hat). Call for new or changing lesions.   Destruction of lesion - right lower chest x1, right spinal mid upper back x 1  Destruction method: cryotherapy   Informed consent: discussed and consent obtained   Timeout:  patient name, date of birth, surgical site, and procedure verified Lesion destroyed using liquid nitrogen: Yes   Region frozen until ice ball extended beyond lesion: Yes   Outcome: patient tolerated procedure well with no complications   Post-procedure details: wound care instructions given   Additional details:  Prior to procedure, discussed risks of blister formation, small wound, skin dyspigmentation, or rare scar following cryotherapy. Recommend Vaseline ointment to treated areas while healing.   Inflamed seborrheic keratosis (2) right sternal chest x 1, right medial heel x 1  Symptomatic, irritating, patient would like treated.   Recheck right sternal chest   Destruction of lesion - right sternal chest x 1, right medial heel x 1  Destruction method: cryotherapy   Informed consent: discussed and consent obtained   Timeout:  patient name, date of birth, surgical site, and procedure verified Lesion destroyed using liquid nitrogen: Yes   Region frozen until ice ball extended beyond lesion: Yes   Outcome: patient tolerated procedure well with no complications   Post-procedure details: wound care instructions given   Additional details:  Prior to procedure, discussed risks of blister formation, small wound, skin dyspigmentation, or rare scar following cryotherapy. Recommend Vaseline ointment to treated areas while healing.   Actinic  Damage - chronic, secondary to cumulative UV radiation exposure/sun exposure over time - diffuse scaly erythematous macules with underlying dyspigmentation - Recommend daily broad spectrum sunscreen SPF 30+ to sun-exposed areas,  reapply every 2 hours as needed.  - Recommend staying in the shade or wearing long sleeves, sun glasses (UVA+UVB protection) and wide brim hats (4-inch brim around the entire circumference of the hat). - Call for new or changing lesions.   Lentigines - Scattered tan macules - Due to sun exposure - Benign-appering, observe - Recommend daily broad spectrum sunscreen SPF 30+ to sun-exposed areas, reapply every 2 hours as needed. - Call for any changes   Seborrheic Keratoses 2.5 cm waxy tan patch at the left abdomen - Benign-appearing - Discussed benign etiology and prognosis. - Observe - Call for any changes  Hemangiomas - Red papules - Discussed benign nature - Observe - Call for any changes    Melanocytic Nevi Left abdomen - Tan-brown and/or pink-flesh-colored symmetric macules and papules - Benign appearing on exam today - Observation - Call clinic for new or changing moles - Recommend daily use of broad spectrum spf 30+ sunscreen to sun-exposed areas.    History of Dysplastic Nevi Left post shoulder 2009 Left shoulder 2022 Left buttock 2019 - No evidence of recurrence today - Recommend regular full body skin exams - Recommend daily broad spectrum sunscreen SPF 30+ to sun-exposed areas, reapply every 2 hours as needed.  - Call if any new or changing lesions are noted between office visits   Return in about 6 months (around 11/12/2022) for ISK, Ak, TBSE in 12 months .  I, Marye Round, CMA, am acting as scribe for Brendolyn Patty, MD .   Documentation: I have reviewed the above documentation for accuracy and completeness, and I agree with the above.  Brendolyn Patty MD

## 2022-05-14 ENCOUNTER — Encounter: Payer: Self-pay | Admitting: Gastroenterology

## 2022-05-23 ENCOUNTER — Other Ambulatory Visit: Payer: Self-pay | Admitting: Gastroenterology

## 2022-05-23 DIAGNOSIS — K529 Noninfective gastroenteritis and colitis, unspecified: Secondary | ICD-10-CM

## 2022-05-23 DIAGNOSIS — K580 Irritable bowel syndrome with diarrhea: Secondary | ICD-10-CM

## 2022-06-01 LAB — 5 HIAA, QUANTITATIVE, URINE, 24 HOUR
5-HIAA, Ur: 1.5 mg/L
5-HIAA,Quant.,24 Hr Urine: 3.8 mg/24 hr (ref 0.0–14.9)

## 2022-06-24 ENCOUNTER — Encounter: Payer: Self-pay | Admitting: Gastroenterology

## 2022-07-10 ENCOUNTER — Encounter: Payer: Self-pay | Admitting: Gastroenterology

## 2022-07-18 ENCOUNTER — Telehealth: Payer: Self-pay

## 2022-07-18 ENCOUNTER — Other Ambulatory Visit: Payer: Self-pay

## 2022-07-18 NOTE — Telephone Encounter (Signed)
We receive by fax the provider portion of the application form for patient Creon for next year. Filled out form attached insurance, Medication and Allergies to it and faxed it back

## 2022-07-29 ENCOUNTER — Other Ambulatory Visit: Payer: Self-pay | Admitting: Gastroenterology

## 2022-07-29 DIAGNOSIS — K529 Noninfective gastroenteritis and colitis, unspecified: Secondary | ICD-10-CM

## 2022-07-29 DIAGNOSIS — K580 Irritable bowel syndrome with diarrhea: Secondary | ICD-10-CM

## 2022-08-25 ENCOUNTER — Encounter: Payer: Self-pay | Admitting: Gastroenterology

## 2022-08-25 ENCOUNTER — Ambulatory Visit: Payer: Medicare Other | Admitting: Gastroenterology

## 2022-08-25 VITALS — BP 128/77 | HR 75 | Temp 98.1°F | Ht 64.0 in | Wt 156.2 lb

## 2022-08-25 DIAGNOSIS — K8689 Other specified diseases of pancreas: Secondary | ICD-10-CM | POA: Diagnosis not present

## 2022-08-25 DIAGNOSIS — K219 Gastro-esophageal reflux disease without esophagitis: Secondary | ICD-10-CM | POA: Diagnosis not present

## 2022-08-25 MED ORDER — OMEPRAZOLE 20 MG PO CPDR: 20.0000 mg | DELAYED_RELEASE_CAPSULE | Freq: Every day | ORAL | 0 refills | Status: DC

## 2022-08-25 NOTE — Progress Notes (Signed)
Cephas Darby, MD [address]  [place]  [place], [place] [address]  Main: [phone]  Fax: [phone]    Gastroenterology Consultation  Referring Provider:     Dion Body, MD Primary Care Physician:  Dion Body, MD Primary Gastroenterologist:  Dr. Sherri Sear Reason for Consultation:   EPI        HPI:   Jane Riley is a 71 y.o. female referred by Dr. Dion Body, MD  for consultation & management of exocrine pancreatic insufficiency.  Patient had history of chronic diarrhea and gas, underwent work-up including upper endoscopy and colonoscopy which were unremarkable.  Celiac disease has not been ruled out.  Her pancreatic fecal elastase levels were 90.  Therefore, patient is started on Creon 36 K 2 capsules with each meal and 1 with snack.  Patient reports that her diarrhea has significantly improved.  She does have intermittent loose stools sometimes foul-smelling, experiences significant amount of gas.  She denies any abdominal bloating.  She also underwent CT abdomen pelvis with contrast which revealed normal pancreas.  Follow-up visit 04/22/2022 Patient is here for follow-up of exocrine pancreatic insufficiency.  Patient has ongoing symptoms of diarrhea despite being on pancreatic enzymes.  Patient is going through significant stress in her life with her husband being started on peritoneal dialysis and it is a big adjustment for her as well.  Made significant changes in their eating habits tailored towards dialysis diet.  Patient's weight has been stable.  She continues to take Creon 36 K 2 capsules with each meal and 1 with snack.  Creon is becoming expensive for her.  Follow-up visit 08/25/2021 Patient is here for follow-up of EPI.  Patient reports feeling significantly better on pancreatic enzymes.  She is no longer experiencing episodes of urgency or looking for the bathroom when she goes out to eat.  She reports that she still has mild symptoms  of gas and occasional loose stools which are not really interfering with her daily routine.  She does not her major concerns today.  She has been taking omeprazole 40 mg once a day at bedtime for a long time.  Her EGD was unremarkable in 2021.  She does not have active reflux symptoms at this time  NSAIDs: None  Antiplts/Anticoagulants/Anti thrombotics: None  GI Procedures:  EGD and colonoscopy 05/10/2020 - White nummular lesions in esophageal mucosa. Brushings performed. - Erythematous mucosa in the antrum. Biopsied. - Normal duodenal bulb, second portion of the duodenum and examined duodenum. - Biopsies were obtained in the gastric body, at the incisura and in the gastric antrum.  - Diverticulosis in the sigmoid colon. - Medium-sized lipoma in the cecum. Biopsied. - Tortuous colon. - The examination was otherwise normal. - The rectum, sigmoid colon, descending colon, transverse colon, ascending colon and cecum are normal. Biopsied. - Non-bleeding internal hemorrhoids.  DIAGNOSIS:  A. STOMACH ERYTHEMA; COLD BIOPSY:  - OXYNTIC MUCOSA WITH CHANGES CONSISTENT WITH PROTON PUMP INHIBITOR USE.  - NEGATIVE FOR H. PYLORI, DYSPLASIA, AND MALIGNANCY.   B.  STOMACH POLYP; COLD BIOPSY:  - FUNDIC GLAND POLYP.  - NEGATIVE FOR H. PYLORI, DYSPLASIA, AND MALIGNANCY.   C.  COLON, RANDOM; COLD BIOPSY:  - UNREMARKABLE COLONIC MUCOSA.  - NEGATIVE FOR MICROSCOPIC COLITIS, DYSPLASIA, AND MALIGNANCY.   D.  COLON LIPOMA, CECUM; COLD BIOPSY:  - COLONIC MUCOSA WITH SUBMUCOSAL ADIPOSE TISSUE, CONSISTENT WITH  ENDOSCOPIC IMPRESSION OF LIPOMA.  - NEGATIVE FOR DYSPLASIA AND MALIGNANCY.   Past Medical History:  Diagnosis Date   Arthritis    neck, hands   CRPS (complex regional pain syndrome)    Depression    Dysplastic nevus 12/07/2007   left post shoulder. Severe atypia. Excised: 01/17/2008, no residual neoplasm   Dysplastic nevus 12/22/2017   left buttock. Moderate atypia, limited margins free    Dysplastic nevus 05/06/2021   left shoulder. Moderate to severe. Shave removal  06/03/21   Family history of adverse reaction to anesthesia    mother nausea and vomiting   GERD (gastroesophageal reflux disease)    Hypercholesteremia    Hypertension    Osteoporosis    PONV (postoperative nausea and vomiting)     Past Surgical History:  Procedure Laterality Date   ABDOMINAL HYSTERECTOMY     CATARACT EXTRACTION W/PHACO Right 01/12/2017   Procedure: CATARACT EXTRACTION PHACO AND INTRAOCULAR LENS PLACEMENT ([place]) Right Restor Lens;  Surgeon: Ronnell Freshwater, MD;  Location: [facility];  Service: Ophthalmology;  Laterality: Right;  Restor Lens   CESAREAN SECTION     x2   CHOLECYSTECTOMY     COLONOSCOPY WITH PROPOFOL N/A 05/10/2020   Procedure: COLONOSCOPY WITH PROPOFOL;  Surgeon: Virgel Manifold, MD;  Location: [facility];  Service: Endoscopy;  Laterality: N/A;   ESOPHAGOGASTRODUODENOSCOPY (EGD) WITH PROPOFOL N/A 03/24/2018   Procedure: ESOPHAGOGASTRODUODENOSCOPY (EGD) WITH PROPOFOL;  Surgeon: Virgel Manifold, MD;  Location: [facility];  Service: Endoscopy;  Laterality: N/A;   ESOPHAGOGASTRODUODENOSCOPY (EGD) WITH PROPOFOL N/A 05/10/2020   Procedure: ESOPHAGOGASTRODUODENOSCOPY (EGD) WITH PROPOFOL;  Surgeon: Virgel Manifold, MD;  Location: [facility];  Service: Endoscopy;  Laterality: N/A;   FLEXIBLE BRONCHOSCOPY N/A 05/27/2018   Procedure: FLEXIBLE BRONCHOSCOPY;  Surgeon: [name], MD;  Location: [facility];  Service: Pulmonary;  Laterality: N/A;   NECK SURGERY  2008   4 level fusion.  plates and screws   TONSILLECTOMY     WRIST SURGERY Left 2014   implant but unsure of the type     Current Outpatient Medications:    Biotin 5000 MCG TABS, Take 5,000 mcg by mouth daily. , Disp: , Rfl:    busPIRone (BUSPAR) 5 MG tablet, Take 1 tablet by mouth 2 (two) times daily., Disp: , Rfl:    carboxymethylcellulose (REFRESH TEARS) 0.5 % SOLN, Apply to  eye., Disp: , Rfl:    celecoxib (CELEBREX) 200 MG capsule, Take 200 mg by mouth daily as needed., Disp: , Rfl:    Cholecalciferol (VITAMIN D3) 1000 units CAPS, Take 1,000 Units by mouth daily. , Disp: , Rfl:    citalopram (CELEXA) 20 MG tablet, Take 20 mg by mouth daily. , Disp: , Rfl:    Coenzyme Q10 (COQ10) 200 MG CAPS, Take 200 mg by mouth daily. , Disp: , Rfl:    diclofenac (CATAFLAM) 50 MG tablet, Take 50 mg by mouth 3 (three) times daily., Disp: , Rfl:    levocetirizine (XYZAL) 5 MG tablet, Take 1 tablet (5 mg total) by mouth every evening., Disp: 30 tablet, Rfl: 0   lipase/protease/amylase (CREON) 36000 UNITS CPEP capsule, Take 3 capsule with the first bite of each meal and 1 capsule with the first bite of each snack., Disp: 990 capsule, Rfl: 3   lisinopril-hydrochlorothiazide (ZESTORETIC) 20-12.5 MG tablet, Take 1 tablet by mouth daily., Disp: , Rfl:    lovastatin (MEVACOR) 40 MG tablet, Take by mouth., Disp: , Rfl:    Magnesium Oxide 500 MG TABS, Take 500 mg by mouth daily with breakfast. , Disp: , Rfl:    Omega-3  Fatty Acids (KP FISH OIL) 1200 MG CAPS, Take 1,200 mg by mouth daily. , Disp: , Rfl:    omeprazole (PRILOSEC) 20 MG capsule, Take 1 capsule (20 mg total) by mouth daily., Disp: 30 capsule, Rfl: 0   raloxifene (EVISTA) 60 MG tablet, Take 60 mg by mouth daily., Disp: , Rfl:    vitamin E 400 UNIT capsule, Take 400 Units by mouth daily., Disp: , Rfl:    Family History  Problem Relation Age of Onset   Breast cancer Cousin    Breast cancer Cousin      Social History   Tobacco Use   Smoking status: Former    Packs/day: 0.25    Years: 10.00    Total pack years: 2.50    Types: Cigarettes    Quit date: 10/06/2006    Years since quitting: 15.8   Smokeless tobacco: Never  Vaping Use   Vaping Use: Never used  Substance Use Topics   Alcohol use: Yes    Alcohol/week: 7.0 standard drinks of alcohol    Types: 7 Glasses of wine per week   Drug use: Never    Allergies as of  08/25/2022 - Review Complete 08/25/2022  Allergen Reaction Noted   Codeine Nausea And Vomiting and Nausea Only 02/20/2014   Doxycycline  03/10/2018   Adhesive [tape] Other (See Comments) 01/05/2017   Amlodipine Swelling 05/21/2018   Amoxicillin Rash 11/29/2014   Nsaids Other (See Comments) 02/20/2014   Other Itching 02/20/2014   Oxycodone Itching 05/21/2018   Pravastatin Other (See Comments) 02/20/2014    Review of Systems:    All systems reviewed and negative except where noted in HPI.   Physical Exam:  BP 128/77 (BP Location: Left Arm, Patient Position: Sitting, Cuff Size: Normal)   Pulse 75   Temp 98.1 F (36.7 C)   Ht _0  (1.626 m)   Wt 156 lb 4 oz (70.9 kg)   BMI 26.82 kg/m  No LMP recorded. Patient has had a hysterectomy.  General:   Alert,  Well-developed, well-nourished, pleasant and cooperative in NAD Head:  Normocephalic and atraumatic. Eyes:  Sclera clear, no icterus.   Conjunctiva pink. Ears:  Normal auditory acuity. Nose:  No deformity, discharge, or lesions. Mouth:  No deformity or lesions,oropharynx pink & moist. Neck:  Supple; no masses or thyromegaly. Lungs:  Respirations even and unlabored.  Clear throughout to auscultation.   No wheezes, crackles, or rhonchi. No acute distress. Heart:  Regular rate and rhythm; no murmurs, clicks, rubs, or gallops. Abdomen:  Normal bowel sounds. Soft, non-tender and non-distended without masses, hepatosplenomegaly or hernias noted.  No guarding or rebound tenderness.   Rectal: Not performed Msk:  Symmetrical without gross deformities. Good, equal movement & strength bilaterally. Pulses:  Normal pulses noted. Extremities:  No clubbing or edema.  No cyanosis. Neurologic:  Alert and oriented x3;  grossly normal neurologically. Skin:  Intact without significant lesions or rashes. No jaundice. Psych:  Alert and cooperative. Normal mood and affect.  Imaging Studies: Reviewed  Assessment and Plan:   Jane Riley is a 71  y.o. pleasant Caucasian female with history of chronic diarrhea, gas, diagnosed with severe EPI, pancreatic fecal elastase levels 90, currently on Creon.  Patient's majority of the symptoms have resolved and not interfering with daily routine.  EGD and colonoscopy were unremarkable.  CT abdomen and pelvis revealed normal-appearing pancreas.  Patient was not a heavy smoker in her life.  She does not have history of metabolic syndrome or  obesity. 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  She is currently on Celexa for about 10 years and her anxiety worsened recently, therefore her PCP started on BuSpar  Severe exocrine pancreatic insufficiency: Weight loss has stabilized Continue Creon 36 K 2 capsules with first bite of each meal and 1 with snack Celiac disease panel negative, GI profile PCR was negative S/p empiric trial of 2 weeks course of rifaximin for possible SIBO food allergy profile revealed allergy to egg whites and patient did a trial of complete elimination of egg whites for 2 weeks, did not notice any change in her symptoms, alpha gal panel was negative Serum gastrin, somatostatin, VIP, calcitonin levels as well as 24-hour urine 5 HIAA levels were unremarkable  Chronic GERD On long-term omeprazole 40 mg daily Advised patient to try omeprazole 20 mg daily for 2 weeks, then switch to Pepcid if she is not experiencing any recurrence of GERD symptoms  Follow up annually or as needed   Cephas Darby, MD

## 2022-08-27 NOTE — Telephone Encounter (Signed)
Patient as been approved form 10/06/2022 to 10/06/2023

## 2022-09-02 ENCOUNTER — Other Ambulatory Visit: Payer: Self-pay | Admitting: Gastroenterology

## 2022-11-05 ENCOUNTER — Encounter: Payer: Self-pay | Admitting: Gastroenterology

## 2022-11-18 ENCOUNTER — Ambulatory Visit: Payer: Medicare Other | Admitting: Dermatology

## 2022-11-19 ENCOUNTER — Ambulatory Visit: Payer: Medicare Other | Admitting: Internal Medicine

## 2022-11-19 ENCOUNTER — Telehealth: Payer: Self-pay | Admitting: Internal Medicine

## 2022-11-19 ENCOUNTER — Encounter: Payer: Self-pay | Admitting: Internal Medicine

## 2022-11-19 VITALS — BP 122/70 | HR 82 | Temp 98.0°F | Ht 64.0 in | Wt 152.4 lb

## 2022-11-19 DIAGNOSIS — J4540 Moderate persistent asthma, uncomplicated: Secondary | ICD-10-CM | POA: Diagnosis not present

## 2022-11-19 DIAGNOSIS — R053 Chronic cough: Secondary | ICD-10-CM | POA: Diagnosis not present

## 2022-11-19 MED ORDER — PREDNISONE 10 MG PO TABS: 40.0000 mg | ORAL_TABLET | Freq: Every day | ORAL | 1 refills | Status: DC

## 2022-11-19 MED ORDER — ALBUTEROL SULFATE HFA 108 (90 BASE) MCG/ACT IN AERS: 2.0000 | INHALATION_SPRAY | Freq: Four times a day (QID) | RESPIRATORY_TRACT | 2 refills | Status: DC | PRN

## 2022-11-19 NOTE — Patient Instructions (Addendum)
Continue antihistamines as prescribed Continue medications for reflux Incentive spirometry 10-15 times per day(breathing in exercise) Flutter valve 10-15 times per day(breathing out exercise)   COUGH-will prescribe prednisone 10 mg daily for 5 days Albuterol 2 puffs as needed every 6 hrs  Avoid secondhand smoke Avoid SICK contacts Recommend  Masking  when appropriate Recommend Keep up-to-date with vaccinations

## 2022-11-19 NOTE — Telephone Encounter (Signed)
I spoke with Jane Riley. She needed clarification on the Prednisone script that was sent in today.  Per Dr. Mortimer Fries, Prednisone 57m 1 tablet daily for 10 days.  I notified [name]  Nothing further needed.

## 2022-11-19 NOTE — Progress Notes (Signed)
[facility] Consultation     Date: 11/19/2022  MRN# [id number] Jane Riley [birth year]   SYNOPSIS The patient is a 72 year old female with a cough which started in the first week of June, but has been present on and off for about 3 years. At last visit was taken off ace and switched to Norvasc but stopped due to leg swelling. She was started on Arnuity sample which did not help. She has been restarted on lisinopril after a trial off of it which made no difference.   She underwent bronchoscopy on 05/27/18, this showed changes of Tracheobronchopathia osteochondroplastica, chronic bronchitis. Cultures, AFB were negative.   She has a dog, which sleeps in the bed.  She was tested for allergies, she was allergic to several things, but not the dog. She has completed allergy shots.  She does not snore at night, has never been tested for OSA, she is not sleepy during the day.     **CT chest 09/07/2018>> images personally reviewed, in comparison with previous on 03/19/2018 there remain tiny bilateral nodules, largest of which is about 6 mm, not significantly changed from previous. **CT chest 03/19/2018>> images personally reviewed, there are tiny bilateral basal nodules, largest of which is 7 mm. **Absolute eosinophil count 05/28/2017>> 100  **PFT 11/23/2016>> tracings personally reviewed, consistent with normal pulmonary functions. 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  Clinical Correlation Advised   Based  on above findings of pulmonary function testing approximately 1 year ago and the change in the PFTs most likely has some underlying reactive airways disease with bronchospasms of  small obstructive airways disease with hyperinflation and air trapping  CC  Follow up Reactive airways disease    HPI  Chronic cough for many years Significant secondhand smoke exposure in the past Quit tobacco 17 years ago  Retired from [employer] as a Librarian, 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 and discharge Nasal congestion which drains into her chest  Had viral infection in Dec 2023 Lingering cough Would like some prednisone Educated patient about albuterol  Currently on Xyzal antihistamine for allergic rhinitis  antireflux medicine for GERD  She does have a dog at home she denies having allergies to dogs or foods She is sensitive to bleaches and some perfumes  I reassured the patient that bilateral pulmonary nodules subcentimeter in size does not cause chronic bronchitis  No exacerbation at this time No evidence of heart failure at this time No evidence or signs of infection at this time No respiratory distress No fevers, chills, nausea, vomiting, diarrhea No evidence of lower extremity edema No evidence hemoptysis  Medication:    Current Outpatient Medications:    amitriptyline (ELAVIL) 25 MG tablet, TAKE 1 TABLET BY MOUTH NIGHTLY, Disp: 30 tablet, Rfl: 3   Biotin 5000 MCG TABS, Take 5,000 mcg by mouth daily. , Disp: , Rfl:    busPIRone (BUSPAR) 5 MG tablet, Take 1 tablet by mouth 2 (two) times daily., Disp: , Rfl:    carboxymethylcellulose (REFRESH TEARS) 0.5 % SOLN, Apply to eye., Disp: ,  Rfl:    celecoxib (CELEBREX) 200 MG capsule, Take 200 mg by mouth daily as needed., Disp: , Rfl:    Cholecalciferol (VITAMIN D3) 1000 units CAPS, Take 1,000 Units by mouth daily. , Disp: , Rfl:    citalopram (CELEXA) 20 MG tablet, Take 20 mg by mouth daily. , Disp: , Rfl:    Coenzyme Q10 (COQ10) 200 MG  CAPS, Take 200 mg by mouth daily. , Disp: , Rfl:    diclofenac (CATAFLAM) 50 MG tablet, Take 50 mg by mouth 3 (three) times daily., Disp: , Rfl:    levocetirizine (XYZAL) 5 MG tablet, Take 1 tablet (5 mg total) by mouth every evening., Disp: 30 tablet, Rfl: 0   lipase/protease/amylase (CREON) 36000 UNITS CPEP capsule, Take 3 capsule with the first bite of each meal and 1 capsule with the first bite of each snack., Disp: 990 capsule, Rfl: 3   lisinopril-hydrochlorothiazide (ZESTORETIC) 20-12.5 MG tablet, Take 1 tablet by mouth daily., Disp: , Rfl:    lovastatin (MEVACOR) 40 MG tablet, Take by mouth., Disp: , Rfl:    Magnesium Oxide 500 MG TABS, Take 500 mg by mouth daily with breakfast. , Disp: , Rfl:    Omega-3 Fatty Acids (KP FISH OIL) 1200 MG CAPS, Take 1,200 mg by mouth daily. , Disp: , Rfl:    omeprazole (PRILOSEC) 20 MG capsule, Take 1 capsule (20 mg total) by mouth daily., Disp: 30 capsule, Rfl: 0   raloxifene (EVISTA) 60 MG tablet, Take 60 mg by mouth daily., Disp: , Rfl:    vitamin E 400 UNIT capsule, Take 400 Units by mouth daily., Disp: , Rfl:    Allergies:  Codeine, Doxycycline, Adhesive [tape], Amlodipine, Amoxicillin, Nsaids, Other, Oxycodone, and Pravastatin   BP 122/70 (BP Location: Left Arm, Cuff Size: Normal)   Pulse 82   Temp 98 F (36.7 C) (Temporal)   Ht 5' 4"$  (1.626 m)   Wt 152 lb 6.4 oz (69.1 kg)   SpO2 96%   BMI 26.16 kg/m       Review of Systems: Gen:  Denies  fever, sweats, chills weight loss  HEENT: Denies blurred vision, double vision, ear pain, eye pain, hearing loss, nose bleeds, sore throat Cardiac:  No dizziness, chest pain or heaviness, chest tightness,edema, No JVD Resp:   No cough, -sputum production, -shortness of breath,-wheezing, -hemoptysis,  Other:  All other systems negative    Physical Examination:   General Appearance: No distress  EYES PERRLA, EOM intact.   NECK Supple, No JVD Pulmonary: normal breath sounds, No wheezing.   CardiovascularNormal S1,S2.  No m/r/g.   Abdomen: Benign, Soft, non-tender. 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 Incentive spirometry 10-15 times per day Flutter valve 10-15 times per day Consider albuterol inhaler therapy as needed Prednisone 10 mg daily for 5 day  Continue antihistamine for chronic allergic rhinitis Avoid allergens COUGH-will prescribe prednisone 10 mg daily for 5 days Albuterol 2-4 puffs as needed every 4-6 hrs   Tiny lung nodules largest measuring 6 mm Very low risk for lung cancer Unchanged over the last several years CT of the chest reviewed in detail with the  patient patient has CT scan of her abdomen in November 2022 and previous CT chest was December 2020 Findings suggest benign findings in the left lower lobe nodules pleural-based  Follow up CT scan at next visit  MEDICATION ADJUSTMENTS/LABS AND TESTS ORDERED: Continue antihistamines as prescribed Continue medications for reflux Incentive spirometry 10-15 times per day(breathing in exercise) Flutter valve 10-15 times per day(breathing out exercise) COUGH-will prescribe prednisone 10 mg daily for 5 days Albuterol 2 puffs as needed every 6 hrs  Avoid secondhand smoke Avoid SICK contacts Recommend  Masking  when appropriate Recommend Keep up-to-date with vaccinations  CURRENT MEDICATIONS REVIEWED AT [facility]   Patient satisfied with Plan of action and management. All questions answered  Follow up 1 year Total time spent 25 mins    Maretta Bees Patricia Pesa, M.D.   [facility]  Medical Director [place] Director [facility] Cardio-Pulmonary Department

## 2022-11-26 ENCOUNTER — Ambulatory Visit: Payer: Medicare Other | Admitting: Dermatology

## 2022-11-26 VITALS — BP 144/77 | HR 83

## 2022-11-26 DIAGNOSIS — D692 Other nonthrombocytopenic purpura: Secondary | ICD-10-CM

## 2022-11-26 DIAGNOSIS — L0100 Impetigo, unspecified: Secondary | ICD-10-CM | POA: Diagnosis not present

## 2022-11-26 DIAGNOSIS — L578 Other skin changes due to chronic exposure to nonionizing radiation: Secondary | ICD-10-CM

## 2022-11-26 DIAGNOSIS — L820 Inflamed seborrheic keratosis: Secondary | ICD-10-CM | POA: Diagnosis not present

## 2022-11-26 DIAGNOSIS — Z872 Personal history of diseases of the skin and subcutaneous tissue: Secondary | ICD-10-CM | POA: Diagnosis not present

## 2022-11-26 DIAGNOSIS — L738 Other specified follicular disorders: Secondary | ICD-10-CM

## 2022-11-26 DIAGNOSIS — L821 Other seborrheic keratosis: Secondary | ICD-10-CM

## 2022-11-26 DIAGNOSIS — L814 Other melanin hyperpigmentation: Secondary | ICD-10-CM

## 2022-11-26 MED ORDER — MUPIROCIN 2 % EX OINT: TOPICAL_OINTMENT | CUTANEOUS | 3 refills | Status: DC

## 2022-11-26 NOTE — Progress Notes (Signed)
Follow-Up Visit   Subjective  Jane Riley is a 72 y.o. female who presents for the following: Follow-up.  Patient presents for 6 month follow-up Aks and ISKs. She has new spots to check, some itchy, of the R upper inner arm, L forearm/wrist, R calf, L thigh, and abdomen.   The following portions of the chart were reviewed this encounter and updated as appropriate:       Review of Systems:  No other skin or systemic complaints except as noted in HPI or Assessment and Plan.  Objective  Well appearing patient in no apparent distress; mood and affect are within normal limits.  A focused examination was performed including face, chest, back, arms, legs. Relevant physical exam findings are noted in the Assessment and Plan.  mid chest x 4 (1 residual), R upper inner arm x 2, L flexor forearm x 1, R calf x 1, L upper inner thigh x 1, L abd x 1 (10) Erythematous stuck-on, waxy papule or plaque  Nose Clear today    Assessment & Plan  Actinic Damage - chronic, secondary to cumulative UV radiation exposure/sun exposure over time - diffuse scaly erythematous macules with underlying dyspigmentation - Recommend daily broad spectrum sunscreen SPF 30+ to sun-exposed areas, reapply every 2 hours as needed.  - Recommend staying in the shade or wearing long sleeves, sun glasses (UVA+UVB protection) and wide brim hats (4-inch brim around the entire circumference of the hat). - Call for new or changing lesions.  History of PreCancerous Actinic Keratosis - clear today - site(s) of PreCancerous Actinic Keratosis clear today. - these may recur and new lesions may form requiring treatment to prevent transformation into skin cancer - observe for new or changing spots and contact [place] for appointment if occur - photoprotection with sun protective clothing; sunglasses and broad spectrum sunscreen with SPF of at least 30 + and frequent self skin exams recommended - yearly exams by a  dermatologist recommended for persons with history of PreCancerous Actinic Keratoses  Seborrheic Keratoses - Stuck-on, waxy, tan-brown papules and/or plaques, including right zygoma  - Benign-appearing - Discussed benign etiology and prognosis. - Observe - Call for any changes  Sebaceous Hyperplasia - Small yellow papules with a central dell - Benign - Observe  Purpura - Chronic; persistent and recurrent.  Treatable, but not curable. - Violaceous macules and patches, left wrist - Benign - Related to trauma, age, sun damage and/or use of blood thinners, chronic use of topical and/or oral steroids - Observe - Can use OTC arnica containing moisturizer such as Dermend Bruise Formula if desired - Call for worsening or other concerns  Lentigines - Scattered tan macules - Due to sun exposure - Benign-appearing, observe - Recommend daily broad spectrum sunscreen SPF 30+ to sun-exposed areas, reapply every 2 hours as needed. - Call for any changes  Inflamed seborrheic keratosis (10) mid chest x 4 (1 residual), R upper inner arm x 2, L flexor forearm x 1, R calf x 1, L upper inner thigh x 1, L abd x 1  Symptomatic, irritating, patient would like treated. Patient advised lesion on the left abdomen may need several treatments to clear/improve.   Destruction of lesion - mid chest x 4 (1 residual), R upper inner arm x 2, L flexor forearm x 1, R calf x 1, L upper inner thigh x 1, L abd x 1  Destruction method: cryotherapy   Informed consent: discussed and consent obtained   Lesion destroyed using  liquid nitrogen: Yes   Region frozen until ice ball extended beyond lesion: Yes   Outcome: patient tolerated procedure well with no complications   Post-procedure details: wound care instructions given   Additional details:  Prior to procedure, discussed risks of blister formation, small wound, skin dyspigmentation, or rare scar following cryotherapy. Recommend Vaseline ointment to treated areas  while healing.   Impetigo Nose  History of Impetigo - clear today but gets recurrences  Continue mupirocin 2% ointment BID x 5 days as needed 22g 3Rf.  mupirocin ointment (BACTROBAN) 2 % - Nose Apply in nares BID x 5 days prn flares.   Return as scheduled, for TBSE.  IJamesetta Orleans, CMA, am acting as scribe for Brendolyn Patty, MD .  Documentation: I have reviewed the above documentation for accuracy and completeness, and I agree with the above.  Brendolyn Patty MD

## 2022-11-26 NOTE — Patient Instructions (Addendum)
Cryotherapy Aftercare  Wash gently with soap and water everyday.   Apply Vaseline and Band-Aid daily until healed.     Due to recent changes in healthcare laws, you may see results of your pathology and/or laboratory studies on MyChart before the doctors have had a chance to review them. We understand that in some cases there may be results that are confusing or concerning to you. Please understand that not all results are received at the same time and often the doctors may need to interpret multiple results in order to provide you with the best plan of care or course of treatment. Therefore, we ask that you please give us 2 business days to thoroughly review all your results before contacting the office for clarification. Should we see a critical lab result, you will be contacted sooner.   If You Need Anything After Your Visit  If you have any questions or concerns for your doctor, please call our main line at [phone] and press option 4 to reach your doctor's medical assistant. If no one answers, please leave a voicemail as directed and we will return your call as soon as possible. Messages left after 4 pm will be answered the following business day.   You may also send us a message via MyChart. We typically respond to MyChart messages within 1-2 business days.  For prescription refills, please ask your pharmacy to contact our office. Our fax number is [phone].  If you have an urgent issue when the clinic is closed that cannot wait until the next business day, you can page your doctor at the number below.    Please note that while we do our best to be available for urgent issues outside of office hours, we are not available 24/7.   If you have an urgent issue and are unable to reach us, you may choose to seek medical care at your doctor's office, retail clinic, urgent care center, or emergency room.  If you have a medical emergency, please immediately call 911 or go to the  emergency department.  Pager Numbers  - Dr. Kowalski: [phone]  - Dr. Moye: [phone]  - Dr. Stewart: [phone]  In the event of inclement weather, please call our main line at [phone] for an update on the status of any delays or closures.  Dermatology Medication Tips: Please keep the boxes that topical medications come in in order to help keep track of the instructions about where and how to use these. Pharmacies typically print the medication instructions only on the boxes and not directly on the medication tubes.   If your medication is too expensive, please contact our office at [phone] option 4 or send us a message through MyChart.   We are unable to tell what your co-pay for medications will be in advance as this is different depending on your insurance coverage. However, we may be able to find a substitute medication at lower cost or fill out paperwork to get insurance to cover a needed medication.   If a prior authorization is required to get your medication covered by your insurance company, please allow us 1-2 business days to complete this process.  Drug prices often vary depending on where the prescription is filled and some pharmacies may offer cheaper prices.  The website www.goodrx.com contains coupons for medications through different pharmacies. The prices here do not account for what the cost may be with help from insurance (it may be cheaper with your insurance), but the website can   give you the price if you did not use any insurance.  - You can print the associated coupon and take it with your prescription to the pharmacy.  - You may also stop by our office during regular business hours and pick up a GoodRx coupon card.  - If you need your prescription sent electronically to a different pharmacy, notify our office through [facility] MyChart or by phone at [phone] option 4.     Si Usted Necesita Algo Despus de Su Visita  Tambin puede  enviarnos un mensaje a travs de MyChart. Por lo general respondemos a los mensajes de MyChart en el transcurso de 1 a 2 das hbiles.  Para renovar recetas, por favor pida a su farmacia que se ponga en contacto con nuestra oficina. Nuestro nmero de fax es el [phone].  Si tiene un asunto urgente cuando la clnica est cerrada y que no puede esperar hasta el siguiente da hbil, puede llamar/localizar a su doctor(a) al nmero que aparece a continuacin.   Por favor, tenga en cuenta que aunque hacemos todo lo posible para estar disponibles para asuntos urgentes fuera del horario de oficina, no estamos disponibles las 24 horas del da, los 7 das de la semana.   Si tiene un problema urgente y no puede comunicarse con nosotros, puede optar por buscar atencin mdica  en el consultorio de su doctor(a), en una clnica privada, en un centro de atencin urgente o en una sala de emergencias.  Si tiene una emergencia mdica, por favor llame inmediatamente al 911 o vaya a la sala de emergencias.  Nmeros de bper  - Dr. Kowalski: [phone]  - Dra. Moye: [phone]  - Dra. Stewart: [phone]  En caso de inclemencias del tiempo, por favor llame a nuestra lnea principal al [phone] para una actualizacin sobre el estado de cualquier retraso o cierre.  Consejos para la medicacin en dermatologa: Por favor, guarde las cajas en las que vienen los medicamentos de uso tpico para ayudarle a seguir las instrucciones sobre dnde y cmo usarlos. Las farmacias generalmente imprimen las instrucciones del medicamento slo en las cajas y no directamente en los tubos del medicamento.   Si su medicamento es muy caro, por favor, pngase en contacto con nuestra oficina llamando al [phone] y presione la opcin 4 o envenos un mensaje a travs de MyChart.   No podemos decirle cul ser su copago por los medicamentos por adelantado ya que esto es diferente dependiendo de la cobertura de su seguro.  Sin embargo, es posible que podamos encontrar un medicamento sustituto a menor costo o llenar un formulario para que el seguro cubra el medicamento que se considera necesario.   Si se requiere una autorizacin previa para que su compaa de seguros cubra su medicamento, por favor permtanos de 1 a 2 das hbiles para completar este proceso.  Los precios de los medicamentos varan con frecuencia dependiendo del lugar de dnde se surte la receta y alguna farmacias pueden ofrecer precios ms baratos.  El sitio web www.goodrx.com tiene cupones para medicamentos de diferentes farmacias. Los precios aqu no tienen en cuenta lo que podra costar con la ayuda del seguro (puede ser ms barato con su seguro), pero el sitio web puede darle el precio si no utiliz ningn seguro.  - Puede imprimir el cupn correspondiente y llevarlo con su receta a la farmacia.  - Tambin puede pasar por nuestra oficina durante el horario de atencin regular y recoger una tarjeta de cupones de GoodRx.  -   Si necesita que su receta se enve electrnicamente a una farmacia diferente, informe a nuestra oficina a travs de MyChart de [place] o por telfono llamando al [phone] y presione la opcin 4.  

## 2022-12-03 DIAGNOSIS — G43109 Migraine with aura, not intractable, without status migrainosus: Secondary | ICD-10-CM | POA: Insufficient documentation

## 2023-03-23 ENCOUNTER — Other Ambulatory Visit: Payer: Self-pay | Admitting: Family Medicine

## 2023-03-23 DIAGNOSIS — Z1231 Encounter for screening mammogram for malignant neoplasm of breast: Secondary | ICD-10-CM

## 2023-04-24 ENCOUNTER — Ambulatory Visit
Admission: RE | Admit: 2023-04-24 | Discharge: 2023-04-24 | Disposition: A | Discharge status: Home / Self Care | Payer: Medicare Other | Source: Ambulatory Visit | Attending: Family Medicine | Admitting: Family Medicine

## 2023-04-24 DIAGNOSIS — Z1231 Encounter for screening mammogram for malignant neoplasm of breast: Secondary | ICD-10-CM

## 2023-04-27 ENCOUNTER — Other Ambulatory Visit: Payer: Self-pay

## 2023-04-27 DIAGNOSIS — K8681 Exocrine pancreatic insufficiency: Secondary | ICD-10-CM | POA: Insufficient documentation

## 2023-04-28 NOTE — Progress Notes (Unsigned)
Celso Amy, PA-C [address]  [address]  [place], [place] [address]  Main: [phone]  Fax: [phone]   Gastroenterology Consultation  Referring Provider:     Marisue Ivan, MD Primary Care Physician:  Marisue Ivan, MD Primary Gastroenterologist:  Celso Amy, PA-C / Dr. Lannette Donath   Reason for Consultation:     Stomach Issues; Chronic Diarrhea; EPI; IBS-D        HPI:   Jane Riley is a 72 y.o. y/o female referred for consultation & management  by Marisue Ivan, MD.    Patient states she is having acute epigastric and left upper quadrant pain for 6 weeks.  It radiates to her left upper back and wakes her in the middle of the night.  It is a dull ache.  Not sharp.  Not affected by eating.  She has been taking NSAIDs for back pain.  She denies melena, hematochezia, nausea, vomiting, or weight loss.  Currently having 3 bowel movements daily which are irregular.  Some formed, some loose, and some watery stools.  Creon has helped her diarrhea.  She denies lower abdominal pain.  Patient last saw Dr. Allegra Lai 08/2022 to follow-up with severe pancreatic insufficiency.  Has been treated and improved on Creon 36,000 lipase units 2 capsules with each meal and 1 with each snack.  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  Hx Chronic GERD on long term PPI Omeprazole.  -Previous Cholecystectomy & Hysterectomy. -Pancreatic fecal elastase very low = 90. - EGD / Colon 05/2020 Unremarkable.  NO H. Pylori or Microscopic Colitis. -Abd / Pelvic CT 08/2021 normal pancreas. -Previous negative celiac labs and negative GI pathogen panel. -Tried rifaximin for possible SIBO with little benefit. -Food allergy panel showed allergy to egg whites.  Alpha gal panel negative. -Serum gastrin, somatostatin, VIP, calcitonin levels as well as 24-hour urine 5 HIAA levels.  Past Medical History:  Diagnosis Date   Arthritis    neck, hands   CRPS (complex regional pain syndrome)    Depression     Dysplastic nevus 12/07/2007   left post shoulder. Severe atypia. Excised: 01/17/2008, no residual neoplasm   Dysplastic nevus 12/22/2017   left buttock. Moderate atypia, limited margins free   Dysplastic nevus 05/06/2021   left shoulder. Moderate to severe. Shave removal  06/03/21   Family history of adverse reaction to anesthesia    mother nausea and vomiting   GERD (gastroesophageal reflux disease)    Hypercholesteremia    Hypertension    Osteoporosis    PONV (postoperative nausea and vomiting)     Past Surgical History:  Procedure Laterality Date   ABDOMINAL HYSTERECTOMY     CATARACT EXTRACTION W/PHACO Right 01/12/2017   Procedure: CATARACT EXTRACTION PHACO AND INTRAOCULAR LENS PLACEMENT (IOC) Right Restor Lens;  Surgeon: Sherald Hess, MD;  Location[facility];  Service: Ophthalmology;  Laterality: Right;  Restor Lens   CESAREAN SECTION     x2   CHOLECYSTECTOMY     COLONOSCOPY WITH PROPOFOL N/A 05/10/2020   Procedure: COLONOSCOPY WITH PROPOFOL;  Surgeon: Pasty Spillers, MD;  Location: [facility];  Service: Endoscopy;  Laterality: N/A;   ESOPHAGOGASTRODUODENOSCOPY (EGD) WITH PROPOFOL N/A 03/24/2018   Procedure: ESOPHAGOGASTRODUODENOSCOPY (EGD) WITH PROPOFOL;  Surgeon: Pasty Spillers, MD;  Location: [facility];  Service: Endoscopy;  Laterality: N/A;   ESOPHAGOGASTRODUODENOSCOPY (EGD) WITH PROPOFOL N/A 05/10/2020   Procedure: ESOPHAGOGASTRODUODENOSCOPY (EGD) WITH PROPOFOL;  Surgeon: Pasty Spillers, MD;  Location: [facility];  Service: Endoscopy;  Laterality: N/A;   FLEXIBLE BRONCHOSCOPY N/A 05/27/2018   Procedure: FLEXIBLE BRONCHOSCOPY;  Surgeon: Shane Crutch, MD;  Location: [facility];  Service: Pulmonary;  Laterality: N/A;   NECK SURGERY  2008   4 level fusion.  plates and screws   TONSILLECTOMY     WRIST SURGERY Left 2014   implant but unsure of the type    Prior to Admission medications   Medication Sig Start Date End Date  Taking? Authorizing Provider  albuterol (VENTOLIN HFA) 108 (90 Base) MCG/ACT inhaler Inhale 2 puffs into the lungs every 6 (six) hours as needed for wheezing or shortness of breath. 11/19/22   Erin Fulling, MD  Biotin 5000 MCG TABS Take 5,000 mcg by mouth daily.     [provider]  carboxymethylcellulose (REFRESH TEARS) 0.5 % SOLN Apply to eye.    [provider]  Cholecalciferol (VITAMIN D3) 1000 units CAPS Take 1,000 Units by mouth daily.     [provider]  citalopram (CELEXA) 20 MG tablet Take 20 mg by mouth daily.     [provider]  Coenzyme Q10 (COQ10) 200 MG CAPS Take 200 mg by mouth daily.     [provider]  diclofenac (CATAFLAM) 50 MG tablet Take 50 mg by mouth 3 (three) times daily.    [provider]  levocetirizine (XYZAL) 5 MG tablet Take 1 tablet (5 mg total) by mouth every evening. 10/07/17   Ratcliffe, Heather R, PA-C  lipase/protease/amylase (CREON) 36000 UNITS CPEP capsule Take 3 capsule with the first bite of each meal and 1 capsule with the first bite of each snack. 04/22/22   Toney Reil, MD  lisinopril-hydrochlorothiazide (ZESTORETIC) 20-12.5 MG tablet Take 1 tablet by mouth daily. 02/17/22   [provider]  lovastatin (MEVACOR) 40 MG tablet Take by mouth. 10/04/21   [provider]  Magnesium Oxide 500 MG TABS Take 500 mg by mouth daily with breakfast.     [provider]  mupirocin ointment (BACTROBAN) 2 % Apply in nares BID x 5 days prn flares. 11/26/22   Willeen Niece, MD  Omega-3 Fatty Acids (KP FISH OIL) 1200 MG CAPS Take 1,200 mg by mouth daily.     [provider]  omeprazole (PRILOSEC) 20 MG capsule Take 1 capsule (20 mg total) by mouth daily. 08/25/22   Toney Reil, MD  predniSONE (DELTASONE) 10 MG tablet Take 4 tablets (40 mg total) by mouth daily with breakfast. 10 days 11/19/22   Erin Fulling, MD  raloxifene (EVISTA) 60 MG tablet Take 60 mg by mouth daily.     [provider]  vitamin E 400 UNIT capsule Take 400 Units by mouth daily.    [provider]    Family History  Problem Relation Age of Onset   Breast cancer Cousin    Breast cancer Cousin      Social History   Tobacco Use   Smoking status: Former    Current packs/day: 0.00    Average packs/day: 0.3 packs/day for 10.0 years (2.5 ttl pk-yrs)    Types: Cigarettes    Start date: 10/06/1996    Quit date: 10/06/2006    Years since quitting: 16.5   Smokeless tobacco: Never  Vaping Use   Vaping status: Never Used  Substance Use Topics   Alcohol use: Yes    Alcohol/week: 7.0 standard drinks of alcohol    Types: 7 Glasses of wine per week   Drug use: Never    Allergies as  of 04/29/2023 - Review Complete 04/29/2023  Allergen Reaction Noted   Codeine Nausea And Vomiting and Nausea Only 02/20/2014   Doxycycline  03/10/2018   Adhesive [tape] Other (See Comments) 01/05/2017   Amlodipine Swelling 05/21/2018   Amoxicillin Rash 11/29/2014   Nsaids Other (See Comments) 02/20/2014   Other Itching 02/20/2014   Oxycodone Itching 05/21/2018   Pravastatin Other (See Comments) 02/20/2014    Review of Systems:    All systems reviewed and negative except where noted in HPI.   Physical Exam:  BP (!) 156/87   Pulse 73   Temp 98.2 F (36.8 C)   Ht 5\' 4"  (1.626 m)   Wt 152 lb (68.9 kg)   BMI 26.09 kg/m  No LMP recorded. Patient has had a hysterectomy. Psych:  Alert and cooperative. Normal mood and affect. General:   Alert,  Well-developed, well-nourished, pleasant and cooperative in NAD Eyes:  Sclera clear, no icterus.   Conjunctiva pink. Neck:  Supple; no masses or thyromegaly. Lungs:  Respirations even and unlabored.  Clear throughout to auscultation.   No wheezes, crackles, or rhonchi. No acute distress. Heart:  Regular rate and rhythm; no murmurs, clicks, rubs, or gallops. Abdomen:  Normal bowel sounds.  No bruits.  Soft, and non-distended without masses,  hepatosplenomegaly or hernias noted.  Moderate Epigastric and LUQ Tenderness. No RUQ or lower abdominal tenderness.  No guarding or rebound tenderness.    Neurologic:  Alert and oriented x3;  grossly normal neurologically. Psych:  Alert and cooperative. Normal mood and affect.  Imaging Studies: MM 3D SCREENING MAMMOGRAM BILATERAL BREAST  Result Date: 04/27/2023 CLINICAL DATA:  Screening. EXAM: DIGITAL SCREENING BILATERAL MAMMOGRAM WITH TOMOSYNTHESIS AND CAD TECHNIQUE: Bilateral screening digital craniocaudal and mediolateral oblique mammograms were obtained. Bilateral screening digital breast tomosynthesis was performed. The images were evaluated with computer-aided detection. COMPARISON:  Previous exam(s). ACR Breast Density Category b: There are scattered areas of fibroglandular density. FINDINGS: There are no findings suspicious for malignancy. IMPRESSION: No mammographic evidence of malignancy. A result letter of this screening mammogram will be mailed directly to the patient. RECOMMENDATION: Screening mammogram in one year. (Code:SM-B-01Y) BI-RADS CATEGORY  1: Negative. Electronically Signed   By: Jacob Moores M.D.   On: 04/27/2023 12:59    Assessment and Plan:   AMONDA BRILLHART is a 72 y.o. y/o female has been referred for follow-up of chronic diarrhea.  She has exocrine pancreatic insufficiency treated with Creon 36,000 lipase units.  History of IBS-D.  Could also have bile salt diarrhea postcholecystectomy.  She has been having acute epigastric and left upper quadrant pain for 6 weeks.  I am ordering lab work and abdominal CT for further evaluation with follow-up.  1.  Exocrine pancreatic insufficiency -improved on Creon.  Continue Creon 36,000 lipase units, 2 capsules with each meal and 1 with each snack.  Check iron, B12, and vitamin D labs.  2.  Epigastric pain and LUQ pain, acute for 6 weeks  Labs: CBC, CMP, lipase.  Schedule abdominal CT with contrast.  Rule out pancreatitis.  If  test are unrevealing and pain persist, consider repeat EGD.  3.  Pain or perianal dermatitis/itching: Suspect irritant dermatitis from diarrhea versus Candida.  Try OTC Balmex cream as a skin protectant after frequent bowel movements.  Try prescription nystatin cream twice daily for 1 or 2 weeks.  4.  IBS-D  Continue current treatment.     Follow up in 2 months.  Also follow-up based on test results.  Celso Amy,  PA-C

## 2023-04-29 ENCOUNTER — Encounter: Payer: Self-pay | Admitting: Physician Assistant

## 2023-04-29 ENCOUNTER — Ambulatory Visit: Payer: Medicare Other | Admitting: Physician Assistant

## 2023-04-29 VITALS — BP 156/87 | HR 73 | Temp 98.2°F | Ht 64.0 in | Wt 152.0 lb

## 2023-04-29 DIAGNOSIS — R1013 Epigastric pain: Secondary | ICD-10-CM | POA: Diagnosis not present

## 2023-04-29 DIAGNOSIS — K580 Irritable bowel syndrome with diarrhea: Secondary | ICD-10-CM

## 2023-04-29 DIAGNOSIS — R1012 Left upper quadrant pain: Secondary | ICD-10-CM | POA: Diagnosis not present

## 2023-04-29 DIAGNOSIS — K8689 Other specified diseases of pancreas: Secondary | ICD-10-CM | POA: Diagnosis not present

## 2023-04-29 DIAGNOSIS — L309 Dermatitis, unspecified: Secondary | ICD-10-CM

## 2023-04-29 MED ORDER — PANCRELIPASE (LIP-PROT-AMYL) 36000-114000 UNITS PO CPEP: ORAL_CAPSULE | ORAL | 3 refills | Status: DC

## 2023-04-29 MED ORDER — NYSTATIN 100000 UNIT/GM EX CREA: 1.0000 | TOPICAL_CREAM | Freq: Two times a day (BID) | CUTANEOUS | 0 refills | Status: AC

## 2023-04-29 NOTE — Patient Instructions (Addendum)
CT scheduled @ 05/01/23 @ 10;30 arrival.  [facility] [address]  [place]   Try OTC Balmex Cream to protect skin around anus.  Try Rx Nystatin Cream twice daily for possible yeast infection.

## 2023-04-30 ENCOUNTER — Telehealth: Payer: Self-pay

## 2023-04-30 LAB — VITAMIN D 25 HYDROXY (VIT D DEFICIENCY, FRACTURES): Vit D, 25-Hydroxy: 45 ng/mL (ref 30.0–100.0)

## 2023-04-30 LAB — CBC WITH DIFFERENTIAL/PLATELET
Basophils Absolute: 0 10*3/uL (ref 0.0–0.2)
Basos: 0 %
EOS (ABSOLUTE): 0.1 10*3/uL (ref 0.0–0.4)
Eos: 2 %
Hematocrit: 41.7 % (ref 34.0–46.6)
Hemoglobin: 13.5 g/dL (ref 11.1–15.9)
Immature Grans (Abs): 0 10*3/uL (ref 0.0–0.1)
Immature Granulocytes: 0 %
Lymphocytes Absolute: 1.1 10*3/uL (ref 0.7–3.1)
MCH: 29.9 pg (ref 26.6–33.0)
MCHC: 32.4 g/dL (ref 31.5–35.7)
MCV: 92 fL (ref 79–97)
Monocytes: 10 %
Neutrophils Absolute: 3 10*3/uL (ref 1.4–7.0)
Neutrophils: 64 %
Platelets: 215 10*3/uL (ref 150–450)
RBC: 4.52 x10E6/uL (ref 3.77–5.28)
RDW: 12.7 % (ref 11.7–15.4)
WBC: 4.8 10*3/uL (ref 3.4–10.8)

## 2023-04-30 LAB — COMPREHENSIVE METABOLIC PANEL
Albumin: 4.7 g/dL (ref 3.9–4.9)
Alkaline Phosphatase: 52 IU/L (ref 44–121)
BUN/Creatinine Ratio: 16 (ref 12–28)
BUN: 11 mg/dL (ref 8–27)
Bilirubin Total: 0.3 mg/dL (ref 0.0–1.2)
CO2: 22 mmol/L (ref 20–29)
Calcium: 9.2 mg/dL (ref 8.7–10.3)
Chloride: 101 mmol/L (ref 96–106)
Creatinine, Ser: 0.69 mg/dL (ref 0.57–1.00)
Globulin, Total: 2 g/dL (ref 1.5–4.5)
Glucose: 90 mg/dL (ref 70–99)
Potassium: 4.2 mmol/L (ref 3.5–5.2)
Sodium: 138 mmol/L (ref 134–144)
eGFR: 93 mL/min/{1.73_m2} (ref >59)

## 2023-04-30 LAB — VITAMIN B12: Vitamin B-12: 939 pg/mL (ref 232–1245)

## 2023-04-30 LAB — LIPASE: Lipase: 56 U/L (ref 14–72)

## 2023-04-30 NOTE — Progress Notes (Signed)
Notify patient all labs are normal.  CBC, CMP, lipase, vitamin D, iron, and vitamin B12 are all normal.  No evidence of anemia.  Normal liver, pancreas, and kidney tests.  No evidence of vitamin deficiencies.  Continue Creon for pancreatic insufficiency.  Continue with plan for CT as scheduled.

## 2023-04-30 NOTE — Telephone Encounter (Signed)
Patient notified . Marland Kitchen    Notify patient all labs are normal.  CBC, CMP, lipase, vitamin D, iron, and vitamin B12 are all normal.  No evidence of anemia.  Normal liver, pancreas, and kidney tests.  No evidence of vitamin deficiencies.  Continue Creon for pancreatic insufficiency.  Continue with plan for CT as scheduled.

## 2023-04-30 NOTE — Telephone Encounter (Signed)
Left message to return call.   Notify patient all labs are normal.  CBC, CMP, lipase, vitamin D, iron, and vitamin B12 are all normal.  No evidence of anemia.  Normal liver, pancreas, and kidney tests.  No evidence of vitamin deficiencies.  Continue Creon for pancreatic insufficiency.  Continue with plan for CT as scheduled.

## 2023-05-01 ENCOUNTER — Ambulatory Visit
Admission: RE | Admit: 2023-05-01 | Discharge: 2023-05-01 | Disposition: A | Discharge status: Home / Self Care | Payer: Medicare Other | Source: Ambulatory Visit | Attending: Physician Assistant | Admitting: Physician Assistant

## 2023-05-01 DIAGNOSIS — R1012 Left upper quadrant pain: Secondary | ICD-10-CM | POA: Diagnosis present

## 2023-05-01 MED ORDER — IOHEXOL 300 MG/ML  SOLN
100.0000 mL | Freq: Once | INTRAMUSCULAR | Status: AC | PRN
  Administered 2023-05-01: 100 mL via INTRAVENOUS

## 2023-05-12 ENCOUNTER — Ambulatory Visit (INDEPENDENT_AMBULATORY_CARE_PROVIDER_SITE_OTHER): Payer: Medicare Other | Admitting: Dermatology

## 2023-05-12 VITALS — BP 143/82 | HR 67

## 2023-05-12 DIAGNOSIS — L820 Inflamed seborrheic keratosis: Secondary | ICD-10-CM | POA: Diagnosis not present

## 2023-05-12 DIAGNOSIS — W57XXXA Bitten or stung by nonvenomous insect and other nonvenomous arthropods, initial encounter: Secondary | ICD-10-CM

## 2023-05-12 DIAGNOSIS — D229 Melanocytic nevi, unspecified: Secondary | ICD-10-CM

## 2023-05-12 DIAGNOSIS — D1801 Hemangioma of skin and subcutaneous tissue: Secondary | ICD-10-CM

## 2023-05-12 DIAGNOSIS — L578 Other skin changes due to chronic exposure to nonionizing radiation: Secondary | ICD-10-CM | POA: Diagnosis not present

## 2023-05-12 DIAGNOSIS — Z1283 Encounter for screening for malignant neoplasm of skin: Secondary | ICD-10-CM | POA: Diagnosis not present

## 2023-05-12 DIAGNOSIS — L738 Other specified follicular disorders: Secondary | ICD-10-CM

## 2023-05-12 DIAGNOSIS — L821 Other seborrheic keratosis: Secondary | ICD-10-CM

## 2023-05-12 DIAGNOSIS — W908XXA Exposure to other nonionizing radiation, initial encounter: Secondary | ICD-10-CM | POA: Diagnosis not present

## 2023-05-12 DIAGNOSIS — S1086XA Insect bite of other specified part of neck, initial encounter: Secondary | ICD-10-CM

## 2023-05-12 DIAGNOSIS — L719 Rosacea, unspecified: Secondary | ICD-10-CM

## 2023-05-12 DIAGNOSIS — D692 Other nonthrombocytopenic purpura: Secondary | ICD-10-CM

## 2023-05-12 DIAGNOSIS — Z86018 Personal history of other benign neoplasm: Secondary | ICD-10-CM

## 2023-05-12 DIAGNOSIS — L814 Other melanin hyperpigmentation: Secondary | ICD-10-CM

## 2023-05-12 MED ORDER — METRONIDAZOLE 0.75 % EX CREA: TOPICAL_CREAM | CUTANEOUS | 11 refills | Status: DC

## 2023-05-12 NOTE — Progress Notes (Signed)
Follow-Up Visit   Subjective  Jane Riley is a 72 y.o. female who presents for the following: Skin Cancer Screening and Full Body Skin Exam  The patient presents for Total-Body Skin Exam (TBSE) for skin cancer screening and mole check. The patient has spots, moles and lesions to be evaluated, some may be new or changing and the patient may have concern these could be cancer.  Some get itchy and irritated.   The following portions of the chart were reviewed this encounter and updated as appropriate: medications, allergies, medical history  Review of Systems:  No other skin or systemic complaints except as noted in HPI or Assessment and Plan.  Objective  Well appearing patient in no apparent distress; mood and affect are within normal limits.  A full examination was performed including scalp, head, eyes, ears, nose, lips, neck, chest, axillae, abdomen, back, buttocks, bilateral upper extremities, bilateral lower extremities, hands, feet, fingers, toes, fingernails, and toenails. All findings within normal limits unless otherwise noted below.   Relevant physical exam findings are noted in the Assessment and Plan.  L neck x 1, L lower back x 1, R med elbow x 1 (2nd tx), L forearm x 1 (4) Erythematous stuck-on, waxy papule or plaque  Posterior Neck Pink papules.    Assessment & Plan   SKIN CANCER SCREENING PERFORMED TODAY.  ACTINIC DAMAGE - Chronic condition, secondary to cumulative UV/sun exposure - diffuse scaly erythematous macules with underlying dyspigmentation - Recommend daily broad spectrum sunscreen SPF 30+ to sun-exposed areas, reapply every 2 hours as needed.  - Staying in the shade or wearing long sleeves, sun glasses (UVA+UVB protection) and wide brim hats (4-inch brim around the entire circumference of the hat) are also recommended for sun protection.  - Call for new or changing lesions.  LENTIGINES, SEBORRHEIC KERATOSES, HEMANGIOMAS - Benign normal skin lesions -  Benign-appearing - Call for any changes  MELANOCYTIC NEVI - Tan-brown and/or pink-flesh-colored symmetric macules and papules - Benign appearing on exam today - Observation - Call clinic for new or changing moles - Recommend daily use of broad spectrum spf 30+ sunscreen to sun-exposed areas.   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 Condition is bothersome/symptomatic for patient. Currently flared.  Rosacea is a chronic progressive skin condition usually affecting the face of adults, causing redness and/or acne bumps. It is treatable but not curable. It sometimes affects the eyes (ocular rosacea) as well. It may respond to topical and/or systemic medication and can flare with stress, sun exposure, alcohol, exercise, topical steroids (including hydrocortisone/cortisone 10) and some foods.  Daily application of broad spectrum spf 30+ sunscreen to face is recommended to reduce flares.  Treatment Plan Start metronidazole 0.75% cream Apply to affected areas face once to twice daily dsp 45g 23yr Rf.  Purpura - Chronic; persistent and recurrent.  Treatable, but not curable. - Violaceous macules and patches - Benign - Related to trauma, age, sun damage and/or use of blood thinners, chronic use of topical and/or oral steroids - Observe - Can use OTC arnica containing moisturizer such as Dermend Bruise Formula if desired - Call for worsening or other concerns  Sebaceous Hyperplasia - Small yellow papules with a central dell - Benign-appearing - Observe. Call for changes.  Inflamed seborrheic keratosis (4) L neck x 1, L lower back x 1, R med  elbow x 1 (2nd tx), L forearm x 1  Symptomatic, irritating, patient would like treated.  Destruction of lesion - L neck x 1, L lower back x 1, R med elbow x 1 (2nd tx), L forearm  x 1 (4)  Destruction method: cryotherapy   Informed consent: discussed and consent obtained   Lesion destroyed using liquid nitrogen: Yes   Region frozen until ice ball extended beyond lesion: Yes   Outcome: patient tolerated procedure well with no complications   Post-procedure details: wound care instructions given   Additional details:  Prior to procedure, discussed risks of blister formation, small wound, skin dyspigmentation, or rare scar following cryotherapy. Recommend Vaseline ointment to treated areas while healing.   Bug bite without infection, initial encounter Posterior Neck  Observation.   HISTORY OF DYSPLASTIC NEVI No evidence of recurrence today Recommend regular full body skin exams Recommend daily broad spectrum sunscreen SPF 30+ to sun-exposed areas, reapply every 2 hours as needed.  Call if any new or changing lesions are noted between office visits  Return in about 1 year (around 05/11/2024) for TBSE, Hx Dysplastic Nevus.  ICherlyn Labella, CMA, am acting as scribe for Willeen Niece, MD .   Documentation: I have reviewed the above documentation for accuracy and completeness, and I agree with the above.  Willeen Niece, MD

## 2023-05-12 NOTE — Patient Instructions (Addendum)
Cryotherapy Aftercare  Wash gently with soap and water everyday.   Apply Vaseline and Band-Aid daily until healed.    Rosacea  What is rosacea? Rosacea (say: ro-zay-sha) is a common skin disease that usually begins as a trend of flushing or blushing easily.  As rosacea progresses, a persistent redness in the center of the face will develop and may gradually spread beyond the nose and cheeks to the forehead and chin.  In some cases, the ears, chest, and back could be affected.  Rosacea may appear as tiny blood vessels or small red bumps that occur in crops.  Frequently they can contain pus, and are called "pustules".  If the bumps do not contain pus, they are referred to as "papules".  Rarely, in prolonged, untreated cases of rosacea, the oil glands of the nose and cheeks may become permanently enlarged.  This is called rhinophyma, and is seen more frequently in men.  Signs and Risks In its beginning stages, rosacea tends to come and go, which makes it difficult to recognize.  It can start as intermittent flushing of the face.  Eventually, blood vessels may become permanently visible.  Pustules and papules can appear, but can be mistaken for adult acne.  People of all races, ages, genders and ethnic groups are at risk of developing rosacea.  However, it is more common in women (especially around menopause) and adults with fair skin between the ages of 67 and 41.  Treatment Dermatologists typically recommend a combination of treatments to effectively manage rosacea.  Treatment can improve symptoms and may stop the progression of the rosacea.  Treatment may involve both topical and oral medications.  The tetracycline antibiotics are often used for their anti-inflammatory effect; however, because of the possibility of developing antibiotic resistance, they should not be used long term at full dose.  For dilated blood vessels the options include electrodessication (uses electric current through a small  needle), laser treatment, and cosmetics to hide the redness.   With all forms of treatment, improvement is a slow process, and patients may not see any results for the first 3-4 weeks.  It is very important to avoid the sun and other triggers.  Patients must wear sunscreen daily.  Skin Care Instructions: Cleanse the skin with a mild soap such as CeraVe cleanser, Cetaphil cleanser, or Dove soap once or twice daily as needed. Moisturize with Eucerin Redness Relief Daily Perfecting Lotion (has a subtle green tint), CeraVe Moisturizing Cream, or Oil of Olay Daily Moisturizer with sunscreen every morning and/or night as recommended. Makeup should be "non-comedogenic" (won't clog pores) and be labeled "for sensitive skin". Good choices for cosmetics are: Neutrogena, Almay, and Physician's Formula.  Any product with a green tint tends to offset a red complexion. If your eyes are dry and irritated, use artificial tears 2-3 times per day and cleanse the eyelids daily with baby shampoo.  Have your eyes examined at least every 2 years.  Be sure to tell your eye doctor that you have rosacea. Alcoholic beverages tend to cause flushing of the skin, and may make rosacea worse. Always wear sunscreen, protect your skin from extreme hot and cold temperatures, and avoid spicy foods, hot drinks, and mechanical irritation such as rubbing, scrubbing, or massaging the face.  Avoid harsh skin cleansers, cleansing masks, astringents, and exfoliation. If a particular product burns or makes your face feel tight, then it is likely to flare your rosacea. If you are having difficulty finding a sunscreen that you can  tolerate, you may try switching to a chemical-free sunscreen.  These are ones whose active ingredient is zinc oxide or titanium dioxide only.  They should also be fragrance free, non-comedogenic, and labeled for sensitive skin. Rosacea triggers may vary from person to person.  There are a variety of foods that have been  reported to trigger rosacea.  Some patients find that keeping a diary of what they were doing when they flared helps them avoid triggers.    Melanoma ABCDEs  Melanoma is the most dangerous type of skin cancer, and is the leading cause of death from skin disease.  You are more likely to develop melanoma if you: Have light-colored skin, light-colored eyes, or red or blond hair Spend a lot of time in the sun Tan regularly, either outdoors or in a tanning bed Have had blistering sunburns, especially during childhood Have a close family member who has had a melanoma Have atypical moles or large birthmarks  Early detection of melanoma is key since treatment is typically straightforward and cure rates are extremely high if we catch it early.   The first sign of melanoma is often a change in a mole or a new dark spot.  The ABCDE system is a way of remembering the signs of melanoma.  A for asymmetry:  The two halves do not match. B for border:  The edges of the growth are irregular. C for color:  A mixture of colors are present instead of an even brown color. D for diameter:  Melanomas are usually (but not always) greater than 6mm - the size of a pencil eraser. E for evolution:  The spot keeps changing in size, shape, and color.  Please check your skin once per month between visits. You can use a small mirror in front and a large mirror behind you to keep an eye on the back side or your body.   If you see any new or changing lesions before your next follow-up, please call to schedule a visit.  Please continue daily skin protection including broad spectrum sunscreen SPF 30+ to sun-exposed areas, reapplying every 2 hours as needed when you're outdoors.   Staying in the shade or wearing long sleeves, sun glasses (UVA+UVB protection) and wide brim hats (4-inch brim around the entire circumference of the hat) are also recommended for sun protection.     Due to recent changes in healthcare laws,  you may see results of your pathology and/or laboratory studies on MyChart before the doctors have had a chance to review them. We understand that in some cases there may be results that are confusing or concerning to you. Please understand that not all results are received at the same time and often the doctors may need to interpret multiple results in order to provide you with the best plan of care or course of treatment. Therefore, we ask that you please give [place] 2 business days to thoroughly review all your results before contacting the office for clarification. Should we see a critical lab result, you will be contacted sooner.   If You Need Anything After Your Visit  If you have any questions or concerns for your doctor, please call our main line at [phone] and press option 4 to reach your doctor's medical assistant. If no one answers, please leave a voicemail as directed and we will return your call as soon as possible. Messages left after 4 pm will be answered the following business day.   You may also send  [place] a message via MyChart. We typically respond to MyChart messages within 1-2 business days.  For prescription refills, please ask your pharmacy to contact our office. Our fax number is [phone].  If you have an urgent issue when the clinic is closed that cannot wait until the next business day, you can page your doctor at the number below.    Please note that while we do our best to be available for urgent issues outside of office hours, we are not available 24/7.   If you have an urgent issue and are unable to reach [place], you may choose to seek medical care at your doctor's office, retail clinic, urgent care center, or emergency room.  If you have a medical emergency, please immediately call 911 or go to the emergency department.  Pager Numbers  - Dr. Gwen Pounds: [phone]  - Dr. Roseanne Reno: [phone]  In the event of inclement weather, please call our main line at  [phone] for an update on the status of any delays or closures.  Dermatology Medication Tips: Please keep the boxes that topical medications come in in order to help keep track of the instructions about where and how to use these. Pharmacies typically print the medication instructions only on the boxes and not directly on the medication tubes.   If your medication is too expensive, please contact our office at [phone] option 4 or send [place] a message through MyChart.   We are unable to tell what your co-pay for medications will be in advance as this is different depending on your insurance coverage. However, we may be able to find a substitute medication at lower cost or fill out paperwork to get insurance to cover a needed medication.   If a prior authorization is required to get your medication covered by your insurance company, please allow [place] 1-2 business days to complete this process.  Drug prices often vary depending on where the prescription is filled and some pharmacies may offer cheaper prices.  The website www.goodrx.com contains coupons for medications through different pharmacies. The prices here do not account for what the cost may be with help from insurance (it may be cheaper with your insurance), but the website can give you the price if you did not use any insurance.  - You can print the associated coupon and take it with your prescription to the pharmacy.  - You may also stop by our office during regular business hours and pick up a GoodRx coupon card.  - If you need your prescription sent electronically to a different pharmacy, notify our office through [facility] or by phone at [phone] option 4.     Si Usted Necesita Algo Despus de Su Visita  Tambin puede enviarnos un mensaje a travs de Clinical cytogeneticist. Por lo general respondemos a los mensajes de MyChart en el transcurso de 1 a 2 das hbiles.  Para renovar recetas, por favor pida a su farmacia que se  ponga en contacto con nuestra oficina. Annie Sable de fax es [place] [phone].  Si tiene un asunto urgente cuando la clnica est cerrada y que no puede esperar hasta el siguiente da hbil, puede llamar/localizar a su doctor(a) al nmero que aparece a continuacin.   Por favor, tenga en cuenta que aunque hacemos todo lo posible para estar disponibles para asuntos urgentes fuera del horario de [place], no estamos disponibles las 24 horas del da, los 7 [address]  [address] de la [place].   Si tiene un problema urgente y no puede  comunicarse con nosotros, puede optar por buscar atencin mdica  en el consultorio de su doctor(a), en una clnica privada, en un centro de atencin urgente o en una sala de emergencias.  Si tiene Engineer, drilling, por favor llame inmediatamente al 911 o vaya a la sala de emergencias.  Nmeros de bper  - Dr. Gwen Pounds: [phone]  - Dra. Roseanne Reno: [phone]  En caso de inclemencias del [place], por favor llame a Lacy Duverney principal al [phone] para una actualizacin sobre el [place] de cualquier retraso o cierre.  Consejos para la medicacin en dermatologa: Por favor, guarde las cajas en las que vienen los medicamentos de uso tpico para ayudarle a seguir las instrucciones sobre dnde y cmo usarlos. Las farmacias generalmente imprimen las instrucciones del medicamento slo en las cajas y no directamente en los tubos del [place].   Si su medicamento es muy caro, por favor, pngase en contacto con Rolm Gala llamando al [phone] y presione la opcin 4 o envenos un mensaje a travs de Clinical cytogeneticist.   No podemos decirle cul ser su copago por los medicamentos por adelantado ya que esto es diferente dependiendo de la cobertura de su seguro. Sin embargo, es posible que podamos encontrar un medicamento sustituto a Audiological scientist un formulario para que el seguro cubra el medicamento que se considera necesario.   Si se requiere una autorizacin previa para  que su compaa de seguros [place] su medicamento, por favor permtanos de 1 a 2 das hbiles para completar [address]  Los precios de los medicamentos varan con frecuencia dependiendo del Environmental consultant de dnde se surte la receta y alguna farmacias pueden ofrecer precios ms baratos.  El sitio web www.goodrx.com tiene cupones para medicamentos de Health and safety inspector. Los precios aqu no tienen en cuenta lo que podra costar con la ayuda del seguro (puede ser ms barato con su seguro), pero el sitio web puede darle el precio si no utiliz Tourist information centre manager.  - Puede imprimir el cupn correspondiente y llevarlo con su receta a la farmacia.  - Tambin puede pasar por nuestra oficina durante el horario de atencin regular y Education officer, museum una tarjeta de cupones de GoodRx.  - Si necesita que su receta se enve electrnicamente a una farmacia diferente, informe a nuestra oficina a travs de MyChart de [place] o por telfono llamando al [phone] y presione la opcin 4.

## 2023-06-24 ENCOUNTER — Other Ambulatory Visit: Payer: Self-pay

## 2023-06-29 NOTE — Progress Notes (Unsigned)
Celso Amy, PA-C [address]  [address]  [place], [place] [address]  Main: [phone]  Fax: [phone]   Primary Care Physician: Marisue Ivan, MD  Primary Gastroenterologist:  Celso Amy, PA-C / Dr. Lannette Donath    CC: F/U Epigastric and LUQ Pain, EPI, IBS-D, Chronic Diarrhea  HPI: Jane Riley is a 72 y.o. female returns for 78-month follow-up of LUQ pain, epigastric pain, and chronic diarrhea.  Has history of severe pancreatic insufficiency, currently on Creon 36,000 lipase units 2 with each meal along with each snack.  Also history of IBS-D and chronic GERD on long-term PPI omeprazole.  Abdominal pelvic CT with contrast 05/01/2023 showed no acute abnormality to explain abdominal pain.  Incidental diverticulosis but no diverticulitis.  Labs 04/29/2023 showed NORMAL: CBC, CMP, lipase, vitamin D, vitamin B12, and iron.  Current Symptoms: She is currently feeling a lot better.  Episodes of abdominal pain are a lot less frequent.  Diarrhea is also less frequent.  She has occasional episode of loose stools first thing in the morning.  Overall feeling better than 2 months ago.  GI History: -Previous Cholecystectomy & Hysterectomy. -Pancreatic fecal elastase very low = 90. - EGD / Colon 05/2020 Unremarkable.  NO H. Pylori or Microscopic Colitis. -Abd / Pelvic CT 08/2021 normal pancreas. -Previous negative celiac labs and negative GI pathogen panel. -Tried rifaximin for possible SIBO with little benefit. -Food allergy panel showed allergy to egg whites.  Alpha gal panel negative. -Serum gastrin, somatostatin, VIP, calcitonin levels as well as 24-hour urine 5 HIAA levels.  Current Outpatient Medications  Medication Sig Dispense Refill   albuterol (VENTOLIN HFA) 108 (90 Base) MCG/ACT inhaler Inhale 2 puffs into the lungs every 6 (six) hours as needed for wheezing or shortness of breath. 8 g 2   Biotin 5000 MCG TABS Take 5,000 mcg by mouth daily.      busPIRone (BUSPAR)  5 MG tablet      carboxymethylcellulose (REFRESH TEARS) 0.5 % SOLN Apply to eye.     Cholecalciferol (VITAMIN D3) 1000 units CAPS Take 1,000 Units by mouth daily.      citalopram (CELEXA) 20 MG tablet Take 20 mg by mouth daily.      Coenzyme Q10 (COQ10) 200 MG CAPS Take 200 mg by mouth daily.      levocetirizine (XYZAL) 5 MG tablet Take 1 tablet (5 mg total) by mouth every evening. 30 tablet 0   lipase/protease/amylase (CREON) 36000 UNITS CPEP capsule Take 3 capsule with the first bite of each meal and 1 capsule with the first bite of each snack. 990 capsule 3   lisinopril-hydrochlorothiazide (ZESTORETIC) 20-12.5 MG tablet Take 1 tablet by mouth daily.     lovastatin (MEVACOR) 40 MG tablet Take by mouth.     Magnesium Oxide 500 MG TABS Take 500 mg by mouth daily with breakfast.      metroNIDAZOLE (METROCREAM) 0.75 % cream Apply to affected areas once to twice daily for rosacea. 45 g 11   nystatin cream (MYCOSTATIN) Apply 1 Application topically 2 (two) times daily. 30 g 0   Omega-3 Fatty Acids (KP FISH OIL) 1200 MG CAPS Take 1,200 mg by mouth daily.      omeprazole (PRILOSEC) 20 MG capsule Take 1 capsule (20 mg total) by mouth daily. 30 capsule 0   raloxifene (EVISTA) 60 MG tablet Take 60 mg by mouth daily.     vitamin E 400 UNIT capsule Take 400 Units by mouth daily.  No current facility-administered medications for this visit.    Allergies as of 06/30/2023 - Review Complete 05/12/2023  Allergen Reaction Noted   Codeine Nausea And Vomiting and Nausea Only 02/20/2014   Doxycycline  03/10/2018   Adhesive [tape] Other (See Comments) 01/05/2017   Amlodipine Swelling 05/21/2018   Amoxicillin Rash 11/29/2014   Nsaids Other (See Comments) 02/20/2014   Other Itching 02/20/2014   Oxycodone Itching 05/21/2018   Pravastatin Other (See Comments) 02/20/2014    Past Medical History:  Diagnosis Date   Arthritis    neck, hands   CRPS (complex regional pain syndrome)    Depression     Dysplastic nevus 12/07/2007   left post shoulder. Severe atypia. Excised: 01/17/2008, no residual neoplasm   Dysplastic nevus 12/22/2017   left buttock. Moderate atypia, limited margins free   Dysplastic nevus 05/06/2021   left shoulder. Moderate to severe. Shave removal  06/03/21   Family history of adverse reaction to anesthesia    mother nausea and vomiting   GERD (gastroesophageal reflux disease)    Hypercholesteremia    Hypertension    Osteoporosis    PONV (postoperative nausea and vomiting)     Past Surgical History:  Procedure Laterality Date   ABDOMINAL HYSTERECTOMY     CATARACT EXTRACTION W/PHACO Right 01/12/2017   Procedure: CATARACT EXTRACTION PHACO AND INTRAOCULAR LENS PLACEMENT (IOC) Right Restor Lens;  Surgeon: Sherald Hess, MD;  [facility];  Service: Ophthalmology;  Laterality: Right;  Restor Lens   CESAREAN SECTION     x2   CHOLECYSTECTOMY     COLONOSCOPY WITH PROPOFOL N/A 05/10/2020   Procedure: COLONOSCOPY WITH PROPOFOL;  Surgeon: Pasty Spillers, MD;  Location: [facility];  Service: Endoscopy;  Laterality: N/A;   ESOPHAGOGASTRODUODENOSCOPY (EGD) WITH PROPOFOL N/A 03/24/2018   Procedure: ESOPHAGOGASTRODUODENOSCOPY (EGD) WITH PROPOFOL;  Surgeon: Pasty Spillers, MD;  Location: [facility];  Service: Endoscopy;  Laterality: N/A;   ESOPHAGOGASTRODUODENOSCOPY (EGD) WITH PROPOFOL N/A 05/10/2020   Procedure: ESOPHAGOGASTRODUODENOSCOPY (EGD) WITH PROPOFOL;  Surgeon: Pasty Spillers, MD;  Location: [facility];  Service: Endoscopy;  Laterality: N/A;   FLEXIBLE BRONCHOSCOPY N/A 05/27/2018   Procedure: FLEXIBLE BRONCHOSCOPY;  Surgeon: Shane Crutch, MD;  Location: [facility];  Service: Pulmonary;  Laterality: N/A;   NECK SURGERY  2008   4 level fusion.  plates and screws   TONSILLECTOMY     WRIST SURGERY Left 2014   implant but unsure of the type    Review of Systems:    All systems reviewed and negative except where  noted in HPI.   Physical Examination:   There were no vitals taken for this visit.  General: Well-nourished, well-developed in no acute distress.  Neuro: Alert and oriented x 3.  Grossly intact.  Psych: Alert and cooperative, normal mood and affect. No exam today.  Imaging Studies: See HPI.  Assessment and Plan:   Jane Riley is a 72 y.o. y/o female returns for follow-up of:  1.  Exocrine pancreatic insufficiency  Continue Creon 36,000 lipase units 2 with each meal and 1 with each snack.  2.  Chronic diarrhea   Likely due to EPI and IBS-D.  Bile salt diarrhea postcholecystectomy is also in the differential.  Trial of OTC liquid Imodium (loperamide), 1 to 2 tablespoons before bedtime to try to prevent morning diarrhea.  Patient declined Colestid or cholestyramine because she does not want to have constipation.  3.  Irritable bowel syndrome, diarrhea predominant  Low FODMAP diet  4.  Epigastric and LUQ pain - Recent normal labs and abdominal CT.  Symptoms improved.  Reassurance.  5.  Chronic GERD  Continue omeprazole    Celso Amy, PA-C  Follow up as needed if she has recurrent GI symptoms.

## 2023-06-30 ENCOUNTER — Encounter: Payer: Self-pay | Admitting: Physician Assistant

## 2023-06-30 ENCOUNTER — Ambulatory Visit: Payer: Medicare Other | Admitting: Physician Assistant

## 2023-06-30 VITALS — BP 159/82 | HR 68 | Temp 98.1°F | Ht 64.0 in | Wt 154.0 lb

## 2023-06-30 DIAGNOSIS — K580 Irritable bowel syndrome with diarrhea: Secondary | ICD-10-CM

## 2023-06-30 DIAGNOSIS — R1013 Epigastric pain: Secondary | ICD-10-CM

## 2023-06-30 DIAGNOSIS — K8681 Exocrine pancreatic insufficiency: Secondary | ICD-10-CM

## 2023-06-30 DIAGNOSIS — K8689 Other specified diseases of pancreas: Secondary | ICD-10-CM

## 2023-06-30 DIAGNOSIS — R1012 Left upper quadrant pain: Secondary | ICD-10-CM

## 2023-06-30 DIAGNOSIS — K219 Gastro-esophageal reflux disease without esophagitis: Secondary | ICD-10-CM

## 2023-06-30 NOTE — Patient Instructions (Signed)
You can try taking Liquid Imodium (Loperamide) 1 - 2 teaspoons or tablespoons before bedtime to try to help with Morning Diarrhea.

## 2023-07-28 ENCOUNTER — Encounter: Payer: Self-pay | Admitting: Gastroenterology

## 2023-07-28 DIAGNOSIS — K8689 Other specified diseases of pancreas: Secondary | ICD-10-CM

## 2023-07-28 MED ORDER — PANCRELIPASE (LIP-PROT-AMYL) 36000-114000 UNITS PO CPEP: ORAL_CAPSULE | ORAL | 3 refills | Status: AC

## 2023-09-02 IMAGING — CT CT ABD-PELV W/ CM
2 of 5 series · 15 of 46 positions shown, 17 images · IV contrast (omnipaque)
Comparison: CT chest 09/15/2019; U/S abdomen 10/08/2018.

CLINICAL DATA: Pancreatic insufficiency. Patient reports for 3
years immediately after eating she will have to run to the restroom
to have a bowel movement and notes abdominal discomfort.

EXAM:
CT ABDOMEN AND PELVIS WITH CONTRAST
TECHNIQUE: Multidetector CT imaging of the abdomen and pelvis was performed
using the standard protocol following bolus administration of
intravenous contrast.
CONTRAST:  100mL OMNIPAQUE IOHEXOL 300 MG/ML  SOLN

[Series 2: abd pelvis 5.00 · axial · 0.65mm/px · z∈[-1541,-1146]mm · 12 of 89 slices shown, 14 images]
[im 5/89  soft-tissue]
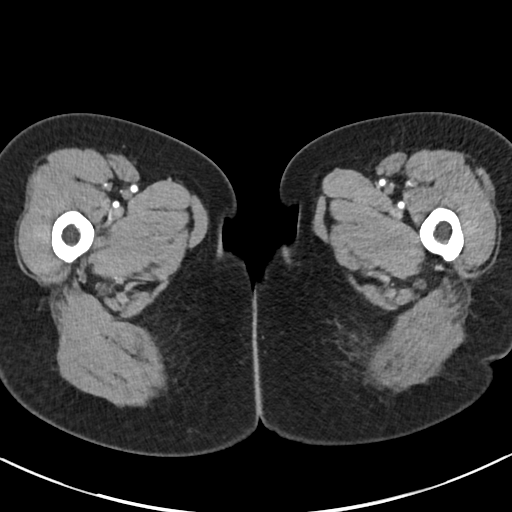
[im 5/89  bone]
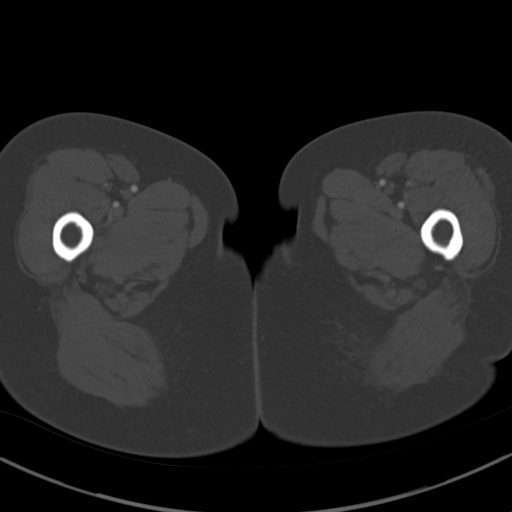
[im 14/89  soft-tissue]
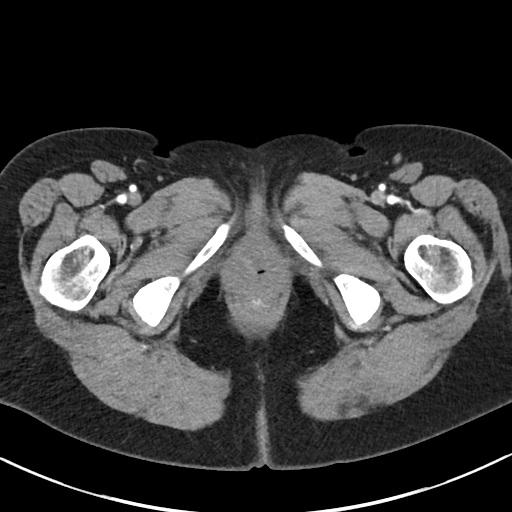
[im 19/89  soft-tissue]
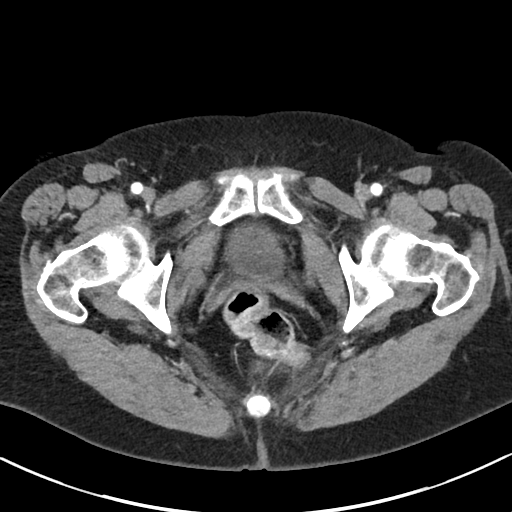
[im 28/89  soft-tissue]
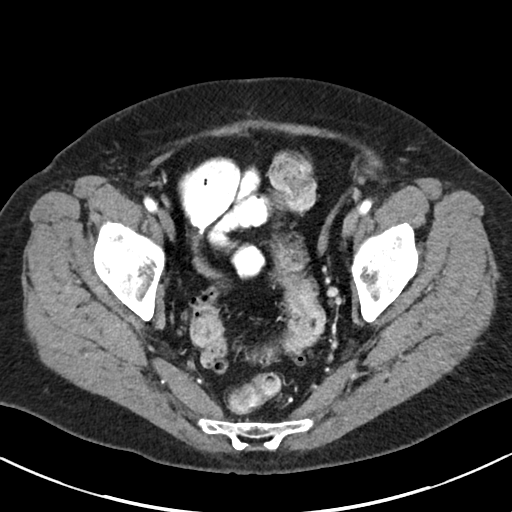
[im 33/89  soft-tissue]
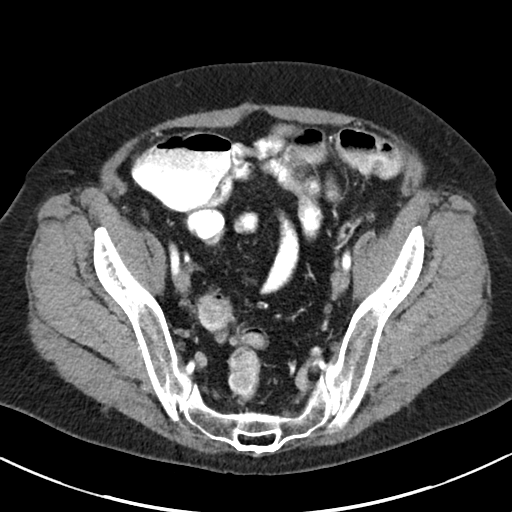
[im 42/89  soft-tissue]
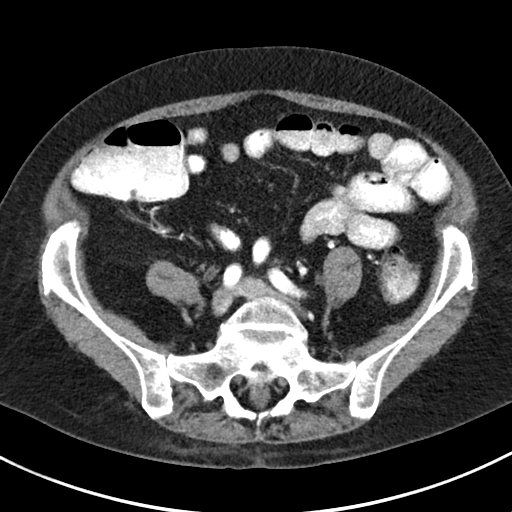
[im 47/89  soft-tissue]
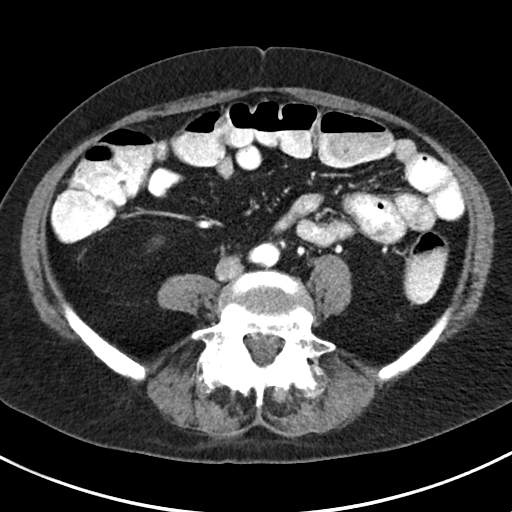
[im 56/89  soft-tissue]
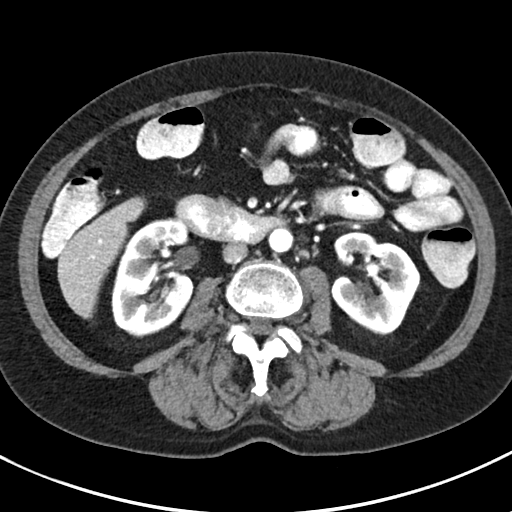
[im 61/89  soft-tissue]
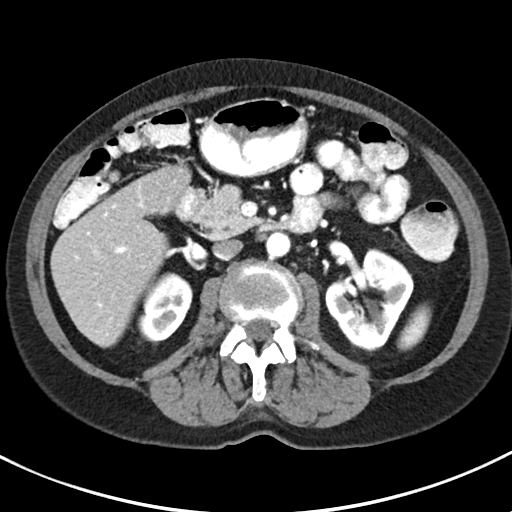
[im 61/89  bone]
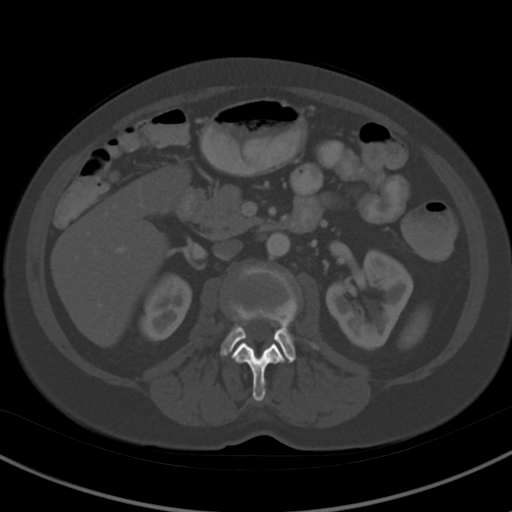
[im 70/89  soft-tissue]
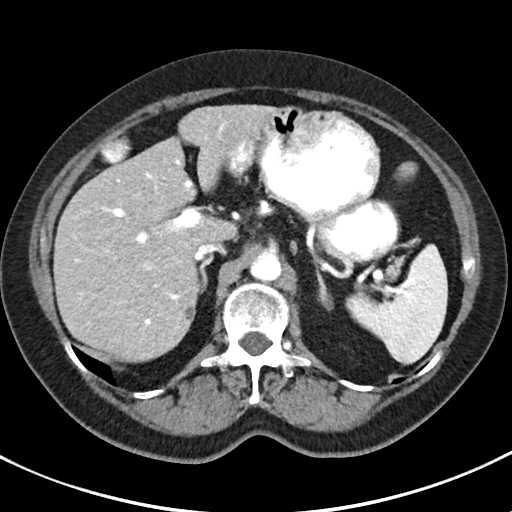
[im 75/89  soft-tissue]
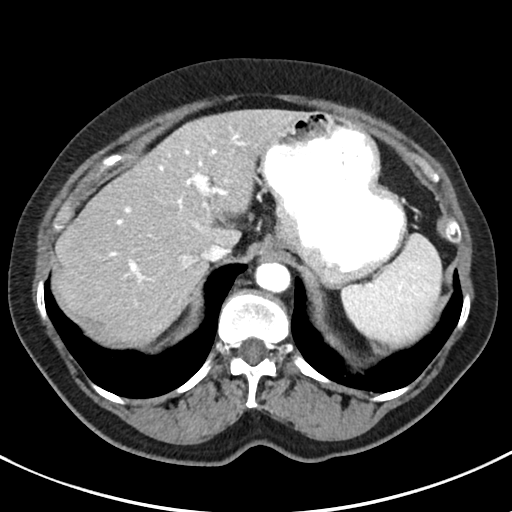
[im 84/89  soft-tissue]
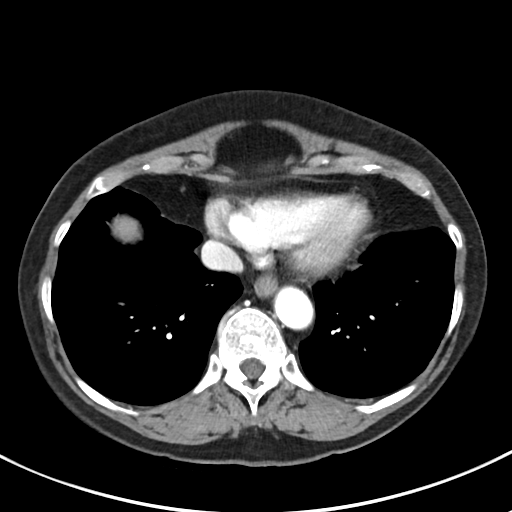

[Series 4: coronals abd pelvis 2.00 cor · coronal · 0.65mm/px · 3 of 139 slices shown]
[im 47/139  soft-tissue]
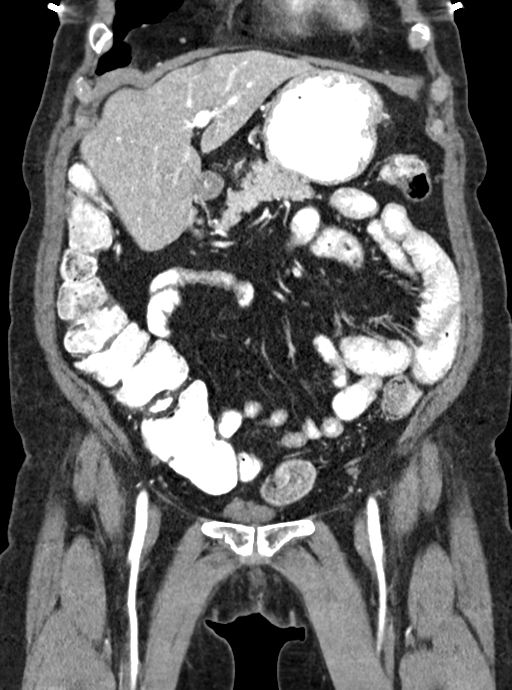
[im 62/139  soft-tissue]
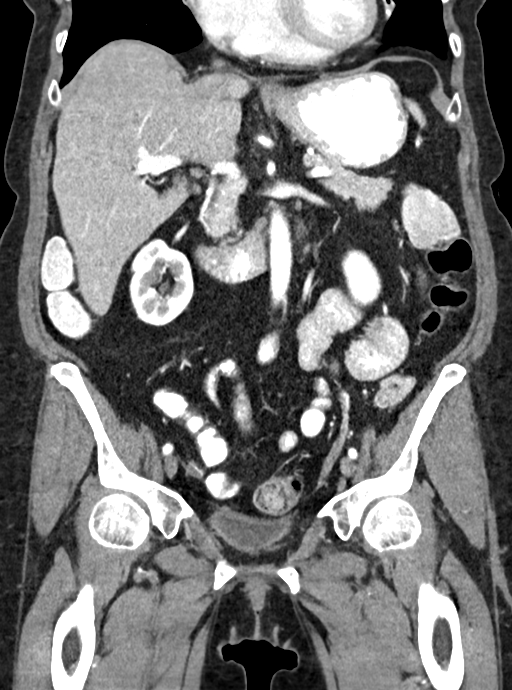
[im 77/139  soft-tissue]
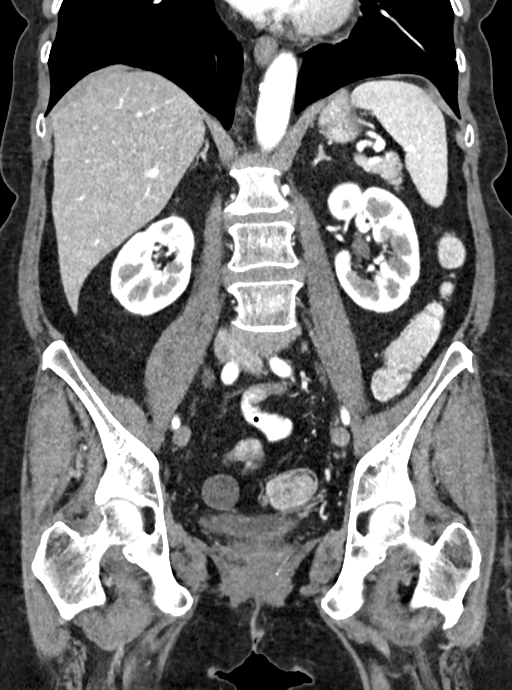

[15 of 46 positions shown; findings below may reference images not displayed]

FINDINGS: Lower chest: No acute findings. There are multiple small solid and
ground-glass pulmonary nodules in the lung bases. This is a chronic
abnormality and appears unchanged compared with 09/07/2018
consistent with a benign process.

Hepatobiliary: Benign cyst within dome of liver is identified
measuring 1.3 cm. A 6 mm subcapsular lesion along the medial margin
of right hepatic lobe is unchanged from 7516 compatible with a
benign lesion. No suspicious liver lesions. Status post
cholecystectomy. No bile duct dilatation.

Pancreas: Unremarkable. No pancreatic ductal dilatation or
surrounding inflammatory changes.

Spleen: Normal in size without focal abnormality.

Adrenals/Urinary Tract: Normal adrenal glands. No kidney mass or
hydronephrosis. Urinary bladder appears normal.

Stomach/Bowel: Stomach is normal. The appendix is visualized and is
within normal limits. Sigmoid diverticulosis noted without signs of
acute inflammation. No bowel wall thickening, inflammation, or
distension.

Vascular/Lymphatic: Aortic atherosclerosis. No abdominopelvic
adenopathy.

Reproductive: Status post hysterectomy. Simple appearing cyst within
the right adnexa measures 2.3 cm, image 64/2.

Other: No abdominal wall hernia or abnormality. No abdominopelvic
ascites.

Musculoskeletal: No acute or suspicious osseous findings. Mild
anterolisthesis of L5 on S1 measures 4 mm.
IMPRESSION: 1. No acute findings within the abdomen or pelvis.
2. Normal appearance of the pancreas.
3. Simple appearing cyst within the right adnexa measures 2.3 cm. No
follow-up imaging recommended. Note: This recommendation does not
apply to premenarchal patients and to those with increased risk
(genetic, family history, elevated tumor markers or other high-risk
factors) of ovarian cancer. Reference: JACR [DATE]):[id number]
4. Aortic atherosclerosis ([id number]).

## 2023-09-09 ENCOUNTER — Telehealth: Payer: Self-pay

## 2023-09-09 NOTE — Telephone Encounter (Signed)
Patient states she filled out one that they had sent to her but she will come by and sign her portion also at our office. I went on and fax it with out the signature but will fax again when she comes and signs.

## 2023-09-09 NOTE — Telephone Encounter (Signed)
Patient came and sign her portion and I faxed it to the patient assistance company

## 2023-09-09 NOTE — Telephone Encounter (Signed)
Patient creon patient assistance is expiring 10/06/2023. Filled out new paper work for 2025 to get her approved. Patient will need to come by our office to sign her portion of the paper work before we can fax it.

## 2023-09-21 NOTE — Telephone Encounter (Signed)
Called to check on status and she states they were missing the doctor portion and she see they have received it now she will send it to processing. She states a decision should be made in the next 5 to 7 business days.

## 2023-10-09 NOTE — Telephone Encounter (Signed)
 Has been approved through 10/05/2024

## 2023-11-19 ENCOUNTER — Ambulatory Visit: Payer: Medicare Other | Admitting: Internal Medicine

## 2023-12-03 ENCOUNTER — Ambulatory Visit: Payer: Medicare Other | Admitting: Internal Medicine

## 2023-12-14 ENCOUNTER — Encounter: Payer: Self-pay | Admitting: Internal Medicine

## 2023-12-14 ENCOUNTER — Ambulatory Visit: Payer: Medicare Other | Admitting: Internal Medicine

## 2023-12-14 VITALS — BP 136/80 | HR 66 | Temp 97.6°F | Ht 64.0 in | Wt 157.0 lb

## 2023-12-14 DIAGNOSIS — Z87891 Personal history of nicotine dependence: Secondary | ICD-10-CM

## 2023-12-14 DIAGNOSIS — R053 Chronic cough: Secondary | ICD-10-CM

## 2023-12-14 DIAGNOSIS — J208 Acute bronchitis due to other specified organisms: Secondary | ICD-10-CM | POA: Diagnosis not present

## 2023-12-14 NOTE — Progress Notes (Signed)
 [facility] Pulmonary Medicine Consultation     Date: 12/14/2023  MRN# [id number] Jane Riley 1951-01-25   SYNOPSIS The patient is a 73 year old female with a cough which started in the first week of June, but has been present on and off for about 3 years. At last visit was taken off ace and switched to Norvasc but stopped due to leg swelling. She was started on Arnuity sample which did not help. She has been restarted on lisinopril after a trial off of it which made no difference.   She underwent bronchoscopy on 05/27/18, this showed changes of Tracheobronchopathia osteochondroplastica, chronic bronchitis. Cultures, AFB were negative.   She has a dog, which sleeps in the bed.  She was tested for allergies, she was allergic to several things, but not the dog. She has completed allergy shots.  She does not snore at night, has never been tested for OSA, she is not sleepy during the day.     **CT chest 09/07/2018>> images personally reviewed, in comparison with previous on 03/19/2018 there remain tiny bilateral nodules, largest of which is about 6 mm, not significantly changed from previous. **CT chest 03/19/2018>> images personally reviewed, there are tiny bilateral basal nodules, largest of which is 7 mm. **Absolute eosinophil count 05/28/2017>> 100  **PFT 11/23/2016>> tracings personally reviewed, consistent with normal pulmonary functions. 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  Clinical Correlation Advised   Based  on above findings of pulmonary function testing approximately 1 year ago and the change in the PFTs most likely has some underlying reactive airways disease with bronchospasms of  small obstructive airways disease with hyperinflation and air trapping  CC  Follow-up reactive airways disease   HPI Acute viral bronchitis over the last 3 weeks 1 round of antibiotics and prednisone We will plan to use DayQuil and NyQuil as needed Use albuterol as needed Recurrent bouts of acute bronchitis  Chronic cough for many years Significant secondhand smoke exposure Quit tobacco 18 years ago    She does have a dog at home she denies having allergies to dogs or foods She is sensitive to bleaches and some perfumes  I reassured the patient that bilateral pulmonary nodules subcentimeter in size does not cause chronic bronchitis    No evidence of heart failure at this time No evidence or signs of infection at this time No respiratory distress No fevers, chills, nausea, vomiting, diarrhea No evidence of lower extremity edema No evidence hemoptysis    Medication:    Current Outpatient Medications:    albuterol (VENTOLIN HFA) 108 (90 Base) MCG/ACT inhaler, Inhale 2 puffs into the lungs every 6 (six) hours as needed for wheezing or shortness of breath., Disp: 8 g, Rfl: 2   Biotin 5000 MCG TABS, Take 5,000 mcg by mouth daily. , Disp: , Rfl:    busPIRone (BUSPAR) 5 MG tablet, , Disp: , Rfl:    carboxymethylcellulose (REFRESH TEARS) 0.5 % SOLN, Apply to eye., Disp: , Rfl:    Cholecalciferol (VITAMIN D3) 1000 units CAPS, Take 1,000 Units  by mouth daily. , Disp: , Rfl:    citalopram (CELEXA) 20 MG tablet, Take 20 mg by mouth daily. , Disp: , Rfl:    Coenzyme Q10 (COQ10) 200 MG CAPS, Take 200 mg by mouth daily. , Disp: , Rfl:    levocetirizine (XYZAL) 5 MG tablet, Take 1 tablet (5 mg total) by mouth every evening., Disp: 30 tablet, Rfl: 0   lipase/protease/amylase (CREON) 36000 UNITS CPEP capsule,  Take 2 capsule with the first bite of each meal and 1 capsule with the first bite of each snack., Disp: 240 capsule, Rfl: 3   lisinopril-hydrochlorothiazide (ZESTORETIC) 20-12.5 MG tablet, Take 1 tablet by mouth daily., Disp: , Rfl:    lovastatin (MEVACOR) 40 MG tablet, Take by mouth., Disp: , Rfl:    Magnesium Oxide 500 MG TABS, Take 500 mg by mouth daily with breakfast. , Disp: , Rfl:    metroNIDAZOLE (METROCREAM) 0.75 % cream, Apply to affected areas once to twice daily for rosacea., Disp: 45 g, Rfl: 11   nystatin cream (MYCOSTATIN), Apply 1 Application topically 2 (two) times daily., Disp: 30 g, Rfl: 0   Omega-3 Fatty Acids (KP FISH OIL) 1200 MG CAPS, Take 1,200 mg by mouth daily. , Disp: , Rfl:    omeprazole (PRILOSEC) 20 MG capsule, Take 1 capsule (20 mg total) by mouth daily., Disp: 30 capsule, Rfl: 0   raloxifene (EVISTA) 60 MG tablet, Take 60 mg by mouth daily., Disp: , Rfl:    vitamin E 400 UNIT capsule, Take 400 Units by mouth daily., Disp: , Rfl:    Allergies:  Codeine, Doxycycline, Adhesive [tape], Amlodipine, Amoxicillin, Nsaids, Other, Oxycodone, and Pravastatin    BP 136/80 (BP Location: Left Arm, Patient Position: Sitting, Cuff Size: Normal)   Pulse 66   Temp 97.6 F (36.4 C) (Temporal)   Ht 5\' 4"  (1.626 m)   Wt 157 lb (71.2 kg)   SpO2 97%   BMI 26.95 kg/m    Review of Systems: Gen:  Denies  fever, sweats, chills weight loss  HEENT: Denies blurred vision, double vision, ear pain, eye pain, hearing loss, nose bleeds, sore throat Cardiac:  No dizziness, chest pain or heaviness, chest tightness,edema, No JVD Resp:   +cough, -sputum production, +shortness of breath,+wheezing, -hemoptysis,  Other:  All other systems negative   Physical Examination:   General Appearance: No distress  EYES PERRLA, EOM intact.   NECK Supple, No JVD Pulmonary: normal breath sounds, No wheezing.  CardiovascularNormal S1,S2.  No m/r/g.   Abdomen: Benign, Soft, non-tender. 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  Symptoms most likely related to chronic bronchitis related to multiple allergies with upper airway cough syndrome with acute viral bronchitis at this time with post tussive symptoms  Acute viral bronchitis Incentive spirometry 10-15 times per day Flutter valve 10-15 times per day Albuterol as needed DayQuil during the day NyQuil at night Monitor blood pressure   Tiny lung nodules largest measuring 6 mm Very low risk for lung cancer Unchanged over the last several years CT of the chest reviewed in detail with the patient patient has CT scan of her abdomen in November 2022 and previous CT chest was December 2020 Findings suggest benign findings in the left lower lobe nodules pleural-based   MEDICATION  ADJUSTMENTS/LABS AND TESTS ORDERED: DayQuil and NyQuil as needed Incentive spirometry 10-15 times per day(breathing in exercise) Flutter valve 10-15 times per day(breathing out exercise) COUGH-will prescribe prednisone 10 mg daily for 5 days Albuterol 2 puffs as needed every 6 hrs Avoid secondhand smoke Avoid SICK contacts Recommend  Masking  when appropriate Recommend Keep up-to-date with vaccinations  CURRENT MEDICATIONS REVIEWED AT LENGTH WITH PATIENT TODAY   Patient satisfied with Plan of action and management. All questions answered   Follow-up in 6 months  Total time spent 42 minutes   Lucie Leather, M.D.  [facility]  Medical Director [facility] Medical Director [facility] Cardio-Pulmonary Department

## 2023-12-14 NOTE — Patient Instructions (Signed)
 Recommend DayQuil during the day Recommend NyQuil at night  Continue to use Ventolin as needed  Avoid Allergens and Irritants Avoid secondhand smoke Avoid SICK contacts Recommend  Masking  when appropriate Recommend Keep up-to-date with vaccinations   If symptoms get worse we will prescribe another round of prednisone and antibiotics

## 2023-12-18 ENCOUNTER — Encounter: Payer: Self-pay | Admitting: Internal Medicine

## 2023-12-21 ENCOUNTER — Other Ambulatory Visit: Payer: Self-pay | Admitting: Internal Medicine

## 2023-12-21 DIAGNOSIS — J4540 Moderate persistent asthma, uncomplicated: Secondary | ICD-10-CM

## 2023-12-21 MED ORDER — PREDNISONE 10 MG PO TABS: 10.0000 mg | ORAL_TABLET | Freq: Every day | ORAL | 0 refills | Status: DC

## 2023-12-21 MED ORDER — ALBUTEROL SULFATE HFA 108 (90 BASE) MCG/ACT IN AERS: 2.0000 | INHALATION_SPRAY | Freq: Four times a day (QID) | RESPIRATORY_TRACT | 10 refills | Status: DC | PRN

## 2023-12-21 NOTE — Telephone Encounter (Signed)
 Prednisone was not sent in. I have sent in the prescription per Dr. Clovis Fredrickson last note.  Nothing further needed.

## 2023-12-21 NOTE — Progress Notes (Signed)
 Refilled albuterol

## 2024-03-15 ENCOUNTER — Other Ambulatory Visit: Payer: Self-pay | Admitting: Family Medicine

## 2024-03-15 DIAGNOSIS — Z1231 Encounter for screening mammogram for malignant neoplasm of breast: Secondary | ICD-10-CM

## 2024-04-11 ENCOUNTER — Ambulatory Visit: Admitting: Dermatology

## 2024-04-11 DIAGNOSIS — B079 Viral wart, unspecified: Secondary | ICD-10-CM

## 2024-04-11 DIAGNOSIS — L304 Erythema intertrigo: Secondary | ICD-10-CM | POA: Diagnosis not present

## 2024-04-11 DIAGNOSIS — L820 Inflamed seborrheic keratosis: Secondary | ICD-10-CM | POA: Diagnosis not present

## 2024-04-11 MED ORDER — KETOCONAZOLE 2 % EX CREA: 1.0000 | TOPICAL_CREAM | Freq: Two times a day (BID) | CUTANEOUS | 6 refills | Status: AC

## 2024-04-11 MED ORDER — HYDROCORTISONE 2.5 % EX CREA: TOPICAL_CREAM | Freq: Two times a day (BID) | CUTANEOUS | 4 refills | Status: DC | PRN

## 2024-04-11 NOTE — Progress Notes (Signed)
 Follow-Up Visit   Subjective  Jane Riley is a 73 y.o. female who presents for the following: rash comes and goes ~3wks, under L breast, itchy and stings, used mupirocin  oint didn't help,  check irritated spots R inframammary, L back, L index finger, one on index finger sore   The following portions of the chart were reviewed this encounter and updated as appropriate: medications, allergies, medical history  Review of Systems:  No other skin or systemic complaints except as noted in HPI or Assessment and Plan.  Objective  Well appearing patient in no apparent distress; mood and affect are within normal limits.   A focused examination was performed of the following areas: inframmary  Relevant exam findings are noted in the Assessment and Plan.  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 Condition is symptomatic / bothersome to patient. Not to goal.   Intertrigo is a chronic recurrent rash that occurs in skin fold areas that may be associated with friction; heat; moisture; yeast; fungus; and bacteria.  It is exacerbated by increased movement / activity; sweating; and higher atmospheric temperature.  Use of an absorbant powder such as Zeasorb AF powder or other OTC antifungal powder to the area daily can prevent rash recurrence. Other options to help keep the area dry include blow drying the area after bathing or using antiperspirant products such as Duradry sweat minimizing gel.  Treatment Plan: Start ketoconazole  2% cream once or twice a day as needed for rash.  Start hydrocortisone  2.5% cream once or twice a day for up to 2 weeks as needed for rash.   Mix equal amounts of hydrocortisone  2.5%  cream with ketaconazole 2% cream and apply to affected areas twice a day.  If improved, decrease to hydrocortisone  and ketaconazole mixed once a day. If still clear, decrease to ketaconazole cream only, once daily to help prevent flares.  Do not use the hydrocortisone  cream for maintenance, since long term use of a topical steroid can thin the skin.  May repeat regimen as needed for flares.   Topical steroids (such as triamcinolone , fluocinolone, fluocinonide, mometasone , clobetasol, halobetasol, betamethasone, hydrocortisone ) can cause thinning and lightening of the skin if they are used for too long in the same area. Your physician has selected the right strength medicine for your problem and area affected on the body. Please use your medication only as directed by your physician to prevent side effects.   To prevent recurrence, recommend OTC Zeasorb AF powder to body folds daily after shower.  It is often found in the athlete's foot section in the pharmacy.  Avoid using powders that contain cornstarch.    INFLAMED SEBORRHEIC KERATOSIS (3) R inframammary x 1, L clavicle x 1, post neck x 1 (3) Symptomatic, irritating, patient would like treated. Destruction of lesion - R inframammary x 1, L clavicle x 1, post neck x 1 (3)  Destruction method: cryotherapy   Informed consent: discussed and consent obtained   Lesion destroyed using liquid nitrogen: Yes   Region frozen until ice ball extended beyond lesion: Yes   Outcome: patient tolerated procedure well with no complications   Post-procedure details: wound care instructions given  Additional details:  Prior to procedure, discussed risks of blister formation, small wound, skin dyspigmentation, or rare scar following cryotherapy. Recommend Vaseline ointment to treated areas while healing.   VIRAL WARTS, UNSPECIFIED TYPE L lat index finger DIP x 1 Vs Digital Mucous Cyst  Viral Wart (HPV) Counseling  Discussed viral / HPV (Human Papilloma Virus)  etiology and risk of spread /infectivity to other areas of body as well as to other people.  Multiple treatments and methods may be required to clear warts and it is possible treatment may not be successful.  Treatment risks include discoloration; scarring and there is still potential for wart recurrence. Destruction of lesion - L lat index finger DIP x 1  Destruction method: cryotherapy   Informed consent: discussed and consent obtained   Lesion destroyed using liquid nitrogen: Yes   Region frozen until ice ball extended beyond lesion: Yes   Outcome: patient tolerated procedure well with no complications   Post-procedure details: wound care instructions given   Additional details:  Prior to procedure, discussed risks of blister formation, small wound, skin dyspigmentation, or rare scar following cryotherapy. Recommend Vaseline ointment to treated areas while healing.    Return for as scheduled for TBSE, Hx of Dysplastic nevi.  I, Grayce Saunas, RMA, am acting as scribe for Rexene Rattler, MD .   Documentation: I have reviewed the above documentation for accuracy and completeness, and I agree with the above.  Rexene Rattler, MD

## 2024-04-11 NOTE — Patient Instructions (Addendum)
 Start ketoconazole  2% cream once or twice a day as needed for rash.  Start hydrocortisone  2.5% cream once or twice a day for up to 2 weeks as needed for rash.   Mix equal amounts of hydrocortisone  2.5% cream with ketaconazole 2% cream and apply to affected areas twice a day.  If improved, decrease to hydrocortisone  and ketaconazole mixed once a day. If still clear, decrease to ketaconazole cream only, once daily to help prevent flares.  Do not use the hydrocortisone  cream for maintenance, since long term use of a topical steroid can thin the skin.  May repeat regimen as needed for flares.   Topical steroids (such as triamcinolone , fluocinolone, fluocinonide, mometasone , clobetasol, halobetasol, betamethasone, hydrocortisone ) can cause thinning and lightening of the skin if they are used for too long in the same area. Your physician has selected the right strength medicine for your problem and area affected on the body. Please use your medication only as directed by your physician to prevent side effects.   To prevent recurrence, recommend OTC Zeasorb AF powder to body folds daily after shower.  It is often found in the athlete's foot section in the pharmacy.  Avoid using powders that contain cornstarch.    Cryotherapy Aftercare  Wash gently with soap and water everyday.   Apply Vaseline and Band-Aid daily until healed.   Due to recent changes in healthcare laws, you may see results of your pathology and/or laboratory studies on MyChart before the doctors have had a chance to review them. We understand that in some cases there may be results that are confusing or concerning to you. Please understand that not all results are received at the same time and often the doctors may need to interpret multiple results in order to provide you with the best plan of care or course of treatment. Therefore, we ask that you please give us  2 business days to thoroughly review all your results before contacting the  office for clarification. Should we see a critical lab result, you will be contacted sooner.   If You Need Anything After Your Visit  If you have any questions or concerns for your doctor, please call our main line at [phone] and press option 4 to reach your doctor's medical assistant. If no one answers, please leave a voicemail as directed and we will return your call as soon as possible. Messages left after 4 pm will be answered the following business day.   You may also send us  a message via MyChart. We typically respond to MyChart messages within 1-2 business days.  For prescription refills, please ask your pharmacy to contact our office. Our fax number is [phone].  If you have an urgent issue when the clinic is closed that cannot wait until the next business day, you can page your doctor at the number below.    Please note that while we do our best to be available for urgent issues outside of office hours, we are not available 24/7.   If you have an urgent issue and are unable to reach us , you may choose to seek medical care at your doctor's office, retail clinic, urgent care center, or emergency room.  If you have a medical emergency, please immediately call 911 or go to the emergency department.  Pager Numbers  - Dr. Hester: [phone]  - Dr. Jackquline: [phone]  - Dr. Claudene: [phone]   In the event of inclement weather, please call our main line at [phone] for an update on  the status of any delays or closures.  Dermatology Medication Tips: Please keep the boxes that topical medications come in in order to help keep track of the instructions about where and how to use these. Pharmacies typically print the medication instructions only on the boxes and not directly on the medication tubes.   If your medication is too expensive, please contact our office at [phone] option 4 or send us  a message through MyChart.   We are unable to tell what your  co-pay for medications will be in advance as this is different depending on your insurance coverage. However, we may be able to find a substitute medication at lower cost or fill out paperwork to get insurance to cover a needed medication.   If a prior authorization is required to get your medication covered by your insurance company, please allow us  1-2 business days to complete this process.  Drug prices often vary depending on where the prescription is filled and some pharmacies may offer cheaper prices.  The website www.goodrx.com contains coupons for medications through different pharmacies. The prices here do not account for what the cost may be with help from insurance (it may be cheaper with your insurance), but the website can give you the price if you did not use any insurance.  - You can print the associated coupon and take it with your prescription to the pharmacy.  - You may also stop by our office during regular business hours and pick up a GoodRx coupon card.  - If you need your prescription sent electronically to a different pharmacy, notify our office through [facility] or by phone at [phone] option 4.     Si Usted Necesita Algo Despus de Su Visita  Tambin puede enviarnos un mensaje a travs de Clinical cytogeneticist. Por lo general respondemos a los mensajes de MyChart en el transcurso de 1 a 2 das hbiles.  Para renovar recetas, por favor pida a su farmacia que se ponga en contacto con nuestra oficina. Randi lakes de fax es [place] [phone].  Si tiene un asunto urgente cuando la clnica est cerrada y que no puede esperar hasta el siguiente da hbil, puede llamar/localizar a su doctor(a) al nmero que aparece a continuacin.   Por favor, tenga en cuenta que aunque hacemos todo lo posible para estar disponibles para asuntos urgentes fuera del horario de [place], no estamos disponibles las 24 horas del da, los 7 [address]  [address] de la [place].   Si tiene un problema urgente y no  puede comunicarse con nosotros, puede optar por buscar atencin mdica  en el consultorio de su doctor(a), en una clnica privada, en un centro de atencin urgente o en una sala de emergencias.  Si tiene Engineer, drilling, por favor llame inmediatamente al 911 o vaya a la sala de emergencias.  Nmeros de bper  - Dr. Hester: [phone]  - Dra. Jackquline: [phone]  - Dr. Claudene: [phone]   En caso de inclemencias del tiempo, por favor llame a landry capes principal al [phone] para una actualizacin sobre el [place] de cualquier retraso o cierre.  Consejos para la medicacin en dermatologa: Por favor, guarde las cajas en las que vienen los medicamentos de uso tpico para ayudarle a seguir las instrucciones sobre dnde y cmo usarlos. Las farmacias generalmente imprimen las instrucciones del medicamento slo en las cajas y no directamente en los tubos del [place].   Si su medicamento es muy caro, por favor, pngase en contacto con nuestra oficina llamando al  [phone] y presione la opcin 4 o envenos un mensaje a travs de Clinical cytogeneticist.   No podemos decirle cul ser su copago por los medicamentos por adelantado ya que esto es diferente dependiendo de la cobertura de su seguro. Sin embargo, es posible que podamos encontrar un medicamento sustituto a Audiological scientist un formulario para que el seguro cubra el medicamento que se considera necesario.   Si se requiere una autorizacin previa para que su compaa de seguros malta su medicamento, por favor permtanos de 1 a 2 das hbiles para completar este proceso.  Los precios de los medicamentos varan con frecuencia dependiendo del Environmental consultant de dnde se surte la receta y alguna farmacias pueden ofrecer precios ms baratos.  El sitio web www.goodrx.com tiene cupones para medicamentos de Health and safety inspector. Los precios aqu no tienen en cuenta lo que podra costar con la ayuda del seguro (puede ser ms barato con su seguro),  pero el sitio web puede darle el precio si no utiliz Tourist information centre manager.  - Puede imprimir el cupn correspondiente y llevarlo con su receta a la farmacia.  - Tambin puede pasar por nuestra oficina durante el horario de atencin regular y Education officer, museum una tarjeta de cupones de GoodRx.  - Si necesita que su receta se enve electrnicamente a una farmacia diferente, informe a nuestra oficina a travs de MyChart de [place] o por telfono llamando al [phone] y presione la opcin 4.

## 2024-04-25 ENCOUNTER — Ambulatory Visit
Admission: RE | Admit: 2024-04-25 | Discharge: 2024-04-25 | Disposition: A | Discharge status: Home / Self Care | Source: Ambulatory Visit | Attending: Family Medicine | Admitting: Family Medicine

## 2024-04-25 DIAGNOSIS — Z1231 Encounter for screening mammogram for malignant neoplasm of breast: Secondary | ICD-10-CM | POA: Diagnosis present

## 2024-06-06 ENCOUNTER — Ambulatory Visit: Payer: Medicare Other | Admitting: Dermatology

## 2024-06-07 ENCOUNTER — Other Ambulatory Visit: Payer: Self-pay

## 2024-06-07 ENCOUNTER — Encounter: Payer: Self-pay | Admitting: Dermatology

## 2024-06-07 ENCOUNTER — Ambulatory Visit (INDEPENDENT_AMBULATORY_CARE_PROVIDER_SITE_OTHER): Payer: Medicare Other | Admitting: Dermatology

## 2024-06-07 DIAGNOSIS — L304 Erythema intertrigo: Secondary | ICD-10-CM

## 2024-06-07 DIAGNOSIS — L814 Other melanin hyperpigmentation: Secondary | ICD-10-CM | POA: Diagnosis not present

## 2024-06-07 DIAGNOSIS — Z872 Personal history of diseases of the skin and subcutaneous tissue: Secondary | ICD-10-CM

## 2024-06-07 DIAGNOSIS — L578 Other skin changes due to chronic exposure to nonionizing radiation: Secondary | ICD-10-CM

## 2024-06-07 DIAGNOSIS — L821 Other seborrheic keratosis: Secondary | ICD-10-CM

## 2024-06-07 DIAGNOSIS — B079 Viral wart, unspecified: Secondary | ICD-10-CM | POA: Diagnosis not present

## 2024-06-07 DIAGNOSIS — L0100 Impetigo, unspecified: Secondary | ICD-10-CM

## 2024-06-07 DIAGNOSIS — L719 Rosacea, unspecified: Secondary | ICD-10-CM

## 2024-06-07 DIAGNOSIS — Z1283 Encounter for screening for malignant neoplasm of skin: Secondary | ICD-10-CM | POA: Diagnosis not present

## 2024-06-07 DIAGNOSIS — W908XXA Exposure to other nonionizing radiation, initial encounter: Secondary | ICD-10-CM

## 2024-06-07 DIAGNOSIS — L820 Inflamed seborrheic keratosis: Secondary | ICD-10-CM

## 2024-06-07 DIAGNOSIS — D229 Melanocytic nevi, unspecified: Secondary | ICD-10-CM

## 2024-06-07 DIAGNOSIS — D1801 Hemangioma of skin and subcutaneous tissue: Secondary | ICD-10-CM

## 2024-06-07 DIAGNOSIS — D225 Melanocytic nevi of trunk: Secondary | ICD-10-CM

## 2024-06-07 MED ORDER — MUPIROCIN 2 % EX OINT: 1.0000 | TOPICAL_OINTMENT | Freq: Two times a day (BID) | CUTANEOUS | 6 refills | Status: DC

## 2024-06-07 MED ORDER — METRONIDAZOLE 0.75 % EX CREA: TOPICAL_CREAM | CUTANEOUS | 11 refills | Status: AC

## 2024-06-07 MED ORDER — MUPIROCIN 2 % EX OINT
1.0000 | TOPICAL_OINTMENT | Freq: Two times a day (BID) | CUTANEOUS | 6 refills | Status: AC
Start: 2024-06-07 — End: ?

## 2024-06-07 NOTE — Progress Notes (Signed)
 Follow-Up Visit   Subjective  Jane Riley is a 73 y.o. female who presents for the following: Skin Cancer Screening and Full Body Skin Exam hx of Dysplastic Nevi, Rosacea face, Metronidazole  0.75% cr qhs, Intertrigo inframammary Ketoconazole  cr, HC 2.5% cr, bumps chest, behind ears itchy prn, picks at   The patient presents for Total-Body Skin Exam (TBSE) for skin cancer screening and mole check. The patient has spots, moles and lesions to be evaluated, some may be new or changing and the patient may have concern these could be cancer.    The following portions of the chart were reviewed this encounter and updated as appropriate: medications, allergies, medical history  Review of Systems:  No other skin or systemic complaints except as noted in HPI or Assessment and Plan.  Objective  Well appearing patient in no apparent distress; mood and affect are within normal limits.  A full examination was performed including scalp, head, eyes, ears, nose, lips, neck, chest, axillae, abdomen, back, buttocks, bilateral upper extremities, bilateral lower extremities, hands, feet, fingers, toes, fingernails, and toenails. All findings within normal limits unless otherwise noted below.   Relevant physical exam findings are noted in the Assessment and Plan.  L post auricular x 1, L sideburn x 1, L shoudler x 1, R chest x 1, L hand dorsum x 1 (5) Stuck on waxy papules with erythema L lat index finger DIP x 1 Firm verrucous papule 2.5 mm- Discussed viral etiology and contagion.   Assessment & Plan   SKIN CANCER SCREENING PERFORMED TODAY.  ACTINIC DAMAGE chest - Chronic condition, secondary to cumulative UV/sun exposure - diffuse scaly erythematous macules with underlying dyspigmentation - Recommend daily broad spectrum sunscreen SPF 30+ to sun-exposed areas, reapply every 2 hours as needed.  - Staying in the shade or wearing long sleeves, sun glasses (UVA+UVB protection) and wide brim hats  (4-inch brim around the entire circumference of the hat) are also recommended for sun protection.  - Call for new or changing lesions.  LENTIGINES, SEBORRHEIC KERATOSES, HEMANGIOMAS - Benign normal skin lesions - Benign-appearing - Call for any changes  MELANOCYTIC NEVI - Tan-brown and/or pink-flesh-colored symmetric macules and papules - Benign appearing on exam today - Observation - Call clinic for new or changing moles - Recommend daily use of broad spectrum spf 30+ sunscreen to sun-exposed areas.  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 Currently well-controlled.   Intertrigo is a chronic recurrent rash that occurs in skin fold areas that may be associated with friction; heat; moisture; yeast; fungus; and bacteria.  It is exacerbated by increased movement / activity; sweating; and higher atmospheric temperature.  Use of an absorbant powder such as Zeasorb AF powder or other OTC antifungal powder to the area daily can prevent rash recurrence. Other options to help keep the area dry include blow drying the area after bathing or using antiperspirant products such as Duradry sweat minimizing gel.  Treatment Plan: Cont Ketoconazole  2% cr qd/bid Cont HC 2.5% cr qd/bid for up to 2 wks prn flares   Topical steroids (such as triamcinolone , fluocinolone, fluocinonide, mometasone , clobetasol, halobetasol, betamethasone, hydrocortisone ) can cause thinning and lightening of the skin if they are used for too long in the same area. Your physician has selected the right strength medicine for your problem and area affected on the body. Please use your medication only as directed by your physician to prevent side  effects.    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 Currently well-controlled.  Rosacea is a chronic  progressive skin condition usually affecting the face of adults, causing redness and/or acne bumps. It is treatable but not curable. It sometimes affects the eyes (ocular rosacea) as well. It may respond to topical and/or systemic medication and can flare with stress, sun exposure, alcohol, exercise, topical steroids (including hydrocortisone /cortisone 10) and some foods.  Daily application of broad spectrum spf 30+ sunscreen to face is recommended to reduce flares.   Treatment Plan Cont Metronidazole  0.75% cr qd/bid   HISTORY OF DYSPLASTIC NEVUS No evidence of recurrence today- L post shoulder, L buttock, L shoulder Recommend regular full body skin exams Recommend daily broad spectrum sunscreen SPF 30+ to sun-exposed areas, reapply every 2 hours as needed.  Call if any new or changing lesions are noted between office visits    HISTORY OF PRECANCEROUS ACTINIC KERATOSIS - site(s) of PreCancerous Actinic Keratosis clear today. - these may recur and new lesions may form requiring treatment to prevent transformation into skin cancer - observe for new or changing spots and contact [facility] for appointment if occur - photoprotection with sun protective clothing; sunglasses and broad spectrum sunscreen with SPF of at least 30 + and frequent self skin exams recommended - yearly exams by a dermatologist recommended for persons with history of PreCancerous Actinic Keratoses   IMPETIGO SORES nose Exam: clear today, comes and goes per pt, mupirocin  helps clear it up  Treatment Plan: Continue Mupirocin  oint bid prn flares  INFLAMED SEBORRHEIC KERATOSIS (5) L post auricular x 1, L sideburn x 1, L shoudler x 1, R chest x 1, L hand dorsum x 1 (5) Symptomatic, irritating, patient would like treated. Destruction of lesion - L post auricular x 1, L sideburn x 1, L shoudler x 1, R chest x 1, L hand dorsum x 1 (5)  Destruction method: cryotherapy   Informed consent: discussed and consent  obtained   Lesion destroyed using liquid nitrogen: Yes   Region frozen until ice ball extended beyond lesion: Yes   Outcome: patient tolerated procedure well with no complications   Post-procedure details: wound care instructions given   Additional details:  Prior to procedure, discussed risks of blister formation, small wound, skin dyspigmentation, or rare scar following cryotherapy. Recommend Vaseline ointment to treated areas while healing.   VIRAL WARTS, UNSPECIFIED TYPE L lat index finger DIP x 1 Viral Wart (HPV) Counseling  Discussed viral / HPV (Human Papilloma Virus) etiology and risk of spread /infectivity to other areas of body as well as to other people.  Multiple treatments and methods may be required to clear warts and it is possible treatment may not be successful.  Treatment risks include discoloration; scarring and there is still potential for wart recurrence. Destruction of lesion - L lat index finger DIP x 1  Destruction method: cryotherapy   Informed consent: discussed and consent obtained   Lesion destroyed using liquid nitrogen: Yes   Region frozen until ice ball extended beyond lesion: Yes   Outcome: patient tolerated procedure well with no complications   Post-procedure details: wound care instructions given   Additional details:  Prior to procedure, discussed risks of blister formation, small wound, skin dyspigmentation, or rare scar following cryotherapy. Recommend Vaseline ointment to treated areas while healing.    Return in about 1 year (around 06/07/2025) for TBSE, Hx of Dysplastic nevi.  I, Grayce Saunas, RMA, am acting as scribe for Rexene Rattler, MD .   Documentation: I have reviewed the above documentation for accuracy and completeness, and I agree with the above.  Rexene Rattler, MD

## 2024-06-07 NOTE — Patient Instructions (Signed)

## 2024-06-07 NOTE — Progress Notes (Signed)
 Correcting medication directions. aw

## 2024-08-12 ENCOUNTER — Ambulatory Visit: Payer: Self-pay | Admitting: Internal Medicine

## 2024-08-12 NOTE — Telephone Encounter (Signed)
 FYI Only or Action Required?: FYI only for provider: appointment scheduled on 12/2.  Patient is followed in Pulmonology for reactive airway disease, last seen on 12/14/2023 by Isaiah Scrivener, MD.  Called Nurse Triage reporting Shortness of Breath.  Symptoms began several weeks ago.  Symptoms are: completely resolved.  Triage Disposition: See PCP Within 2 Weeks  Patient/caregiver understands and will follow disposition?: Yes with modifications, first available appointment at preferred location scheduled and added to wait list       Copied from CRM #[id number]. Topic: Clinical - Red Word Triage >> Aug 12, 2024 12:32 PM Nathanel DEL wrote: Red Word that prompted transfer to Nurse Triage: this past weekend struggled to breathe in high atmosphere.  [place] No appts w/ Drr Isaiah until Jan      Reason for Disposition  [1] MILD longstanding difficulty breathing (e.g., minimal/no SOB at rest, SOB with walking, pulse < 100) AND [2] SAME as normal    No difficulty breathing at this time, earliest appointment at preferred office scheduled and added to the wait list  Answer Assessment - Initial Assessment Questions Patient reports symptoms were present when at high altitude recently when [place] of [place]. She states she has not had any symptoms since returning home. First available appointment at preferred office scheduled and added to the wait list. Patient instructed to call back for new or worsening symptoms. Patient verbalized understanding and agreement with this plan.     1. RESPIRATORY STATUS: Describe your breathing? (e.g., wheezing, shortness of breath, unable to speak, severe coughing)      Difficulty breathing at high altitudes  2. ONSET: When did this breathing problem begin?      A couple of weeks ago  3. PATTERN Does the difficult breathing come and go, or has it been constant since it started?      No difficulty breathing now  4. SEVERITY: How  bad is your breathing? (e.g., mild, moderate, severe)      Moderate  5. RECURRENT SYMPTOM: Have you had difficulty breathing before? If Yes, ask: When was the last time? and What happened that time?      Yes 6. CARDIAC HISTORY: Do you have any history of heart disease? (e.g., heart attack, angina, bypass surgery, angioplasty)      Hypertension  7. LUNG HISTORY: Do you have any history of lung disease?  (e.g., pulmonary embolus, asthma, emphysema)     Reactive airway disease  8. CAUSE: What do you think is causing the breathing problem?      Unsure if due to exertion or the altitude  9. OTHER SYMPTOMS: Do you have any other symptoms? (e.g., chest pain, cough, dizziness, fever, runny nose)     None 10. O2 SATURATION MONITOR:  Do you use an oxygen saturation monitor (pulse oximeter) at home? If Yes, ask: What is your reading (oxygen level) today? What is your usual oxygen saturation reading? (e.g., 95%)       No  Protocols used: Breathing Difficulty-A-AH

## 2024-08-12 NOTE — Telephone Encounter (Signed)
 I spoke with the patient and scheduled her an appt with Dr. Isaiah on 11/14 at 10:45am.  Nothing further needed.

## 2024-08-19 ENCOUNTER — Ambulatory Visit: Admitting: Internal Medicine

## 2024-08-19 ENCOUNTER — Encounter: Payer: Self-pay | Admitting: Internal Medicine

## 2024-08-19 DIAGNOSIS — J4540 Moderate persistent asthma, uncomplicated: Secondary | ICD-10-CM

## 2024-08-19 MED ORDER — ALBUTEROL SULFATE HFA 108 (90 BASE) MCG/ACT IN AERS: 2.0000 | INHALATION_SPRAY | Freq: Four times a day (QID) | RESPIRATORY_TRACT | 12 refills | Status: AC | PRN

## 2024-08-19 MED ORDER — BUDESONIDE-FORMOTEROL FUMARATE 160-4.5 MCG/ACT IN AERO: 2.0000 | INHALATION_SPRAY | Freq: Two times a day (BID) | RESPIRATORY_TRACT | 12 refills | Status: AC

## 2024-08-19 NOTE — Progress Notes (Signed)
 [facility] Pulmonary Medicine Consultation     Date: 08/19/2024  MRN# [id number] Jane Riley [birth year]   SYNOPSIS The patient is a 73 year old female with a cough which started in the first week of June, but has been present on and off for about 3 years. At last visit was taken off ace and switched to Norvasc  but stopped due to leg swelling. She was started on Arnuity sample which did not help. She has been restarted on lisinopril after a trial off of it which made no difference.   She underwent bronchoscopy on 05/27/18, this showed changes of Tracheobronchopathia osteochondroplastica, chronic bronchitis. Cultures, AFB were negative.   She has a dog, which sleeps in the bed.  She was tested for allergies, she was allergic to several things, but not the dog. She has completed allergy  shots.  She does not snore at night, has never been tested for OSA, she is not sleepy during the day.     **CT chest 09/07/2018>> images personally reviewed, in comparison with previous on 03/19/2018 there remain tiny bilateral nodules, largest of which is about 6 mm, not significantly changed from previous. **CT chest 03/19/2018>> images personally reviewed, there are tiny bilateral basal nodules, largest of which is 7 mm. **Absolute eosinophil count 05/28/2017>> 100  **PFT 11/23/2016>> tracings personally reviewed, consistent with normal pulmonary functions. 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  Clinical Correlation Advised   Based on above  findings of pulmonary function testing approximately 1 year ago and the change in the PFTs most likely has some underlying reactive airways disease with bronchospasms of  small obstructive airways disease with hyperinflation and air trapping   CC  Follow-up assessment active airways disease    HPI No exacerbation at this time No evidence of heart failure at this time No evidence or signs of infection at this time No respiratory distress No fevers, chills, nausea, vomiting, diarrhea No evidence of lower extremity edema No evidence hemoptysis [place] exposed [place] air which exacerbated her symptoms Findings are suggestive of underlying asthma  No fevers no chills no signs of infection At this time we will start long-acting beta agonist and inhaled corticosteroids with Symbicort I also advised to use bronchodilators with albuterol  as needed   Chronic cough for many years Significant secondhand smoke exposure Quit tobacco 18 years ago    She does have a dog at home she denies having allergies to dogs or foods She is sensitive to bleaches and some perfumes I reassured the patient that bilateral pulmonary nodules subcentimeter in size does not cause chronic bronchitis    Assessment of ASTHMA FeNO  12   ppb-Elevated exhaled Nitric oxide testing is NOT  consistent with type II inflammation Due to her symptoms we will plan to start inhaled corticosteroids and long-acting beta agonist     Medication:    Current Outpatient Medications:    clotrimazole (LOTRIMIN) 1 % cream, Apply 1 Application topically 2 (two) times daily., Disp: , Rfl:    albuterol  (VENTOLIN  HFA) 108 (90 Base)  MCG/ACT inhaler, Inhale 2 puffs into the lungs every 6 (six) hours as needed for wheezing or shortness of breath., Disp: 8 g, Rfl: 10   Biotin 5000 MCG TABS, Take 5,000 mcg by mouth daily. , Disp: , Rfl:    busPIRone (BUSPAR) 5 MG tablet, , Disp: , Rfl:     carboxymethylcellulose (REFRESH TEARS) 0.5 % SOLN, Apply to eye., Disp: , Rfl:    celecoxib (CELEBREX) 200 MG capsule, Take 200 mg by mouth daily., Disp: , Rfl:    Cholecalciferol (VITAMIN D3) 1000 units CAPS, Take 1,000 Units by mouth daily. , Disp: , Rfl:    citalopram  (CELEXA ) 20 MG tablet, Take 20 mg by mouth daily. , Disp: , Rfl:    Coenzyme Q10 (COQ10) 200 MG CAPS, Take 200 mg by mouth daily. , Disp: , Rfl:    hydrocortisone  2.5 % cream, Apply topically 2 (two) times daily as needed (Rash). once or twice a day for up to 2 weeks as needed for rash under breast, Disp: 30 g, Rfl: 4   ketoconazole  (NIZORAL ) 2 % cream, Apply 1 Application topically 2 (two) times daily. Bid to aa rash under breast prn flares, Disp: 60 g, Rfl: 6   levocetirizine (XYZAL ) 5 MG tablet, Take 1 tablet (5 mg total) by mouth every evening., Disp: 30 tablet, Rfl: 0   lipase/protease/amylase (CREON ) 36000 UNITS CPEP capsule, Take 2 capsule with the first bite of each meal and 1 capsule with the first bite of each snack., Disp: 240 capsule, Rfl: 3   lisinopril-hydrochlorothiazide  (ZESTORETIC ) 20-12.5 MG tablet, Take by mouth., Disp: , Rfl:    lovastatin  (MEVACOR ) 40 MG tablet, Take by mouth., Disp: , Rfl:    Magnesium Oxide 500 MG TABS, Take 500 mg by mouth daily with breakfast. , Disp: , Rfl:    metroNIDAZOLE  (METROCREAM ) 0.75 % cream, Apply to affected areas once to twice daily for rosacea., Disp: 45 g, Rfl: 11   mupirocin  ointment (BACTROBAN ) 2 %, Apply 1 Application topically 2 (two) times daily. bid to aa sores nose prn flares, Disp: 22 g, Rfl: 6   nystatin  cream (MYCOSTATIN ), Apply 1 Application topically 2 (two) times daily. (Patient taking differently: Apply 1 Application topically as needed.), Disp: 30 g, Rfl: 0   Omega-3 Fatty Acids (KP FISH OIL) 1200 MG CAPS, Take 1,200 mg by mouth daily. , Disp: , Rfl:    omeprazole  (PRILOSEC) 20 MG capsule, Take 1 capsule (20 mg total) by mouth daily., Disp: 30 capsule, Rfl: 0    predniSONE  (DELTASONE ) 10 MG tablet, Take 1 tablet (10 mg total) by mouth daily with breakfast., Disp: 5 tablet, Rfl: 0   raloxifene  (EVISTA ) 60 MG tablet, Take 60 mg by mouth daily., Disp: , Rfl:    vitamin E 400 UNIT capsule, Take 400 Units by mouth daily., Disp: , Rfl:    Allergies:  Codeine, Doxycycline , Adhesive [tape], Amlodipine , Amoxicillin, Nsaids, Other, Oxycodone, and Pravastatin  BP 120/80   Pulse 77   Temp 98.4 F (36.9 C)   Ht 5' 4 (1.626 m)   Wt 154 lb 3.2 oz (69.9 kg)   SpO2 96%   BMI 26.47 kg/m   Review of Systems  Constitutional: Negative.   Respiratory:  Positive for cough, shortness of breath and wheezing.   Cardiovascular: Negative.      Physical Exam Constitutional:      Appearance: Normal appearance.  Cardiovascular:     Rate and Rhythm: Normal rate and regular rhythm.     Pulses:  Normal pulses.     Heart sounds: Normal heart sounds.  Pulmonary:     Effort: Pulmonary effort is normal.     Breath sounds: Normal breath sounds.  Neurological:     General: No focal deficit present.     Mental Status: She is alert.        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  Signs symptoms of reactive airways disease possible asthma  Mild intermittent asthma Symptoms have been increasing since her visits to high altitudes Plan to start Symbicort twice daily Plan to use albuterol  as needed Avoid Allergens and Irritants Avoid secondhand smoke Avoid SICK contacts Recommend  Masking  when appropriate Recommend Keep up-to-date with vaccinations If symptoms do not dissipate recommend cardiology evaluation  Tiny lung nodules largest measuring 6 mm Very low risk for lung cancer Unchanged over the last several years CT of the chest reviewed in detail with the patient patient has CT scan of her abdomen in November 2022  and previous CT chest was December 2020 Findings suggest benign findings in the left lower lobe nodules pleural-based   MEDICATION ADJUSTMENTS/LABS AND TESTS ORDERED: Start Symbicort Albuterol  2 puffs as needed every 6 hrs Avoid secondhand smoke Avoid SICK contacts Recommend  Masking  when appropriate Recommend Keep up-to-date with vaccinations  CURRENT MEDICATIONS REVIEWED AT LENGTH WITH PATIENT TODAY   Patient  satisfied with Plan of action and management. All questions answered   Follow up 3 months   I spent a total of 48 minutes dedicated to the care of this patient on the date of this encounter to include pre-visit review of records, face-to-face time with the patient discussing conditions above, post visit ordering of testing, clinical documentation with the electronic health record, making appropriate referrals as documented, and communicating necessary information to the patient's healthcare team.    The Patient requires high complexity decision making for assessment and support, frequent evaluation and titration of therapies, application of advanced monitoring technologies and extensive interpretation of multiple databases.  Patient satisfied with Plan of action and management. All questions answered    Nickolas Alm Cellar, M.D.  [facility] Medicine  Medical Director [facility]

## 2024-08-19 NOTE — Patient Instructions (Signed)
 Recommend starting Symbicort inhaler 2 puffs in the morning 2 puffs at night Please rinse your mouth after use Use albuterol  2 puffs every 6 hours as needed and prior to exertion  Avoid Allergens and Irritants Avoid secondhand smoke Avoid SICK contacts Recommend  Masking  when appropriate Recommend Keep up-to-date with vaccinations

## 2024-09-06 ENCOUNTER — Ambulatory Visit: Admitting: Pulmonary Disease

## 2024-09-26 ENCOUNTER — Encounter: Payer: Self-pay | Admitting: Internal Medicine

## 2024-09-26 MED ORDER — METHYLPREDNISOLONE 4 MG PO TBPK: ORAL_TABLET | ORAL | 0 refills | Status: DC

## 2024-09-26 MED ORDER — AZITHROMYCIN 250 MG PO TABS: ORAL_TABLET | ORAL | 0 refills | Status: AC

## 2024-10-24 ENCOUNTER — Encounter: Payer: Self-pay | Admitting: *Deleted

## 2024-10-24 ENCOUNTER — Emergency Department

## 2024-10-24 ENCOUNTER — Encounter (HOSPITAL_COMMUNITY): Payer: Self-pay

## 2024-10-24 ENCOUNTER — Emergency Department
Admission: EM | Admit: 2024-10-24 | Discharge: 2024-10-25 | Disposition: A | Discharge status: Other Acute Inpatient Hospital | Attending: Emergency Medicine | Admitting: Emergency Medicine

## 2024-10-24 ENCOUNTER — Other Ambulatory Visit: Payer: Self-pay

## 2024-10-24 DIAGNOSIS — I442 Atrioventricular block, complete: Secondary | ICD-10-CM | POA: Insufficient documentation

## 2024-10-24 DIAGNOSIS — E876 Hypokalemia: Secondary | ICD-10-CM | POA: Insufficient documentation

## 2024-10-24 DIAGNOSIS — I10 Essential (primary) hypertension: Secondary | ICD-10-CM | POA: Insufficient documentation

## 2024-10-24 DIAGNOSIS — R42 Dizziness and giddiness: Secondary | ICD-10-CM

## 2024-10-24 LAB — COMPREHENSIVE METABOLIC PANEL WITH GFR
ALT: 157 U/L — ABNORMAL HIGH (ref 0–44)
AST: 176 U/L — ABNORMAL HIGH (ref 15–41)
Albumin: 5 g/dL (ref 3.5–5.0)
Alkaline Phosphatase: 59 U/L (ref 38–126)
Anion gap: 16 — ABNORMAL HIGH (ref 5–15)
BUN: 14 mg/dL (ref 8–23)
CO2: 23 mmol/L (ref 22–32)
Calcium: 9.7 mg/dL (ref 8.9–10.3)
Chloride: 100 mmol/L (ref 98–111)
Creatinine, Ser: 0.9 mg/dL (ref 0.44–1.00)
GFR, Estimated: >60 mL/min
Glucose, Bld: 91 mg/dL (ref 70–99)
Potassium: 3.4 mmol/L — ABNORMAL LOW (ref 3.5–5.1)
Sodium: 139 mmol/L (ref 135–145)
Total Bilirubin: 0.2 mg/dL (ref 0.0–1.2)
Total Protein: 7.2 g/dL (ref 6.5–8.1)

## 2024-10-24 LAB — RESP PANEL BY RT-PCR (RSV, FLU A&B, COVID)  RVPGX2
Influenza A by PCR: NEGATIVE
Influenza B by PCR: NEGATIVE
Resp Syncytial Virus by PCR: NEGATIVE
SARS Coronavirus 2 by RT PCR: NEGATIVE

## 2024-10-24 LAB — CBC WITH DIFFERENTIAL/PLATELET
Abs Immature Granulocytes: 0.01 K/uL (ref 0.00–0.07)
Basophils Absolute: 0 K/uL (ref 0.0–0.1)
Basophils Relative: 0 %
Eosinophils Absolute: 0.1 K/uL (ref 0.0–0.5)
Eosinophils Relative: 2 %
HCT: 40.9 % (ref 36.0–46.0)
Hemoglobin: 14 g/dL (ref 12.0–15.0)
Immature Granulocytes: 0 %
Lymphocytes Relative: 33 %
Lymphs Abs: 1.7 K/uL (ref 0.7–4.0)
MCH: 31.3 pg (ref 26.0–34.0)
MCHC: 34.2 g/dL (ref 30.0–36.0)
MCV: 91.5 fL (ref 80.0–100.0)
Monocytes Absolute: 0.4 K/uL (ref 0.1–1.0)
Monocytes Relative: 9 %
Neutro Abs: 2.8 K/uL (ref 1.7–7.7)
Neutrophils Relative %: 56 %
Platelets: 210 K/uL (ref 150–400)
RBC: 4.47 MIL/uL (ref 3.87–5.11)
RDW: 13.2 % (ref 11.5–15.5)
WBC: 4.9 K/uL (ref 4.0–10.5)
nRBC: 0 % (ref 0.0–0.2)

## 2024-10-24 LAB — MAGNESIUM: Magnesium: 2.1 mg/dL (ref 1.7–2.4)

## 2024-10-24 LAB — TROPONIN T, HIGH SENSITIVITY
Troponin T High Sensitivity: <15 ng/L (ref 0–19)
Troponin T High Sensitivity: 17 ng/L (ref 0–19)

## 2024-10-24 LAB — TSH: TSH: 9.43 u[IU]/mL — ABNORMAL HIGH (ref 0.350–4.500)

## 2024-10-24 LAB — T4, FREE: Free T4: 1.15 ng/dL (ref 0.80–2.00)

## 2024-10-24 MED ORDER — METOCLOPRAMIDE HCL 5 MG/ML IJ SOLN
10.0000 mg | Freq: Once | INTRAMUSCULAR | Status: AC
  Administered 2024-10-24: 10 mg via INTRAVENOUS
  Filled 2024-10-24: qty 2

## 2024-10-24 MED ORDER — POTASSIUM CHLORIDE 10 MEQ/100ML IV SOLN
10.0000 meq | INTRAVENOUS | Status: AC
  Administered 2024-10-24 (×2): 10 meq via INTRAVENOUS
  Filled 2024-10-24 (×2): qty 100

## 2024-10-24 MED ORDER — ONDANSETRON HCL 4 MG/2ML IJ SOLN: 4.0000 mg | Freq: Once | INTRAMUSCULAR | Status: DC

## 2024-10-24 NOTE — ED Notes (Signed)
 Called [facility] for transfer to Hospitalist service waiting on call back

## 2024-10-24 NOTE — ED Triage Notes (Addendum)
 Pt to triage via wheelchair.  Pt reports dizziness, headache, nausea.  Pt has sob.  No chest pain  no slurred speech.  Sx began at 1830 tonight.  Pt struck head on dryer door last week and has intermittent h/a since last week.  Pt also has neck pain.   Pt alert  speech clear.

## 2024-10-24 NOTE — ED Notes (Signed)
 Accepted to [facility], Dr. Feliciana Waiting on bed assignment

## 2024-10-24 NOTE — ED Notes (Signed)
 Pt to room 11 from triage after EKG.

## 2024-10-24 NOTE — ED Provider Notes (Signed)
 "  [facility] Provider Note    Event Date/Time   First MD Initiated Contact with Patient 10/24/24 2058     (approximate)   History   Dizziness   HPI  Jane Riley is a 74 y.o. female who presents to the ED for evaluation of Dizziness   Patient without cardiac history who presents to the ED with sudden onset dizziness, nausea and bilateral leg weakness since 6:30 PM.  No falls or syncope.  No abdominal pain or emesis.  History of hypertension but not on any AV nodal blockers   Physical Exam   Triage Vital Signs: ED Triage Vitals  Encounter Vitals Group     BP 10/24/24 2052 (!) 180/66     Girls Systolic BP Percentile --      Girls Diastolic BP Percentile --      Boys Systolic BP Percentile --      Boys Diastolic BP Percentile --      Pulse Rate 10/24/24 2100 (!) 38     Resp 10/24/24 2052 18     Temp 10/24/24 2052 98 F (36.7 C)     Temp Source 10/24/24 2052 Oral     SpO2 10/24/24 2052 96 %     Weight 10/24/24 2048 152 lb (68.9 kg)     Height 10/24/24 2048 5' 4 (1.626 m)     Head Circumference --      Peak Flow --      Pain Score 10/24/24 2048 0     Pain Loc --      Pain Education --      Exclude from Growth Chart --     Most recent vital signs: Vitals:   10/24/24 2121 10/24/24 2128  BP:  (!) 146/57  Pulse:  (!) 39  Resp:  14  Temp:    SpO2: 96% 96%    General: Awake, no distress.  CV:  Good peripheral perfusion.  Resp:  Normal effort.  Abd:  No distention.  MSK:  No deformity noted.  Neuro:  No focal deficits appreciated. Other:     ED Results / Procedures / Treatments   Labs (all labs ordered are listed, but only abnormal results are displayed) Labs Reviewed  COMPREHENSIVE METABOLIC PANEL WITH GFR - Abnormal; Notable for the following components:      Result Value   Potassium 3.4 (*)    AST 176 (*)    ALT 157 (*)    Anion gap 16 (*)    All other components within normal limits  RESP PANEL BY RT-PCR (RSV, FLU A&B,  COVID)  [id number]  CBC WITH DIFFERENTIAL/PLATELET  MAGNESIUM  T4, FREE  TSH  TROPONIN T, HIGH SENSITIVITY  TROPONIN T, HIGH SENSITIVITY    EKG Complete heart block with ventricular rate of about 40 bpm.  No STEMI  RADIOLOGY CXR interpreted by me without evidence of acute cardiopulmonary pathology.   Official radiology report(s): DG Chest Portable 1 View Result Date: 10/24/2024 EXAM: 1 VIEW XRAY OF THE CHEST 10/24/2024 09:30:00 PM COMPARISON: 03/11/2018 CLINICAL HISTORY: complete heart block FINDINGS: LUNGS AND PLEURA: No focal pulmonary opacity. No pleural effusion. No pneumothorax. HEART AND MEDIASTINUM: No acute abnormality of the cardiac and mediastinal silhouettes. BONES AND SOFT TISSUES: No acute osseous abnormality. IMPRESSION: 1. No acute process. Electronically signed by: Franky Crease MD 10/24/2024 09:32 PM EST RP Workstation: [id number]    PROCEDURES and INTERVENTIONS:  .Critical Care  Performed by: Claudene Rover, MD Authorized by: Claudene Rover, MD  Critical care provider statement:    Critical care time (minutes):  30   Critical care time was exclusive of:  Separately billable procedures and treating other patients   Critical care was necessary to treat or prevent imminent or life-threatening deterioration of the following conditions:  Cardiac failure and circulatory failure   Critical care was time spent personally by me on the following activities:  Development of treatment plan with patient or surrogate, discussions with consultants, evaluation of patient's response to treatment, examination of patient, ordering and review of laboratory studies, ordering and review of radiographic studies, ordering and performing treatments and interventions, pulse oximetry, re-evaluation of patient's condition and review of old charts .1-3 Lead EKG Interpretation  Performed by: Claudene Rover, MD Authorized by: Claudene Rover, MD     Interpretation: abnormal     ECG rate:  40   ECG rate  assessment: bradycardic     Rhythm: A-V block     Ectopy: none     Conduction: abnormal     Abnormal conduction: 3rd degree AV block     Medications  potassium chloride  10 mEq in 100 mL IVPB (10 mEq Intravenous New Bag/Given 10/24/24 2232)  metoCLOPramide  (REGLAN ) injection 10 mg (10 mg Intravenous Given 10/24/24 2122)     IMPRESSION / MDM / ASSESSMENT AND PLAN / ED COURSE  I reviewed the triage vital signs and the nursing notes.  Differential diagnosis includes, but is not limited to, cardiac dysrhythmia, concussion, ICH or stroke, renal failure  {Patient presents with symptoms of an acute illness or injury that is potentially life-threatening.  Patient presents with acute dizziness with signs of complete heart block requiring transfer to [facility] for permanent pacemaker placement.  Hemodynamically stable.  Mild hypokalemia, replace IV.  Normal CBC, troponin, clear CXR.  Clinical Course as of 10/24/24 2248  Mon Oct 24, 2024  2121 I consult with Dr. Kennyth, recommends transfer to [place] for permanent pacer. Hospitalist service. No one here at [facility] for that procedure this week.  [DS]  2221 Dr. Franky, hospitalist at [facility], accepting.  [DS]    Clinical Course User Index [DS] Claudene Rover, MD     FINAL CLINICAL IMPRESSION(S) / ED DIAGNOSES   Final diagnoses:  Complete heart block (HCC)  Dizziness     Rx / DC Orders   ED Discharge Orders     None        Note:  This document was prepared using Dragon voice recognition software and may include unintentional dictation errors.   Claudene Rover, MD 10/24/24 2249  "

## 2024-10-25 ENCOUNTER — Inpatient Hospital Stay (HOSPITAL_COMMUNITY)
Admission: AD | Admit: 2024-10-25 | Discharge: 2024-10-27 | DRG: 244 | Disposition: A | Discharge status: Home / Self Care | Attending: Internal Medicine | Admitting: Internal Medicine

## 2024-10-25 DIAGNOSIS — Z886 Allergy status to analgesic agent status: Secondary | ICD-10-CM

## 2024-10-25 DIAGNOSIS — M19041 Primary osteoarthritis, right hand: Secondary | ICD-10-CM | POA: Diagnosis present

## 2024-10-25 DIAGNOSIS — R001 Bradycardia, unspecified: Secondary | ICD-10-CM | POA: Diagnosis present

## 2024-10-25 DIAGNOSIS — Z7951 Long term (current) use of inhaled steroids: Secondary | ICD-10-CM | POA: Diagnosis not present

## 2024-10-25 DIAGNOSIS — E782 Mixed hyperlipidemia: Secondary | ICD-10-CM | POA: Diagnosis present

## 2024-10-25 DIAGNOSIS — Z885 Allergy status to narcotic agent status: Secondary | ICD-10-CM | POA: Diagnosis not present

## 2024-10-25 DIAGNOSIS — I442 Atrioventricular block, complete: Secondary | ICD-10-CM | POA: Diagnosis present

## 2024-10-25 DIAGNOSIS — R42 Dizziness and giddiness: Secondary | ICD-10-CM | POA: Diagnosis not present

## 2024-10-25 DIAGNOSIS — Z888 Allergy status to other drugs, medicaments and biological substances status: Secondary | ICD-10-CM

## 2024-10-25 DIAGNOSIS — Z87891 Personal history of nicotine dependence: Secondary | ICD-10-CM

## 2024-10-25 DIAGNOSIS — Z9049 Acquired absence of other specified parts of digestive tract: Secondary | ICD-10-CM | POA: Diagnosis not present

## 2024-10-25 DIAGNOSIS — R7401 Elevation of levels of liver transaminase levels: Secondary | ICD-10-CM | POA: Diagnosis present

## 2024-10-25 DIAGNOSIS — Z981 Arthrodesis status: Secondary | ICD-10-CM

## 2024-10-25 DIAGNOSIS — Z7952 Long term (current) use of systemic steroids: Secondary | ICD-10-CM

## 2024-10-25 DIAGNOSIS — R9431 Abnormal electrocardiogram [ECG] [EKG]: Secondary | ICD-10-CM | POA: Diagnosis not present

## 2024-10-25 DIAGNOSIS — M19042 Primary osteoarthritis, left hand: Secondary | ICD-10-CM | POA: Diagnosis present

## 2024-10-25 DIAGNOSIS — Z79899 Other long term (current) drug therapy: Secondary | ICD-10-CM

## 2024-10-25 DIAGNOSIS — M810 Age-related osteoporosis without current pathological fracture: Secondary | ICD-10-CM | POA: Diagnosis present

## 2024-10-25 DIAGNOSIS — K219 Gastro-esophageal reflux disease without esophagitis: Secondary | ICD-10-CM | POA: Diagnosis present

## 2024-10-25 DIAGNOSIS — Z803 Family history of malignant neoplasm of breast: Secondary | ICD-10-CM

## 2024-10-25 DIAGNOSIS — R55 Syncope and collapse: Secondary | ICD-10-CM | POA: Diagnosis present

## 2024-10-25 DIAGNOSIS — Z881 Allergy status to other antibiotic agents status: Secondary | ICD-10-CM | POA: Diagnosis not present

## 2024-10-25 DIAGNOSIS — K8681 Exocrine pancreatic insufficiency: Secondary | ICD-10-CM | POA: Diagnosis present

## 2024-10-25 DIAGNOSIS — I10 Essential (primary) hypertension: Secondary | ICD-10-CM | POA: Diagnosis present

## 2024-10-25 MED ORDER — LORAZEPAM 2 MG/ML IJ SOLN: 1.0000 mg | INTRAMUSCULAR | Status: DC | PRN

## 2024-10-25 MED ORDER — ADULT MULTIVITAMIN W/MINERALS CH
1.0000 | ORAL_TABLET | Freq: Every day | ORAL | Status: DC
  Administered 2024-10-25 – 2024-10-27 (×3): 1 via ORAL
  Filled 2024-10-25 (×3): qty 1

## 2024-10-25 MED ORDER — PANCRELIPASE (LIP-PROT-AMYL) 36000-114000 UNITS PO CPEP
36000.0000 [IU] | ORAL_CAPSULE | Freq: Three times a day (TID) | ORAL | Status: DC
  Administered 2024-10-27: 36000 [IU] via ORAL
  Filled 2024-10-25 (×6): qty 1

## 2024-10-25 MED ORDER — ACETAMINOPHEN 325 MG PO TABS
650.0000 mg | ORAL_TABLET | Freq: Four times a day (QID) | ORAL | Status: DC | PRN
  Administered 2024-10-26 – 2024-10-27 (×2): 650 mg via ORAL
  Filled 2024-10-25 (×2): qty 2

## 2024-10-25 MED ORDER — HYDROCHLOROTHIAZIDE 12.5 MG PO TABS
12.5000 mg | ORAL_TABLET | Freq: Every day | ORAL | Status: DC
  Administered 2024-10-25: 12.5 mg via ORAL
  Filled 2024-10-25 (×2): qty 1

## 2024-10-25 MED ORDER — FLUTICASONE FUROATE-VILANTEROL 200-25 MCG/ACT IN AEPB
1.0000 | INHALATION_SPRAY | Freq: Every day | RESPIRATORY_TRACT | Status: DC
  Administered 2024-10-26 – 2024-10-27 (×2): 1 via RESPIRATORY_TRACT
  Filled 2024-10-25: qty 28

## 2024-10-25 MED ORDER — PANTOPRAZOLE SODIUM 40 MG PO TBEC
40.0000 mg | DELAYED_RELEASE_TABLET | Freq: Every day | ORAL | Status: DC
  Administered 2024-10-25 – 2024-10-27 (×3): 40 mg via ORAL
  Filled 2024-10-25 (×3): qty 1

## 2024-10-25 MED ORDER — ACETAMINOPHEN 650 MG RE SUPP: 650.0000 mg | Freq: Four times a day (QID) | RECTAL | Status: DC | PRN

## 2024-10-25 MED ORDER — LORAZEPAM 1 MG PO TABS: 1.0000 mg | ORAL_TABLET | ORAL | Status: DC | PRN

## 2024-10-25 MED ORDER — LISINOPRIL 20 MG PO TABS
20.0000 mg | ORAL_TABLET | Freq: Every day | ORAL | Status: DC
  Administered 2024-10-25: 20 mg via ORAL
  Filled 2024-10-25 (×2): qty 1

## 2024-10-25 MED ORDER — THIAMINE MONONITRATE 100 MG PO TABS
100.0000 mg | ORAL_TABLET | Freq: Every day | ORAL | Status: DC
  Administered 2024-10-25 – 2024-10-27 (×3): 100 mg via ORAL
  Filled 2024-10-25 (×3): qty 1

## 2024-10-25 MED ORDER — THIAMINE HCL 100 MG/ML IJ SOLN: 100.0000 mg | Freq: Every day | INTRAMUSCULAR | Status: DC

## 2024-10-25 MED ORDER — ENOXAPARIN SODIUM 40 MG/0.4ML IJ SOSY: 40.0000 mg | PREFILLED_SYRINGE | INTRAMUSCULAR | Status: DC

## 2024-10-25 MED ORDER — ALBUTEROL SULFATE (2.5 MG/3ML) 0.083% IN NEBU: 3.0000 mL | INHALATION_SOLUTION | Freq: Four times a day (QID) | RESPIRATORY_TRACT | Status: DC | PRN

## 2024-10-25 MED ORDER — FOLIC ACID 1 MG PO TABS
1.0000 mg | ORAL_TABLET | Freq: Every day | ORAL | Status: DC
  Administered 2024-10-25 – 2024-10-27 (×3): 1 mg via ORAL
  Filled 2024-10-25 (×3): qty 1

## 2024-10-25 MED ORDER — HYDRALAZINE HCL 20 MG/ML IJ SOLN: 10.0000 mg | Freq: Four times a day (QID) | INTRAMUSCULAR | Status: DC | PRN

## 2024-10-25 MED ORDER — LISINOPRIL-HYDROCHLOROTHIAZIDE 20-12.5 MG PO TABS: 1.0000 | ORAL_TABLET | Freq: Every day | ORAL | Status: DC

## 2024-10-25 NOTE — ED Notes (Signed)
 Attempted report at this time to [facility]. RN unavailable at this time to take report due to other patient care. Transport in route at this time.

## 2024-10-25 NOTE — ED Notes (Signed)
 Pt ambulated to bathroom and back with supervision.  Gait steady. Fall bundle in place.

## 2024-10-25 NOTE — Consult Note (Signed)
 "   ELECTROPHYSIOLOGY CONSULT NOTE    Patient ID: Jane Riley MRN: [id number], DOB/AGE: August 23, 1951 74 y.o.  Admit date: 10/24/2024 Date of Consult: 10/25/2024  Primary Physician: Alla Amis, MD Primary Cardiologist: None  Electrophysiologist: New   Referring Provider: @ATTENDING @  Patient Profile: Jane Riley is a 74 y.o. female with a history of HTN, HLD, exocrine pancreatic insufficiency who is being seen today for the evaluation of CHB at the request of Dr. Jossie.  HPI:  Jane Riley is a 74 y.o. female with PMH as above who presented to ER after acute dizziness and found to be in CHB.   She was in her usual state of health yesterday, though does note some recent episodes of dizziness while on vacations in the mountains. The episodes resolved quickly and so she didn't think anything further about it.   In ER, labs notable for normal thyroid, K 3.4 (since repleted).  On interview, she feels well and without symptoms while laying down. She denies chest pain, chest pressure, palpitations. No SOB or dizziness currently.     Labs Potassium3.4* (01/19 2055) Magnesium  2.1 (01/19 2055) Creatinine, ser  0.90 (01/19 2055) PLT  210 (01/19 2055) HGB  14.0 (01/19 2055) WBC 4.9 (01/19 2055)  .    Past Medical History:  Diagnosis Date   Actinic keratosis    Arthritis    neck, hands   CRPS (complex regional pain syndrome)    Depression    Dysplastic nevus 12/07/2007   left post shoulder. Severe atypia. Excised: 01/17/2008, no residual neoplasm   Dysplastic nevus 12/22/2017   left buttock. Moderate atypia, limited margins free   Dysplastic nevus 05/06/2021   left shoulder. Moderate to severe. Shave removal  06/03/21   Family history of adverse reaction to anesthesia    mother nausea and vomiting   GERD (gastroesophageal reflux disease)    Hypercholesteremia    Hypertension    Osteoporosis    PONV (postoperative nausea and vomiting)      Surgical History:  Past  Surgical History:  Procedure Laterality Date   ABDOMINAL HYSTERECTOMY     CATARACT EXTRACTION W/PHACO Right 01/12/2017   Procedure: CATARACT EXTRACTION PHACO AND INTRAOCULAR LENS PLACEMENT (IOC) Right Restor Lens;  Surgeon: Donzell Arlyce Budd, MD;  Location: [facility];  Service: Ophthalmology;  Laterality: Right;  Restor Lens   CESAREAN SECTION     x2   CHOLECYSTECTOMY     COLONOSCOPY WITH PROPOFOL  N/A 05/10/2020   Procedure: COLONOSCOPY WITH PROPOFOL ;  Surgeon: Janalyn Keene NOVAK, MD;  Location: [facility];  Service: Endoscopy;  Laterality: N/A;   ESOPHAGOGASTRODUODENOSCOPY (EGD) WITH PROPOFOL  N/A 03/24/2018   Procedure: ESOPHAGOGASTRODUODENOSCOPY (EGD) WITH PROPOFOL ;  Surgeon: Janalyn Keene NOVAK, MD;  Location: [facility];  Service: Endoscopy;  Laterality: N/A;   ESOPHAGOGASTRODUODENOSCOPY (EGD) WITH PROPOFOL  N/A 05/10/2020   Procedure: ESOPHAGOGASTRODUODENOSCOPY (EGD) WITH PROPOFOL ;  Surgeon: Janalyn Keene NOVAK, MD;  Location: [facility];  Service: Endoscopy;  Laterality: N/A;   FLEXIBLE BRONCHOSCOPY N/A 05/27/2018   Procedure: FLEXIBLE BRONCHOSCOPY;  Surgeon: Verdia Art, MD;  Location: [facility] OR[facility];  Service: Pulmonary;  Laterality: N/A;   NECK SURGERY  2008   4 level fusion.  plates and screws   TONSILLECTOMY     WRIST SURGERY Left 2014   implant but unsure of the type     (Not in a hospital admission)   Inpatient Medications:   Allergies: Allergies[1]  Family History  Problem Relation Age of Onset   Breast cancer  Cousin    Breast cancer Cousin      Physical Exam: Vitals:   10/25/24 1100 10/25/24 1300 10/25/24 1454 10/25/24 1500  BP: (!) 169/48 (!) 138/96 (!) 145/48 (!) 146/56  Pulse: (!) 42 (!) 44 (!) 52 (!) 40  Resp: (!) 21 15 15 15   Temp: 98 F (36.7 C) 98.1 F (36.7 C) 98.4 F (36.9 C) 98.4 F (36.9 C)  TempSrc: Oral Oral  Oral  SpO2: 97% 94% 96% 96%  Weight:      Height:        GEN- NAD, A&O x 3, normal affect HEENT:  Normocephalic, atraumatic Lungs- CTAB, Normal effort.  Heart- Regular, bradycardic rate and rhythm, No M/G/R.  GI- Soft, NT, ND.  Extremities- No clubbing, cyanosis, or edema   Radiology/Studies: DG Chest Portable 1 View Result Date: 10/24/2024 EXAM: 1 VIEW XRAY OF THE CHEST 10/24/2024 09:30:00 PM COMPARISON: 03/11/2018 CLINICAL HISTORY: complete heart block FINDINGS: LUNGS AND PLEURA: No focal pulmonary opacity. No pleural effusion. No pneumothorax. HEART AND MEDIASTINUM: No acute abnormality of the cardiac and mediastinal silhouettes. BONES AND SOFT TISSUES: No acute osseous abnormality. IMPRESSION: 1. No acute process. Electronically signed by: Franky Crease MD 10/24/2024 09:32 PM EST RP Workstation: [id number]    EKG:10/24/2024 - CHB with wide QRS, rate 41 05/14/2009 EKG - SR with narrow QRS (personally reviewed)  TELEMETRY: initially CHB, now 2:1 in 40s (personally reviewed)  DEVICE HISTORY: none  Assessment/Plan: #) CHB #) dizziness Patient presented to ER after acute dizziness episode with ?LOC.  Found to be in intermittent CHB, with 2:1 HB. HDS Not on any AV nodal blocking agents PTA Normal thyroid function Meets PPM indication [facility] Ok to eat now Will order TTE to eval   #) HTN Permissive HTN at this time Hold home meds      Signed, Yurianna Tusing, NP  10/25/2024 3:27 PM    [1]  Allergies Allergen Reactions   Codeine Nausea And Vomiting and Nausea Only    itching   Doxycycline      Burning in throat/ GI irritation per patient    Adhesive [Tape] Other (See Comments)    Skin irritation.  Tegaderm OK Paper tape is okay   Amlodipine  Swelling    Foot swelling   Amoxicillin Rash    Caused skin blister on back of left leg Has patient had a PCN reaction causing immediate rash, facial/tongue/throat swelling, SOB or lightheadedness with hypotension: No Has patient had a PCN reaction causing severe rash involving mucus  membranes or skin necrosis: No Has patient had a PCN reaction that required hospitalization: No Has patient had a PCN reaction occurring within the last 10 years: No If all of the above answers are NO, then may proceed with Cephalosporin use.    Nsaids Other (See Comments)    GI upset   Other Itching    Narcotic pain medications- causes itching Other reaction(s): Other (See Comments) Skin irritation.  Tegaderm OK Paper tape is okay   Oxycodone Itching    Most pain medications cause itching per patient   Pravastatin Other (See Comments)    Body aches   "

## 2024-10-25 NOTE — H&P (Addendum)
 " History and Physical  Patient: Jane Riley FMW:[id number] DOB: 1951/04/22 DOA: 10/25/2024 DOS: the patient was seen and examined on 10/25/2024 Patient coming from: Home  Chief Complaint: No chief complaint on file.  HPI: Jane Riley is a 74 y.o. female with PMH significant of hypertension, hyperlipidemia, exocrine pancreatic insufficiency presented to [facility] ED with dizziness, lightheadedness, nausea in the evening before the admission.  No falls or syncope.  Patient reported symptoms as acute, felt acutely dizzy and lightheaded, nauseous, loss of balance with legs feeling wobbly.  She did not pass out.  Patient reported that she had similar symptom with the dizziness, shortness of breath on exertion, fatigue while on vacation in the mountains in September and then in November.  Those episodes resolved quickly and she did not seek medical attention.  ED course:  In ED, patient was found to have heart rate in 30s, highest 44, temp 98 F, RR 21, BP 169/48, O2 sats 97% on room air  EKG showed complete heart block with wide QRS, rate 41.  Cardiology was consulted, recommended transfer to [facility] for pacemaker placement   Review of Systems: As mentioned in the history of present illness. All other systems reviewed and are negative. Past Medical History:  Diagnosis Date   Actinic keratosis    Arthritis    neck, hands   CRPS (complex regional pain syndrome)    Depression    Dysplastic nevus 12/07/2007   left post shoulder. Severe atypia. Excised: 01/17/2008, no residual neoplasm   Dysplastic nevus 12/22/2017   left buttock. Moderate atypia, limited margins free   Dysplastic nevus 05/06/2021   left shoulder. Moderate to severe. Shave removal  06/03/21   Family history of adverse reaction to anesthesia    mother nausea and vomiting   GERD (gastroesophageal reflux disease)    Hypercholesteremia    Hypertension    Osteoporosis    PONV (postoperative nausea and vomiting)    Past Surgical  History:  Procedure Laterality Date   ABDOMINAL HYSTERECTOMY     CATARACT EXTRACTION W/PHACO Right 01/12/2017   Procedure: CATARACT EXTRACTION PHACO AND INTRAOCULAR LENS PLACEMENT (IOC) Right Restor Lens;  Surgeon: Donzell Arlyce Budd, MD;  Location: [facility];  Service: Ophthalmology;  Laterality: Right;  Restor Lens   CESAREAN SECTION     x2   CHOLECYSTECTOMY     COLONOSCOPY WITH PROPOFOL  N/A 05/10/2020   Procedure: COLONOSCOPY WITH PROPOFOL ;  Surgeon: Janalyn Keene NOVAK, MD;  Location: [facility];  Service: Endoscopy;  Laterality: N/A;   ESOPHAGOGASTRODUODENOSCOPY (EGD) WITH PROPOFOL  N/A 03/24/2018   Procedure: ESOPHAGOGASTRODUODENOSCOPY (EGD) WITH PROPOFOL ;  Surgeon: Janalyn Keene NOVAK, MD;  Location: [facility];  Service: Endoscopy;  Laterality: N/A;   ESOPHAGOGASTRODUODENOSCOPY (EGD) WITH PROPOFOL  N/A 05/10/2020   Procedure: ESOPHAGOGASTRODUODENOSCOPY (EGD) WITH PROPOFOL ;  Surgeon: Janalyn Keene NOVAK, MD;  Location: [facility];  Service: Endoscopy;  Laterality: N/A;   FLEXIBLE BRONCHOSCOPY N/A 05/27/2018   Procedure: FLEXIBLE BRONCHOSCOPY;  Surgeon: Verdia Art, MD;  Location: [facility];  Service: Pulmonary;  Laterality: N/A;   NECK SURGERY  2008   4 level fusion.  plates and screws   TONSILLECTOMY     WRIST SURGERY Left 2014   implant but unsure of the type   Social History:  reports that she quit smoking about 18 years ago. Her smoking use included cigarettes. She started smoking about 28 years ago. She has a 2.5 pack-year smoking history. She has never used smokeless tobacco. She reports current alcohol use of  about 7.0 standard drinks of alcohol per week. She reports that she does not use drugs. Allergies[1] Family History  Problem Relation Age of Onset   Breast cancer Cousin    Breast cancer Cousin    Prior to Admission medications  Medication Sig Start Date End Date Taking? Authorizing Provider  albuterol  (VENTOLIN  HFA) 108 (90 Base)  MCG/ACT inhaler Inhale 2 puffs into the lungs every 6 (six) hours as needed for wheezing or shortness of breath. 08/19/24   Kasa, Kurian, MD  Biotin 5000 MCG TABS Take 5,000 mcg by mouth daily.     [provider]  budesonide -formoterol  (SYMBICORT ) 160-4.5 MCG/ACT inhaler Inhale 2 puffs into the lungs 2 (two) times daily. 08/19/24   Kasa, Kurian, MD  busPIRone (BUSPAR) 5 MG tablet  03/18/23   [provider]  carboxymethylcellulose (REFRESH TEARS) 0.5 % SOLN Apply to eye.    [provider]  celecoxib (CELEBREX) 200 MG capsule Take 200 mg by mouth daily.    [provider]  Cholecalciferol (VITAMIN D3) 1000 units CAPS Take 1,000 Units by mouth daily.     [provider]  citalopram (CELEXA) 20 MG tablet Take 20 mg by mouth daily.     [provider]  clotrimazole (LOTRIMIN) 1 % cream Apply 1 Application topically 2 (two) times daily. 06/21/24 06/21/25  [provider]  Coenzyme Q10 (COQ10) 200 MG CAPS Take 200 mg by mouth daily.     [provider]  hydrocortisone  2.5 % cream Apply topically 2 (two) times daily as needed (Rash). once or twice a day for up to 2 weeks as needed for rash under breast 04/11/24   Jackquline Sawyer, MD  ketoconazole  (NIZORAL ) 2 % cream Apply 1 Application topically 2 (two) times daily. Bid to aa rash under breast prn flares 04/11/24 11/07/24  Stewart, Tara, MD  levocetirizine (XYZAL ) 5 MG tablet Take 1 tablet (5 mg total) by mouth every evening. 10/07/17   Ratcliffe, Heather R, PA-C  lipase/protease/amylase (CREON ) 36000 UNITS CPEP capsule Take 2 capsule with the first bite of each meal and 1 capsule with the first bite of each snack. 07/28/23   Unk Corinn Skiff, MD  lisinopril -hydrochlorothiazide  (ZESTORETIC ) 20-12.5 MG tablet Take by mouth. 09/01/23   [provider]  lovastatin (MEVACOR) 40 MG tablet Take by mouth. 10/04/21   [provider]  Magnesium Oxide 500 MG TABS Take 500 mg by mouth  daily with breakfast.     [provider]  methylPREDNISolone  (MEDROL  DOSEPAK) 4 MG TBPK tablet Take as directed in the package, this is a taper pack. 09/26/24   Tamea Dedra CROME, MD  metroNIDAZOLE  (METROCREAM ) 0.75 % cream Apply to affected areas once to twice daily for rosacea. 06/07/24   Jackquline Sawyer, MD  mupirocin  ointment (BACTROBAN ) 2 % Apply 1 Application topically 2 (two) times daily. bid to aa sores nose prn flares 06/07/24   Jackquline Sawyer, MD  nystatin  cream (MYCOSTATIN ) Apply 1 Application topically 2 (two) times daily. Patient taking differently: Apply 1 Application topically as needed. 04/29/23   [place], PA-C  Omega-3 Fatty Acids (KP FISH OIL) 1200 MG CAPS Take 1,200 mg by mouth daily.     [provider]  omeprazole  (PRILOSEC) 20 MG capsule Take 1 capsule (20 mg total) by mouth daily. 08/25/22   Unk Corinn Skiff, MD  predniSONE  (DELTASONE ) 10 MG tablet Take 1 tablet (10 mg total) by mouth daily with breakfast. 12/21/23   Kasa, Kurian, MD  raloxifene (EVISTA) 60  MG tablet Take 60 mg by mouth daily.    [provider]  vitamin E 400 UNIT capsule Take 400 Units by mouth daily.    [provider]   Physical Exam: Vitals:   10/25/24 1645  BP: (!) 176/52  Pulse: (!) 38  Resp: 16  Temp: 97.7 F (36.5 C)  TempSrc: Oral  SpO2: 97%  Weight: 69.9 kg  Height: 5' 4 (1.626 m)     General: Alert, awake, oriented x3, NAD Eyes: pink conjunctiva, anicteric sclera, PERLA HEENT: normocephalic, atraumatic, oropharynx clear Neck: supple, no masses or lymphadenopathy, no JVD CVS: Regular, bradycardia, no murmurs Resp : Clear to auscultation bilaterally, no wheezing, rales or rhonchi. GI : Soft, nontender, nondistended, positive bowel sounds. No hepatomegaly.  Ext: No lower extremity edema  Musculoskeletal: No clubbing or cyanosis, positive pedal pulses. No contracture. ROM intact  Neuro: Grossly intact, no focal neurological deficits, strength  5/5 upper and lower extremities bilaterally Psych: alert and oriented x 3, normal mood and affect Skin: no rashes or lesions, warm and dry   Data Reviewed: I have reviewed ED notes, Vitals, Lab results and outpatient records.   Recent Labs  Lab 10/24/24 2055  NA 139  K 3.4*  CL 100  CO2 23  GLUCOSE 91  BUN 14  CREATININE 0.90  CALCIUM 9.7  MG 2.1   Recent Labs  Lab 10/24/24 2055  WBC 4.9  NEUTROABS 2.8  HGB 14.0  HCT 40.9  MCV 91.5  PLT 210    Assessment and Plan Principal Problem: Postural dizziness with near syncope, complete heart block (HCC), symptomatic bradycardia -Presented to ED with acute dizziness, near syncopal episode.  EKG showed rate 41 with CHB, wide QRS. - TSH normal, not on any AV nodal blocking medications - EP cardiology has been consulted, patient was seen at [facility], transferred to [facility] for pacemaker placement.  Notified cardiology at [facility]. - Hold Celexa  - NPO. after midnight for pacemaker placement tomorrow - Obtain 2D echo   Active Problems:   Essential hypertension -On lisinopril  HCTZ, will resume -Will place on IV hydralazine  as needed with parameters     Gastroesophageal reflux disease without esophagitis -Continue Protonix     hyperlipidemia -On lovastatin outpatient, allergic to hospital formulary pravastatin    Exocrine pancreatic insufficiency -Continue Creon  with meals  Transaminitis -  hold statin - Patient does drink alcohol every day, AST>ALT pattern. - Recheck LFTs in a.m., if trending up, can further up workup with RUQ ultrasound, hepatitis panel.  Alcohol use - Patient reports that she drinks wine every day - Will place on CIWA's with Ativan , thiamine , folate, MVI.    Advance Care Planning:   Code Status: Full Code discussed with the patient Consults: Cardiology Family Communication: Husband at the bedside Severity of Illness:      The appropriate patient status for this patient is INPATIENT. Inpatient  status is judged to be reasonable and necessary in order to provide the required intensity of service to ensure the patient's safety. The patient's presenting symptoms, physical exam findings, and initial radiographic and laboratory data in the context of their chronic comorbidities is felt to place them at high risk for further clinical deterioration. Furthermore, it is not anticipated that the patient will be medically stable for discharge from the hospital within 2 midnights of admission.   * I certify that at the point of admission it is my clinical judgment that the patient will require inpatient hospital care spanning beyond 2 midnights  from the point of admission due to high intensity of service, high risk for further deterioration and high frequency of surveillance required.*    Author: Nydia Distance, MD 10/25/2024 6:14 PM For on call review [email or web address].       [1]  Allergies Allergen Reactions   Codeine Nausea And Vomiting and Nausea Only    itching   Doxycycline      Burning in throat/ GI irritation per patient    Adhesive [Tape] Other (See Comments)    Skin irritation.  Tegaderm OK Paper tape is okay   Amlodipine  Swelling    Foot swelling   Amoxicillin Rash    Caused skin blister on back of left leg Has patient had a PCN reaction causing immediate rash, facial/tongue/throat swelling, SOB or lightheadedness with hypotension: No Has patient had a PCN reaction causing severe rash involving mucus membranes or skin necrosis: No Has patient had a PCN reaction that required hospitalization: No Has patient had a PCN reaction occurring within the last 10 years: No If all of the above answers are NO, then may proceed with Cephalosporin use.    Nsaids Other (See Comments)    GI upset   Other Itching    Narcotic pain medications- causes itching Other reaction(s): Other (See Comments) Skin irritation.  Tegaderm OK Paper tape is okay   Oxycodone Itching    Most pain medications  cause itching per patient   Pravastatin Other (See Comments)    Body aches   "

## 2024-10-26 ENCOUNTER — Inpatient Hospital Stay (HOSPITAL_COMMUNITY)

## 2024-10-26 ENCOUNTER — Inpatient Hospital Stay (HOSPITAL_COMMUNITY): Admission: AD | Disposition: A | Payer: Self-pay | Source: Home / Self Care | Attending: Internal Medicine

## 2024-10-26 ENCOUNTER — Encounter (HOSPITAL_COMMUNITY): Payer: Self-pay | Admitting: Internal Medicine

## 2024-10-26 DIAGNOSIS — R001 Bradycardia, unspecified: Secondary | ICD-10-CM | POA: Diagnosis not present

## 2024-10-26 DIAGNOSIS — R9431 Abnormal electrocardiogram [ECG] [EKG]: Secondary | ICD-10-CM

## 2024-10-26 DIAGNOSIS — I442 Atrioventricular block, complete: Secondary | ICD-10-CM | POA: Diagnosis not present

## 2024-10-26 HISTORY — PX: PACEMAKER IMPLANT: EP1218

## 2024-10-26 LAB — CBC
HCT: 38.8 % (ref 36.0–46.0)
Hemoglobin: 12.9 g/dL (ref 12.0–15.0)
MCH: 30.8 pg (ref 26.0–34.0)
MCHC: 33.2 g/dL (ref 30.0–36.0)
MCV: 92.6 fL (ref 80.0–100.0)
Platelets: 192 K/uL (ref 150–400)
RBC: 4.19 MIL/uL (ref 3.87–5.11)
RDW: 13.4 % (ref 11.5–15.5)
WBC: 6.5 K/uL (ref 4.0–10.5)
nRBC: 0 % (ref 0.0–0.2)

## 2024-10-26 LAB — SURGICAL PCR SCREEN
MRSA, PCR: NEGATIVE
Staphylococcus aureus: NEGATIVE

## 2024-10-26 LAB — ECHOCARDIOGRAM COMPLETE
AR max vel: 1.27 cm2
AV Area VTI: 1.65 cm2
AV Area mean vel: 1.32 cm2
AV Mean grad: 12 mmHg
AV Peak grad: 26.7 mmHg
Ao pk vel: 2.59 m/s
Area-P 1/2: 3.65 cm2
Height: 64 in
MV M vel: 3.34 m/s
MV Peak grad: 44.6 mmHg
S' Lateral: 2.7 cm
Weight: 2464 [oz_av]

## 2024-10-26 LAB — COMPREHENSIVE METABOLIC PANEL WITH GFR
ALT: 88 U/L — ABNORMAL HIGH (ref 0–44)
AST: 51 U/L — ABNORMAL HIGH (ref 15–41)
Albumin: 4.2 g/dL (ref 3.5–5.0)
Alkaline Phosphatase: 49 U/L (ref 38–126)
Anion gap: 14 (ref 5–15)
BUN: 16 mg/dL (ref 8–23)
CO2: 23 mmol/L (ref 22–32)
Calcium: 9.8 mg/dL (ref 8.9–10.3)
Chloride: 103 mmol/L (ref 98–111)
Creatinine, Ser: 0.74 mg/dL (ref 0.44–1.00)
GFR, Estimated: >60 mL/min
Glucose, Bld: 118 mg/dL — ABNORMAL HIGH (ref 70–99)
Potassium: 3.6 mmol/L (ref 3.5–5.1)
Sodium: 140 mmol/L (ref 135–145)
Total Bilirubin: 0.4 mg/dL (ref 0.0–1.2)
Total Protein: 6.2 g/dL — ABNORMAL LOW (ref 6.5–8.1)

## 2024-10-26 MED ORDER — LIDOCAINE HCL (PF) 1 % IJ SOLN
INTRAMUSCULAR | Status: AC
  Filled 2024-10-26: qty 60

## 2024-10-26 MED ORDER — CHLORHEXIDINE GLUCONATE 4 % EX SOLN
60.0000 mL | Freq: Once | CUTANEOUS | Status: AC
  Administered 2024-10-26: 4 via TOPICAL
  Filled 2024-10-26: qty 60

## 2024-10-26 MED ORDER — SODIUM CHLORIDE 0.9 % IV SOLN
80.0000 mg | INTRAVENOUS | Status: AC
  Administered 2024-10-26: 80 mg

## 2024-10-26 MED ORDER — FENTANYL CITRATE (PF) 100 MCG/2ML IJ SOLN
INTRAMUSCULAR | Status: DC | PRN
  Administered 2024-10-26 (×2): 25 ug via INTRAVENOUS

## 2024-10-26 MED ORDER — FENTANYL CITRATE (PF) 100 MCG/2ML IJ SOLN
INTRAMUSCULAR | Status: AC
  Filled 2024-10-26: qty 2

## 2024-10-26 MED ORDER — SODIUM CHLORIDE 0.9 % IV SOLN: INTRAVENOUS | Status: DC

## 2024-10-26 MED ORDER — CEFAZOLIN SODIUM-DEXTROSE 2-4 GM/100ML-% IV SOLN: 2.0000 g | INTRAVENOUS | Status: AC

## 2024-10-26 MED ORDER — MIDAZOLAM HCL 2 MG/2ML IJ SOLN
INTRAMUSCULAR | Status: AC
  Filled 2024-10-26: qty 2

## 2024-10-26 MED ORDER — HEPARIN (PORCINE) IN NACL 1000-0.9 UT/500ML-% IV SOLN
INTRAVENOUS | Status: DC | PRN
  Administered 2024-10-26: 500 mL

## 2024-10-26 MED ORDER — LIDOCAINE HCL (PF) 1 % IJ SOLN
INTRAMUSCULAR | Status: DC | PRN
  Administered 2024-10-26: 40 mL

## 2024-10-26 MED ORDER — CEFAZOLIN SODIUM-DEXTROSE 2-4 GM/100ML-% IV SOLN
INTRAVENOUS | Status: AC
  Administered 2024-10-26: 2 g via INTRAVENOUS
  Filled 2024-10-26: qty 100

## 2024-10-26 MED ORDER — CHLORHEXIDINE GLUCONATE 4 % EX SOLN
CUTANEOUS | Status: AC
  Filled 2024-10-26: qty 45

## 2024-10-26 MED ORDER — CHLORHEXIDINE GLUCONATE 4 % EX SOLN
CUTANEOUS | Status: AC
  Filled 2024-10-26: qty 15

## 2024-10-26 MED ORDER — SODIUM CHLORIDE 0.9 % IV SOLN
INTRAVENOUS | Status: AC
  Filled 2024-10-26: qty 2

## 2024-10-26 MED ORDER — MIDAZOLAM HCL 5 MG/5ML IJ SOLN
INTRAMUSCULAR | Status: DC | PRN
  Administered 2024-10-26 (×2): 1 mg via INTRAVENOUS

## 2024-10-26 NOTE — Progress Notes (Signed)
 " PROGRESS NOTE  Jane Riley  FMW:[id number] DOB: 1951-02-07 DOA: 10/25/2024 PCP: Alla Amis, MD   Brief Narrative: Patient is a 74 year old female with history of hypertension, hyperlipidemia, exocrine pancreatic insufficiency who presented with dizziness, lightheadedness, nausea, loss of balance.  No history of loss of consciousness or fall.  On presentation she was bradycardic in the range of 30s.  Blood pressure was in the high range.  EKG showed complete heart block with wide QRS, rate was 41.  Cardiology consulted.  Patient was transferred to [facility] for consideration of pacemaker placement.  EP following  Assessment & Plan:  Principal Problem:   Complete heart block (HCC) Active Problems:   Essential hypertension   Gastroesophageal reflux disease without esophagitis   Mixed hyperlipidemia   Exocrine pancreatic insufficiency   Symptomatic bradycardia   Postural dizziness with near syncope   Complete heart block/bradycardia: Presented with dizziness, near syncope.  TSH is normal.  Patient was transferred from [facility] for consideration of pacemaker placement.  Cardiology following.  Hypertension: On lisinopril , hydrochlorothiazide .  GERD: Continue Protonix   Hyperlipidemia: On statin  History of exocrine pancreatic insufficiency: Creon   Transaminitis: Holding statin.  Liver enzymes significantly improved.        DVT prophylaxis:Place and maintain sequential compression device Start: 10/25/24 1826     Code Status: Full Code  Family Communication: None at bedside  Patient status:Inpatient  Patient is from :Home  Anticipated discharge un:Ynfz  Estimated DC date:after full work up   Consultants: Cardiology  Procedures:None yet  Antimicrobials:  Anti-infectives (From admission, onward)    Start     Dose/Rate Route Frequency Ordered Stop   10/26/24 1400  gentamicin  (GARAMYCIN ) 80 mg in sodium chloride  0.9 % 500 mL irrigation        80 mg Irrigation On  call 10/26/24 0436 10/27/24 1400   10/26/24 1400  ceFAZolin  (ANCEF ) IVPB 2g/100 mL premix        2 g 200 mL/hr over 30 Minutes Intravenous On call 10/26/24 0436 10/27/24 1400       Subjective: Patient seen and examined at bedside today.  Lying in bed.  Heart rate in the range of 30s.  Denies any chest pain, shortness of breath or dizziness while on bed.  Objective: Vitals:   10/26/24 0053 10/26/24 0054 10/26/24 0549 10/26/24 0812  BP:  (!) 175/66 (!) 151/53 (!) 148/47  Pulse: 97 92 (!) 36 (!) 49  Resp: 16 16  18   Temp: (!) 97.4 F (36.3 C) (!) 97.4 F (36.3 C) 98.1 F (36.7 C) 98.4 F (36.9 C)  TempSrc: Oral Oral Oral Oral  SpO2:  97% 98%   Weight:      Height:        Intake/Output Summary (Last 24 hours) at 10/26/2024 9161 Last data filed at 10/26/2024 0054 Gross per 24 hour  Intake 0 ml  Output --  Net 0 ml   Filed Weights   10/25/24 1645  Weight: 69.9 kg    Examination:  General exam: Overall comfortable, not in distress HEENT: PERRL Respiratory system:  no wheezes or crackles  Cardiovascular system: bradycardia Gastrointestinal system: Abdomen is nondistended, soft and nontender. Central nervous system: Alert and oriented Extremities: No edema, no clubbing ,no cyanosis Skin: No rashes, no ulcers,no icterus     Data Reviewed: I have personally reviewed following labs and imaging studies  CBC: Recent Labs  Lab 10/24/24 2055 10/26/24 0440  WBC 4.9 6.5  NEUTROABS 2.8  --   HGB 14.0  12.9  HCT 40.9 38.8  MCV 91.5 92.6  PLT 210 192   Basic Metabolic Panel: Recent Labs  Lab 10/24/24 2055 10/26/24 0440  NA 139 140  K 3.4* 3.6  CL 100 103  CO2 23 23  GLUCOSE 91 118*  BUN 14 16  CREATININE 0.90 0.74  CALCIUM 9.7 9.8  MG 2.1  --      Recent Results (from the past 240 hours)  Resp panel by RT-PCR (RSV, Flu A&B, Covid) Anterior Nasal Swab     Status: None   Collection Time: 10/24/24  8:53 PM   Specimen: Anterior Nasal Swab  Result Value Ref  Range Status   SARS Coronavirus 2 by RT PCR NEGATIVE NEGATIVE Final    Comment: (NOTE) SARS-CoV-2 target nucleic acids are NOT DETECTED.  The SARS-CoV-2 RNA is generally detectable in upper respiratory specimens during the acute phase of infection. The lowest concentration of SARS-CoV-2 viral copies this assay can detect is 138 copies/mL. A negative result does not preclude SARS-Cov-2 infection and should not be used as the sole basis for treatment or other patient management decisions. A negative result may occur with  improper specimen collection/handling, submission of specimen other than nasopharyngeal swab, presence of viral mutation(s) within the areas targeted by this assay, and inadequate number of viral copies(<138 copies/mL). A negative result must be combined with clinical observations, patient history, and epidemiological information. The expected result is Negative.  Fact Sheet for Patients:  bloggercourse.com  Fact Sheet for Healthcare Providers:  seriousbroker.it  This test is no t yet approved or cleared by the [place]  FDA and  has been authorized for detection and/or diagnosis of SARS-CoV-2 by FDA under an Emergency Use Authorization (EUA). This EUA will remain  in effect (meaning this test can be used) for the duration of the COVID-19 declaration under Section 564(b)(1) of the Act, 21 U.S.C.section 360bbb-3(b)(1), unless the authorization is terminated  or revoked sooner.       Influenza A by PCR NEGATIVE NEGATIVE Final   Influenza B by PCR NEGATIVE NEGATIVE Final    Comment: (NOTE) The Xpert Xpress SARS-CoV-2/FLU/RSV plus assay is intended as an aid in the diagnosis of influenza from Nasopharyngeal swab specimens and should not be used as a sole basis for treatment. Nasal washings and aspirates are unacceptable for Xpert Xpress SARS-CoV-2/FLU/RSV testing.  Fact Sheet for  Patients: bloggercourse.com  Fact Sheet for Healthcare Providers: seriousbroker.it  This test is not yet approved or cleared by the [place]  FDA and has been authorized for detection and/or diagnosis of SARS-CoV-2 by FDA under an Emergency Use Authorization (EUA). This EUA will remain in effect (meaning this test can be used) for the duration of the COVID-19 declaration under Section 564(b)(1) of the Act, 21 U.S.C. section 360bbb-3(b)(1), unless the authorization is terminated or revoked.     Resp Syncytial Virus by PCR NEGATIVE NEGATIVE Final    Comment: (NOTE) Fact Sheet for Patients: bloggercourse.com  Fact Sheet for Healthcare Providers: seriousbroker.it  This test is not yet approved or cleared by the [place]  FDA and has been authorized for detection and/or diagnosis of SARS-CoV-2 by FDA under an Emergency Use Authorization (EUA). This EUA will remain in effect (meaning this test can be used) for the duration of the COVID-19 declaration under Section 564(b)(1) of the Act, 21 U.S.C. section 360bbb-3(b)(1), unless the authorization is terminated or revoked.  Performed at [facility], [address] [place], [place] [address]   Surgical PCR screen  Status: None   Collection Time: 10/26/24  4:37 AM   Specimen: Nasal Mucosa; Nasal Swab  Result Value Ref Range Status   MRSA, PCR NEGATIVE NEGATIVE Final   Staphylococcus aureus NEGATIVE NEGATIVE Final    Comment: (NOTE) The Xpert SA Assay (FDA approved for NASAL specimens in patients 12 years of age and older), is one component of a comprehensive surveillance program. It is not intended to diagnose infection nor to guide or monitor treatment. Performed at [facility], [address], [place], [place] [address]      Radiology Studies: DG Chest Portable 1 View Result Date: 10/24/2024 EXAM: 1  VIEW XRAY OF THE CHEST 10/24/2024 09:30:00 PM COMPARISON: 03/11/2018 CLINICAL HISTORY: complete heart block FINDINGS: LUNGS AND PLEURA: No focal pulmonary opacity. No pleural effusion. No pneumothorax. HEART AND MEDIASTINUM: No acute abnormality of the cardiac and mediastinal silhouettes. BONES AND SOFT TISSUES: No acute osseous abnormality. IMPRESSION: 1. No acute process. Electronically signed by: Kevin Dover MD 10/24/2024 09:32 PM EST RP Workstation: [id number]    Scheduled Meds:  chlorhexidine        chlorhexidine        fluticasone  furoate-vilanterol  1 puff Inhalation Daily   folic acid   1 mg Oral Daily   gentamicin  (GARAMYCIN ) 80 mg in sodium chloride  0.9 % 500 mL irrigation  80 mg Irrigation On Call   lisinopril   20 mg Oral Daily   And   hydrochlorothiazide   12.5 mg Oral Daily   lipase/protease/amylase  36,000 Units Oral TID WC   multivitamin with minerals  1 tablet Oral Daily   pantoprazole   40 mg Oral Daily   thiamine   100 mg Oral Daily   Or   thiamine   100 mg Intravenous Daily   Continuous Infusions:  sodium chloride      sodium chloride  50 mL/hr at 10/26/24 0547    ceFAZolin  (ANCEF ) IV       LOS: 1 day   Ivonne Mustache, MD [facility] P1/21/2026, 8:38 AM  "

## 2024-10-26 NOTE — Progress Notes (Signed)
 "  Rounding Note   Patient Name: Jane Riley Date of Encounter: 10/26/2024  [facility] Cardiologist: Dr. Debera remotely  Subjective  Weak, sluggish, not lightheaded in bed, no CP  Scheduled Meds:  chlorhexidine        chlorhexidine        fluticasone  furoate-vilanterol  1 puff Inhalation Daily   folic acid   1 mg Oral Daily   gentamicin  (GARAMYCIN ) 80 mg in sodium chloride  0.9 % 500 mL irrigation  80 mg Irrigation On Call   lisinopril   20 mg Oral Daily   And   hydrochlorothiazide   12.5 mg Oral Daily   lipase/protease/amylase  36,000 Units Oral TID WC   multivitamin with minerals  1 tablet Oral Daily   pantoprazole   40 mg Oral Daily   thiamine   100 mg Oral Daily   Or   thiamine   100 mg Intravenous Daily   Continuous Infusions:  sodium chloride      sodium chloride  50 mL/hr at 10/26/24 0547    ceFAZolin  (ANCEF ) IV     PRN Meds: acetaminophen  **OR** acetaminophen , albuterol , chlorhexidine , chlorhexidine , hydrALAZINE    Vital Signs  Vitals:   10/26/24 0053 10/26/24 0054 10/26/24 0549 10/26/24 0812  BP:  (!) 175/66 (!) 151/53 (!) 148/47  Pulse: 97 92 (!) 36 (!) 49  Resp: 16 16  18   Temp: (!) 97.4 F (36.3 C) (!) 97.4 F (36.3 C) 98.1 F (36.7 C) 98.4 F (36.9 C)  TempSrc: Oral Oral Oral Oral  SpO2:  97% 98%   Weight:      Height:        Intake/Output Summary (Last 24 hours) at 10/26/2024 0910 Last data filed at 10/26/2024 0054 Gross per 24 hour  Intake 0 ml  Output --  Net 0 ml      10/25/2024    4:45 PM 10/24/2024    8:48 PM 08/19/2024   10:46 AM  Last 3 Weights  Weight (lbs) 154 lb 152 lb 154 lb 3.2 oz  Weight (kg) 69.854 kg 68.947 kg 69.945 kg      Telemetry  CHB high 30's-40's, alternating BBB - Personally Reviewed  ECG   No new EKGs - Personally Reviewed  Physical Exam  GEN: No acute distress.   Neck: No JVD Cardiac: RRR, bradycardic, no murmurs, rubs, or gallops.  Respiratory: CTA b/l. GI: Soft, nontender, non-distended  MS:  No edema; No deformity. Neuro:  Nonfocal  Psych: Normal affect   Labs High Sensitivity Troponin:  No results for input(s): TROPONINIHS in the last 720 hours.  Recent Labs  Lab 10/24/24 2055 10/24/24 2237  TRNPT <15 17       Chemistry Recent Labs  Lab 10/24/24 2055 10/26/24 0440  NA 139 140  K 3.4* 3.6  CL 100 103  CO2 23 23  GLUCOSE 91 118*  BUN 14 16  CREATININE 0.90 0.74  CALCIUM 9.7 9.8  MG 2.1  --   PROT 7.2 6.2*  ALBUMIN 5.0 4.2  AST 176* 51*  ALT 157* 88*  ALKPHOS 59 49  BILITOT 0.2 0.4  GFRNONAA >60 >60  ANIONGAP 16* 14    Lipids No results for input(s): CHOL, TRIG, HDL, LABVLDL, LDLCALC, CHOLHDL in the last 168 hours.  Hematology Recent Labs  Lab 10/24/24 2055 10/26/24 0440  WBC 4.9 6.5  RBC 4.47 4.19  HGB 14.0 12.9  HCT 40.9 38.8  MCV 91.5 92.6  MCH 31.3 30.8  MCHC 34.2 33.2  RDW 13.2 13.4  PLT 210 192  Thyroid  Recent Labs  Lab 10/24/24 2055  TSH 9.430*  FREET4 1.15    BNPNo results for input(s): BNP, PROBNP in the last 168 hours.  DDimer No results for input(s): DDIMER in the last 168 hours.   Radiology  DG Chest Portable 1 View Result Date: 10/24/2024 EXAM: 1 VIEW XRAY OF THE CHEST 10/24/2024 09:30:00 PM COMPARISON: 03/11/2018 CLINICAL HISTORY: complete heart block FINDINGS: LUNGS AND PLEURA: No focal pulmonary opacity. No pleural effusion. No pneumothorax. HEART AND MEDIASTINUM: No acute abnormality of the cardiac and mediastinal silhouettes. BONES AND SOFT TISSUES: No acute osseous abnormality. IMPRESSION: 1. No acute process. Electronically signed by: Franky Crease MD 10/24/2024 09:32 PM EST RP Workstation: [id number]    Cardiac Studies  Echo ordered > pending  Patient Profile   74 y.o. female w/PMHx of HTN, HLD, exocrine pancreatic insufficiency   Admitted to [facility] with c/o recent dizziness that was new and acute worsening of dizziness, found in CHB, EP saw her, with no reversible causes, transferred to [facility] for  further management/PPM implant   Assessment & Plan   CHB She feels weak, sluggish, not dizzy, near syncopal, has not had syncope BP stable Planned for PPM today  Dr. Nancey has been bedside (husband bedside as well) Revisited implant procedure, discussed possible risks/benefits Patient remains agreeable Clears until 1000 > NPO  HTN Stable BP Hold meds this AM until after implant  For questions or updates, please contact [facility] Please consult [email or web address] for contact info under    Signed, Charlies Macario Arthur, PA-C  10/26/2024, 9:10 AM    "

## 2024-10-26 NOTE — Plan of Care (Signed)
  Problem: Clinical Measurements: Goal: Cardiovascular complication will be avoided Outcome: Progressing   Problem: Activity: Goal: Risk for activity intolerance will decrease Outcome: Progressing   Problem: Pain Managment: Goal: General experience of comfort will improve and/or be controlled Outcome: Progressing   Problem: Safety: Goal: Ability to remain free from injury will improve Outcome: Progressing

## 2024-10-26 NOTE — TOC Initial Note (Signed)
 Transition of Care [facility]) - Initial/Assessment Note    Patient Details  Name: Jane Riley MRN: [id number] Date of Birth: 30-Oct-1950  Transition of Care [facility]) CM/SW Contact:    Isaiah Public, LCSWA Phone Number: 10/26/2024, 4:45 PM  Clinical Narrative:                  CSW spoke with patient. Patient reports PTA she comes from home with spouse. Patient reports plan at dc is to return back home. Patient reports she has a PCP Alda Carpen, MD. [facility] [place]. CSW offered patient outpatient substance use treatment services resources. Patient politely declined. All questions answered. No further questions reported at this time.  Expected Discharge Plan: Home/Self Care Barriers to Discharge: Continued Medical Work up   Patient Goals and CMS Choice Patient states their goals for this hospitalization and ongoing recovery are:: to return home          Expected Discharge Plan and Services In-house Referral: Clinical Social Work     Living arrangements for the past 2 months: Single Family Home                                      Prior Living Arrangements/Services Living arrangements for the past 2 months: Single Family Home Lives with:: Spouse   Do you feel safe going back to the place where you live?: Yes               Activities of Daily Living   ADL Screening (condition at time of admission) Independently performs ADLs?: Yes (appropriate for developmental age) Is the patient deaf or have difficulty hearing?: No Does the patient have difficulty seeing, even when wearing glasses/contacts?: No Does the patient have difficulty concentrating, remembering, or making decisions?: No  Permission Sought/Granted                  Emotional Assessment   Attitude/Demeanor/Rapport: Gracious Affect (typically observed): Calm Orientation: : Oriented to Self, Oriented to Place, Oriented to  Time, Oriented to  Situation Alcohol / Substance Use: Other (comment) (CSW offered patient outpatient subtance use treatment services resources. 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   DDD (degenerative disc disease), cervical 08/04/2019   Osteopenia of neck of left femur 06/22/2018   Esophageal dysphagia    Feline esophagus    Nuclear sclerotic cataract of left eye 06/25/2017   Presbyopia 06/25/2017   Shingles 05/28/2017   Anxiety 05/20/2017   Essential hypertension 05/20/2017   Gastroesophageal reflux disease without esophagitis 05/20/2017   Mixed hyperlipidemia 05/20/2017   Allergic rhinitis 12/15/2016   Vaccine counseling 01/07/2016   Cervical post-laminectomy syndrome 08/03/2014   PCP:  Carpen Alda, MD Pharmacy:   [facility] - [place], [place] - [address] [place] [address] Phone: [phone] Fax: [phone]  [facility] - [place], [place] - [id number], [address] [phone], [address], [address] [place]  [place] [address] Phone: [phone] Fax: [phone]     Social Drivers of Health (SDOH) Social History: SDOH Screenings   Food Insecurity: No Food Insecurity (10/25/2024)  Housing: Low Risk (10/25/2024)  Transportation Needs: No Transportation Needs (10/25/2024)  Utilities: Not At Risk (10/25/2024)  Financial Resource Strain: Low Risk  (08/30/2024)   Received from [facility]  Social Connections: Moderately Integrated  (10/26/2024)  Tobacco Use: Medium Risk (10/24/2024)   SDOH Interventions:     Readmission Risk Interventions     No data to display

## 2024-10-27 ENCOUNTER — Encounter (HOSPITAL_COMMUNITY): Payer: Self-pay | Admitting: Cardiovascular Disease

## 2024-10-27 ENCOUNTER — Inpatient Hospital Stay (HOSPITAL_COMMUNITY)

## 2024-10-27 DIAGNOSIS — I442 Atrioventricular block, complete: Secondary | ICD-10-CM | POA: Diagnosis not present

## 2024-10-27 LAB — COMPREHENSIVE METABOLIC PANEL WITH GFR
ALT: 60 U/L — ABNORMAL HIGH (ref 0–44)
AST: 35 U/L (ref 15–41)
Albumin: 4 g/dL (ref 3.5–5.0)
Alkaline Phosphatase: 47 U/L (ref 38–126)
Anion gap: 15 (ref 5–15)
BUN: 11 mg/dL (ref 8–23)
CO2: 21 mmol/L — ABNORMAL LOW (ref 22–32)
Calcium: 8.8 mg/dL — ABNORMAL LOW (ref 8.9–10.3)
Chloride: 105 mmol/L (ref 98–111)
Creatinine, Ser: 0.76 mg/dL (ref 0.44–1.00)
GFR, Estimated: >60 mL/min
Glucose, Bld: 108 mg/dL — ABNORMAL HIGH (ref 70–99)
Potassium: 3.7 mmol/L (ref 3.5–5.1)
Sodium: 141 mmol/L (ref 135–145)
Total Bilirubin: 0.5 mg/dL (ref 0.0–1.2)
Total Protein: 5.8 g/dL — ABNORMAL LOW (ref 6.5–8.1)

## 2024-10-27 MED ORDER — HYDROCHLOROTHIAZIDE 12.5 MG PO TABS
18.7500 mg | ORAL_TABLET | Freq: Every day | ORAL | Status: DC
  Administered 2024-10-27: 18.75 mg via ORAL
  Filled 2024-10-27: qty 2

## 2024-10-27 MED ORDER — LISINOPRIL-HYDROCHLOROTHIAZIDE 20-12.5 MG PO TABS: 1.5000 | ORAL_TABLET | Freq: Every day | ORAL | Status: DC

## 2024-10-27 MED ORDER — LISINOPRIL 20 MG PO TABS
30.0000 mg | ORAL_TABLET | Freq: Every day | ORAL | Status: DC
  Administered 2024-10-27: 30 mg via ORAL
  Filled 2024-10-27: qty 1

## 2024-10-27 MED ORDER — HYDROMORPHONE HCL 1 MG/ML IJ SOLN: 0.5000 mg | INTRAMUSCULAR | Status: DC | PRN

## 2024-10-27 MED ORDER — TRAMADOL HCL 50 MG PO TABS: 50.0000 mg | ORAL_TABLET | Freq: Four times a day (QID) | ORAL | Status: DC | PRN

## 2024-10-27 MED FILL — Midazolam HCl Inj 2 MG/2ML (Base Equivalent): INTRAMUSCULAR | Qty: 2 | Status: AC

## 2024-10-27 NOTE — Progress Notes (Signed)
 "  Rounding Note   Patient Name: Jane Riley Date of Encounter: 10/27/2024  [facility] Cardiologist: Dr. Debera remotely  Subjective  Feels much better, achy at implant site, though much better after acetaminophen .  Scheduled Meds:  fluticasone  furoate-vilanterol  1 puff Inhalation Daily   folic acid   1 mg Oral Daily   lipase/protease/amylase  36,000 Units Oral TID WC   multivitamin with minerals  1 tablet Oral Daily   pantoprazole   40 mg Oral Daily   thiamine   100 mg Oral Daily   Or   thiamine   100 mg Intravenous Daily   Continuous Infusions:   PRN Meds: acetaminophen  **OR** acetaminophen , albuterol , hydrALAZINE    Vital Signs  Vitals:   10/26/24 2130 10/27/24 0052 10/27/24 0358 10/27/24 0749  BP: (!) 150/71 (!) 156/72 (!) 157/74 (!) 159/73  Pulse: 87 84 77   Resp:   18 18  Temp:  97.7 F (36.5 C) 98.1 F (36.7 C) 98 F (36.7 C)  TempSrc:  Oral Oral Oral  SpO2: 95% 97% 93% 95%  Weight:      Height:        Intake/Output Summary (Last 24 hours) at 10/27/2024 0925 Last data filed at 10/27/2024 0359 Gross per 24 hour  Intake 931.2 ml  Output --  Net 931.2 ml      10/25/2024    4:45 PM 10/24/2024    8:48 PM 08/19/2024   10:46 AM  Last 3 Weights  Weight (lbs) 154 lb 152 lb 154 lb 3.2 oz  Weight (kg) 69.854 kg 68.947 kg 69.945 kg      Telemetry  SR, V paced- Personally Reviewed  ECG   SR, V paced, PAC - Personally Reviewed (hard copy in the chart/is not in Epic)  Physical Exam  GEN: No acute distress.   Neck: No JVD Cardiac: RRR, bradycardic, no murmurs, rubs, or gallops.  Respiratory: CTA b/l. GI: Soft, nontender, non-distended  MS: No edema; No deformity. Neuro:  Nonfocal  Psych: Normal affect   Implant site is stable, no bleeding, hematoma  Labs High Sensitivity Troponin:  No results for input(s): TROPONINIHS in the last 720 hours.  Recent Labs  Lab 10/24/24 2055 10/24/24 2237  TRNPT <15 17       Chemistry Recent Labs   Lab 10/24/24 2055 10/26/24 0440 10/27/24 0350  NA 139 140 141  K 3.4* 3.6 3.7  CL 100 103 105  CO2 23 23 21*  GLUCOSE 91 118* 108*  BUN 14 16 11   CREATININE 0.90 0.74 0.76  CALCIUM 9.7 9.8 8.8*  MG 2.1  --   --   PROT 7.2 6.2* 5.8*  ALBUMIN 5.0 4.2 4.0  AST 176* 51* 35  ALT 157* 88* 60*  ALKPHOS 59 49 47  BILITOT 0.2 0.4 0.5  GFRNONAA >60 >60 >60  ANIONGAP 16* 14 15    Lipids No results for input(s): CHOL, TRIG, HDL, LABVLDL, LDLCALC, CHOLHDL in the last 168 hours.  Hematology Recent Labs  Lab 10/24/24 2055 10/26/24 0440  WBC 4.9 6.5  RBC 4.47 4.19  HGB 14.0 12.9  HCT 40.9 38.8  MCV 91.5 92.6  MCH 31.3 30.8  MCHC 34.2 33.2  RDW 13.2 13.4  PLT 210 192   Thyroid  Recent Labs  Lab 10/24/24 2055  TSH 9.430*  FREET4 1.15    BNPNo results for input(s): BNP, PROBNP in the last 168 hours.  DDimer No results for input(s): DDIMER in the last 168 hours.   Radiology  DG Chest 2 View Result Date: 10/27/2024 EXAM: 2 VIEW(S) XRAY OF THE CHEST 10/27/2024 06:59:00 AM COMPARISON: 10/24/2024 CLINICAL HISTORY: 74 year old female. Postsurgical cardiac pacemaker in situ. FINDINGS: LINES, TUBES AND DEVICES: Left chest dual-chamber pacemaker with leads in right atrium and right ventricle. Intact leads. LUNGS AND PLEURA: No focal pulmonary opacity. No pleural effusion. No pneumothorax. HEART AND MEDIASTINUM: No acute abnormality of the cardiac and mediastinal silhouettes. BONES AND SOFT TISSUES: Cervical spine fusion hardware noted. No acute osseous abnormality. IMPRESSION: 1. New left chest cardiac pacemaker. No acute cardiopulmonary abnormality. Electronically signed by: Helayne Hurst MD 10/27/2024 07:09 AM EST RP Workstation: [id number]      Cardiac Studies  10/26/24: TTE 1. Left ventricular ejection fraction, by estimation, is 60 to 65%. The  left ventricle has normal function. Left ventricular endocardial border  not optimally defined to evaluate regional wall  motion. There is mild  concentric left ventricular  hypertrophy. Left ventricular diastolic parameters were normal.   2. Right ventricular systolic function is normal. The right ventricular  size is normal.   3. A small pericardial effusion is present. The pericardial effusion is  anterior to the right ventricle. There is no evidence of cardiac  tamponade.   4. The mitral valve is normal in structure. Trivial mitral valve  regurgitation. At risk for mitral stenosis mitral stenosis. The mean  mitral valve gradient is 4.0 mmHg.   5. The aortic valve is normal in structure. Aortic valve regurgitation is  mild. Mild aortic valve stenosis. Aortic valve area, by VTI measures 1.65  cm. Aortic valve mean gradient measures 12.0 mmHg. Aortic valve Vmax  measures 2.58 m/s.   Patient Profile   74 y.o. female w/PMHx of HTN, HLD, exocrine pancreatic insufficiency   Admitted to [facility] with c/o recent dizziness that was new and acute worsening of dizziness, found in CHB, EP saw her, with no reversible causes, transferred to [facility] for further management/PPM implant   Assessment & Plan   CHB S/p PPM yesterday Site is stable Device check this morning with stable measurements CXR this morning with stable lead position with no PTX Wound care and activity restrictions were reviewed with the patient EP follow up is in place  HTN Home Zestril  ordered   Dr. Nancey has been bedside OK to discharge from EP perspective We will sign off, though remain available.  Please recall if needed    For questions or updates, please contact [facility] Please consult [email or web address] for contact info under    Signed, Charlies Macario Arthur, PA-C  10/27/2024, 9:25 AM    "

## 2024-10-27 NOTE — Discharge Summary (Signed)
 Physician Discharge Summary  Jane Riley FMW:[id number] DOB: [birth year] DOA: 10/25/2024  PCP: Alla Amis, MD  Admit date: 10/25/2024 Discharge date: 10/27/2024  Admitted From: Home Disposition:  Home  Discharge Condition:Stable CODE STATUS:FULL Diet recommendation:  Regular  Brief/Interim Summary: Patient is a 74 year old female with history of hypertension, hyperlipidemia, exocrine pancreatic insufficiency who presented with dizziness, lightheadedness, nausea, loss of balance.  No history of loss of consciousness or fall.  On presentation she was bradycardic in the range of 30s.  Blood pressure was in the high range.  EKG showed complete heart block with wide QRS, rate was 41.  Cardiology consulted.  Patient was transferred to [facility] for consideration of pacemaker placement. EP was consulted and she underwent PPM placement on 1/21.  Remains hemodynamically stable today.  EP cleared for discharge.  Following problems were addressed during the hospitalization:  Complete heart block/bradycardia: Presented with dizziness, near syncope.  Patient was transferred from [facility] for consideration of pacemaker placement.  EP was consulted and she underwent PPM placement on 1/21.  Remains hemodynamically stable today.  EP cleared for discharge.   Hypertension: On lisinopril , hydrochlorothiazide .Resume on dc   GERD: Continue PPI   Hyperlipidemia: On statin   History of exocrine pancreatic insufficiency: Creon    Transaminitis: Holding statin.  Liver enzymes significantly improved.Can resume statin on dc         Discharge Diagnoses:  Principal Problem:   Complete heart block (HCC) Active Problems:   Essential hypertension   Gastroesophageal reflux disease without esophagitis   Mixed hyperlipidemia   Exocrine pancreatic insufficiency   Symptomatic bradycardia   Postural dizziness with near syncope    Discharge Instructions  Discharge Instructions     Diet general   Complete  by: As directed    Discharge instructions   Complete by: As directed    1)Please take your medications as instructed 2)Follow up with your PCP in a week 3)You will be called by cardiology for follow up appointment   Increase activity slowly   Complete by: As directed    No wound care   Complete by: As directed       Allergies as of 10/27/2024       Reactions   Codeine Nausea And Vomiting, Nausea Only   itching   Doxycycline     Burning in throat/ GI irritation per patient    Adhesive [tape] Other (See Comments)   Skin irritation.  Tegaderm OK Paper tape is okay   Amlodipine  Swelling   Foot swelling   Amoxicillin Rash   Caused skin blister on back of left leg Has patient had a PCN reaction causing immediate rash, facial/tongue/throat swelling, SOB or lightheadedness with hypotension: No Has patient had a PCN reaction causing severe rash involving mucus membranes or skin necrosis: No Has patient had a PCN reaction that required hospitalization: No Has patient had a PCN reaction occurring within the last 10 years: No If all of the above answers are NO, then may proceed with Cephalosporin use.   Nsaids Other (See Comments)   GI upset   Other Itching   Narcotic pain medications- causes itching Other reaction(s): Other (See Comments) Skin irritation.  Tegaderm OK Paper tape is okay   Oxycodone Itching   Most pain medications cause itching per patient   Penicillins Itching, Rash   Pravastatin Other (See Comments)   Body aches        Medication List     STOP taking these medications    azithromycin  250  MG tablet Commonly known as: ZITHROMAX    methylPREDNISolone  4 MG Tbpk tablet Commonly known as: MEDROL  DOSEPAK       TAKE these medications    albuterol  108 (90 Base) MCG/ACT inhaler Commonly known as: VENTOLIN  HFA Inhale 2 puffs into the lungs every 6 (six) hours as needed for wheezing or shortness of breath.   B-12 PO Take 1 tablet by mouth daily.    Biotin 5000 MCG Tabs Take 5,000 mcg by mouth daily.   budesonide -formoterol  160-4.5 MCG/ACT inhaler Commonly known as: Symbicort  Inhale 2 puffs into the lungs 2 (two) times daily. What changed:  when to take this reasons to take this   busPIRone 5 MG tablet Commonly known as: BUSPAR Take 5 mg by mouth 2 (two) times daily.   celecoxib 200 MG capsule Commonly known as: CELEBREX Take 200 mg by mouth daily as needed (joint/back pain).   citalopram 20 MG tablet Commonly known as: CELEXA Take 20 mg by mouth daily.   clotrimazole 1 % cream Commonly known as: LOTRIMIN Apply 1 Application topically 2 (two) times daily as needed (fungal).   CoQ10 200 MG Caps Take 200 mg by mouth daily.   ketoconazole  2 % cream Commonly known as: NIZORAL  Apply 1 Application topically 2 (two) times daily. Bid to aa rash under breast prn flares   KP Fish Oil 1200 MG Caps Take 1,200 mg by mouth daily.   levocetirizine 5 MG tablet Commonly known as: Xyzal  Take 1 tablet (5 mg total) by mouth every evening.   lipase/protease/amylase 63999 UNITS Cpep capsule Commonly known as: Creon  Take 2 capsule with the first bite of each meal and 1 capsule with the first bite of each snack.   lisinopril -hydrochlorothiazide  20-12.5 MG tablet Commonly known as: ZESTORETIC  Take 1.5 tablets by mouth daily.   lovastatin 40 MG tablet Commonly known as: MEVACOR Take 1.5 tablets by mouth at bedtime.   Magnesium Oxide -Mg Supplement 500 MG Tabs Take 500 mg by mouth daily with breakfast.   metroNIDAZOLE  0.75 % cream Commonly known as: METROCREAM  Apply to affected areas once to twice daily for rosacea. What changed:  how much to take how to take this when to take this reasons to take this   mupirocin  ointment 2 % Commonly known as: BACTROBAN  Apply 1 Application topically 2 (two) times daily. bid to aa sores nose prn flares   nystatin  cream Commonly known as: MYCOSTATIN  Apply 1 Application topically 2  (two) times daily. What changed:  when to take this reasons to take this   Olopatadine HCl 0.2 % Soln Apply 1 drop to eye at bedtime.   omeprazole  40 MG capsule Commonly known as: PRILOSEC Take 40 mg by mouth at bedtime.   raloxifene 60 MG tablet Commonly known as: EVISTA Take 60 mg by mouth daily.   Refresh Tears 0.5 % Soln Generic drug: carboxymethylcellulose Place 1-2 drops into both eyes daily as needed (itching).   Vitamin D3 25 MCG (1000 UT) Caps Take 1,000 Units by mouth daily.   vitamin E 180 MG (400 UNITS) capsule Take 400 Units by mouth daily.        Follow-up Information     Alla Amis, MD. Schedule an appointment as soon as possible for a visit in 1 week(s).   Specialty: Family Medicine Contact information: [address] [facility] [place] [address] [phone]                Allergies[1]  Consultations: Cardiology   Procedures/Studies:  DG Chest 2 View Result Date: 10/27/2024 EXAM: 2 VIEW(S) XRAY OF THE CHEST 10/27/2024 06:59:00 AM COMPARISON: 10/24/2024 CLINICAL HISTORY: 74 year old female. Postsurgical cardiac pacemaker in situ. FINDINGS: LINES, TUBES AND DEVICES: Left chest dual-chamber pacemaker with leads in right atrium and right ventricle. Intact leads. LUNGS AND PLEURA: No focal pulmonary opacity. No pleural effusion. No pneumothorax. HEART AND MEDIASTINUM: No acute abnormality of the cardiac and mediastinal silhouettes. BONES AND SOFT TISSUES: Cervical spine fusion hardware noted. No acute osseous abnormality. IMPRESSION: 1. New left chest cardiac pacemaker. No acute cardiopulmonary abnormality. Electronically signed by: Helayne Hurst MD 10/27/2024 07:09 AM EST RP Workstation: [id number]   EP PPM/ICD IMPLANT Result Date: 10/26/2024 Table formatting from the original result was not included.  SURGEON:  Eulas Furbish, MD    PREPROCEDURE DIAGNOSES:  1. Irreversible symptomatic bradycardia due to complete heart  block    POSTPROCEDURE DIAGNOSES:  1. Irreversible symptomatic bradycardia due to complete heart block    PROCEDURES:   1. Dual chamber permanent pacemaker implantation    INTRODUCTION: SHAMRA BRADEEN is a 74 y.o. patient who presents to the EP lab for dual chamber permanent pacemaker implantation.    DESCRIPTION OF PROCEDURE:  Informed written consent was obtained and the patient was brought to the electrophysiology lab in the fasting state. The patient was adequately sedated with intravenous Versed , and fentanyl  as outlined in the nursing report.  The patient's left chest was prepped and draped in the usual sterile fashion by the EP lab staff.  The skin overlying the left deltopectoral region was infiltrated with lidocaine  for local analgesia.  An incision was created over the left deltopectoral region.  A left subcutaneous pocket was fashioned using a combination of sharp and blunt dissection immediately superficial to the pectoral fascia.  Electrocautery was used to assure hemostasis. Lead Placement: The left axillary vein was cannulated using modified seldinger techniques.  Through the left axillary vein, an RV pace/sense lead was advanced to the RV mid septum and advanced deeply, pausing intermittently to assess lead parameters. The lead was secured to the pectoral fascia. Next, the right atrial lead was advanced to the RA appendage. It was secured to the pectoral fascia.  Lead parameters are detailed below.  RA RV Model 1231 1231 Serial # [id number] Amplitude 3.5 mV 5.0 mV Threshold 0.5 V @ 0.5 S 1.0 V @ 0.5 S Impedence 490 ohms 760 ohms The pocket was irrigated with copious gentamicin  solution.  The leads were then  connected to a pulse generator (model Abbott Assurity MRI, serial [id number]). The pocket was closed in layers of absorbable suture. EBL < 10mL. Steri-strips and a sterile dressing were applied.  During this procedure the patient is administered Versed  and Fentanyl  by a trained provider under  my direct supervision to achieve and maintain moderate conscious sedation.  The patient's heart rate, blood pressure, and oxygen saturation are monitored continuously during the procedure. The period of conscious sedation is 43 minutes, of which I was present face-to-face 100% of this time.    CONCLUSIONS:  1. Successful dual chamber permanent pacemaker implantation  3.  No early apparent complications. Eulas Furbish, MD Cardiac Electrophysiology   ECHOCARDIOGRAM COMPLETE Result Date: 10/26/2024    ECHOCARDIOGRAM REPORT   Patient Name:   Jane Riley Date of Exam: 10/26/2024 Medical Rec #:  [id number]    Height:       64.0 in Accession #:    [id number]   Weight:  154.0 lb Date of Birth:  25-Jul-1951    BSA:          1.751 m Patient Age:    73 years     BP:           148/47 mmHg Patient Gender: F            HR:           43 bpm. Exam Location:  Inpatient Procedure: 2D Echo, Cardiac Doppler and Color Doppler (Both Spectral and Color            Flow Doppler were utilized during procedure). Indications:    Abnormal ECG  History:        Patient has prior history of Echocardiogram examinations and                 Patient has no prior history of Echocardiogram examinations.                 Arrythmias:Bradycardia; Risk Factors:Hypertension and                 Dyslipidemia.  Sonographer:    Sherlean Dubin Referring Phys: [id number] RENEE LYNN URSUY  Sonographer Comments: Image acquisition challenging due to respiratory motion. IMPRESSIONS  1. Left ventricular ejection fraction, by estimation, is 60 to 65%. The left ventricle has normal function. Left ventricular endocardial border not optimally defined to evaluate regional wall motion. There is mild concentric left ventricular hypertrophy. Left ventricular diastolic parameters were normal.  2. Right ventricular systolic function is normal. The right ventricular size is normal.  3. A small pericardial effusion is present. The pericardial effusion is anterior to the right  ventricle. There is no evidence of cardiac tamponade.  4. The mitral valve is normal in structure. Trivial mitral valve regurgitation. At risk for mitral stenosis mitral stenosis. The mean mitral valve gradient is 4.0 mmHg.  5. The aortic valve is normal in structure. Aortic valve regurgitation is mild. Mild aortic valve stenosis. Aortic valve area, by VTI measures 1.65 cm. Aortic valve mean gradient measures 12.0 mmHg. Aortic valve Vmax measures 2.58 m/s. FINDINGS  Left Ventricle: Left ventricular ejection fraction, by estimation, is 60 to 65%. The left ventricle has normal function. Left ventricular endocardial border not optimally defined to evaluate regional wall motion. The left ventricular internal cavity size was normal in size. There is mild concentric left ventricular hypertrophy. Left ventricular diastolic parameters were normal. Right Ventricle: The right ventricular size is normal. No increase in right ventricular wall thickness. Right ventricular systolic function is normal. Left Atrium: Left atrial size was normal in size. Right Atrium: Right atrial size was normal in size. Pericardium: A small pericardial effusion is present. The pericardial effusion is anterior to the right ventricle. There is no evidence of cardiac tamponade. Mitral Valve: The mitral valve is normal in structure. Trivial mitral valve regurgitation. At risk for mitral stenosis mitral valve stenosis. MV peak gradient, 8.0 mmHg. The mean mitral valve gradient is 4.0 mmHg. Tricuspid Valve: The tricuspid valve is grossly normal. Tricuspid valve regurgitation is trivial. Aortic Valve: The aortic valve is normal in structure. Aortic valve regurgitation is mild. Mild aortic stenosis is present. Aortic valve mean gradient measures 12.0 mmHg. Aortic valve peak gradient measures 26.7 mmHg. Aortic valve area, by VTI measures 1.65 cm. Pulmonic Valve: The pulmonic valve was not well visualized. Pulmonic valve regurgitation is not visualized. No  evidence of pulmonic stenosis. IAS/Shunts: No atrial level shunt detected by color flow Doppler.  LEFT VENTRICLE PLAX 2D LVIDd:         3.80 cm   Diastology LVIDs:         2.70 cm   LV e' medial:    8.41 cm/s LV PW:         1.10 cm   LV E/e' medial:  5.7 LV IVS:        1.20 cm   LV e' lateral:   11.70 cm/s LVOT diam:     1.90 cm   LV E/e' lateral: 4.1 LV SV:         87 LV SV Index:   50 LVOT Area:     2.84 cm  RIGHT VENTRICLE             IVC RV S prime:     13.10 cm/s  IVC diam: 1.50 cm TAPSE (M-mode): 1.8 cm LEFT ATRIUM           Index        RIGHT ATRIUM           Index LA diam:      3.00 cm 1.71 cm/m   RA Area:     14.60 cm LA Vol (A2C): 53.4 ml 30.50 ml/m  RA Volume:   33.50 ml  19.14 ml/m LA Vol (A4C): 47.9 ml 27.36 ml/m  AORTIC VALVE AV Area (Vmax):    1.27 cm AV Area (Vmean):   1.32 cm AV Area (VTI):     1.65 cm AV Vmax:           258.50 cm/s AV Vmean:          157.000 cm/s AV VTI:            0.528 m AV Peak Grad:      26.7 mmHg AV Mean Grad:      12.0 mmHg LVOT Vmax:         116.00 cm/s LVOT Vmean:        73.300 cm/s LVOT VTI:          0.308 m LVOT/AV VTI ratio: 0.58 MITRAL VALVE                TRICUSPID VALVE MV Area (PHT): 3.65 cm     TR Peak grad:   14.3 mmHg MV Peak grad:  8.0 mmHg     TR Vmax:        189.00 cm/s MV Mean grad:  4.0 mmHg MV Vmax:       1.41 m/s     SHUNTS MV Vmean:      87.5 cm/s    Systemic VTI:  0.31 m MV Decel Time: 208 msec     Systemic Diam: 1.90 cm MR Peak grad: 44.6 mmHg MR Vmax:      334.00 cm/s MV E velocity: 48.30 cm/s MV A velocity: 909.00 cm/s MV E/A ratio:  0.05 Kardie Tobb DO Electronically signed by Dub Huntsman DO Signature Date/Time: 10/26/2024/3:15:16 PM    Final    DG Chest Portable 1 View Result Date: 10/24/2024 EXAM: 1 VIEW XRAY OF THE CHEST 10/24/2024 09:30:00 PM COMPARISON: 03/11/2018 CLINICAL HISTORY: complete heart block FINDINGS: LUNGS AND PLEURA: No focal pulmonary opacity. No pleural effusion. No pneumothorax. HEART AND MEDIASTINUM: No acute  abnormality of the cardiac and mediastinal silhouettes. BONES AND SOFT TISSUES: No acute osseous abnormality. IMPRESSION: 1. No acute process. Electronically signed by: Franky Crease MD 10/24/2024 09:32 PM EST RP Workstation: [id number]      Subjective: Patient seen and  examined at bedside today.  Hemodynamically stable.  Overall comfortable.  Heart rate well-controlled.  Underwent pacemaker placement yesterday.  Pain well-controlled ,medically stable for discharge  Discharge Exam: Vitals:   10/27/24 0358 10/27/24 0749  BP: (!) 157/74 (!) 159/73  Pulse: 77   Resp: 18 18  Temp: 98.1 F (36.7 C) 98 F (36.7 C)  SpO2: 93% 95%   Vitals:   10/26/24 2130 10/27/24 0052 10/27/24 0358 10/27/24 0749  BP: (!) 150/71 (!) 156/72 (!) 157/74 (!) 159/73  Pulse: 87 84 77   Resp:   18 18  Temp:  97.7 F (36.5 C) 98.1 F (36.7 C) 98 F (36.7 C)  TempSrc:  Oral Oral Oral  SpO2: 95% 97% 93% 95%  Weight:      Height:        General: Pt is alert, awake, not in acute distress Cardiovascular: RRR, S1/S2 +, no rubs, no gallops Respiratory: CTA bilaterally, no wheezing, no rhonchi Abdominal: Soft, NT, ND, bowel sounds + Extremities: no edema, no cyanosis    The results of significant diagnostics from this hospitalization (including imaging, microbiology, ancillary and laboratory) are listed below for reference.     Microbiology: Recent Results (from the past 240 hours)  Resp panel by RT-PCR (RSV, Flu A&B, Covid) Anterior Nasal Swab     Status: None   Collection Time: 10/24/24  8:53 PM   Specimen: Anterior Nasal Swab  Result Value Ref Range Status   SARS Coronavirus 2 by RT PCR NEGATIVE NEGATIVE Final    Comment: (NOTE) SARS-CoV-2 target nucleic acids are NOT DETECTED.  The SARS-CoV-2 RNA is generally detectable in upper respiratory specimens during the acute phase of infection. The lowest concentration of SARS-CoV-2 viral copies this assay can detect is 138 copies/mL. A negative result  does not preclude SARS-Cov-2 infection and should not be used as the sole basis for treatment or other patient management decisions. A negative result may occur with  improper specimen collection/handling, submission of specimen other than nasopharyngeal swab, presence of viral mutation(s) within the areas targeted by this assay, and inadequate number of viral copies(<138 copies/mL). A negative result must be combined with clinical observations, patient history, and epidemiological information. The expected result is Negative.  Fact Sheet for Patients:  bloggercourse.com  Fact Sheet for Healthcare Providers:  seriousbroker.it  This test is no t yet approved or cleared by the [place]  FDA and  has been authorized for detection and/or diagnosis of SARS-CoV-2 by FDA under an Emergency Use Authorization (EUA). This EUA will remain  in effect (meaning this test can be used) for the duration of the COVID-19 declaration under Section 564(b)(1) of the Act, 21 U.S.C.section 360bbb-3(b)(1), unless the authorization is terminated  or revoked sooner.       Influenza A by PCR NEGATIVE NEGATIVE Final   Influenza B by PCR NEGATIVE NEGATIVE Final    Comment: (NOTE) The Xpert Xpress SARS-CoV-2/FLU/RSV plus assay is intended as an aid in the diagnosis of influenza from Nasopharyngeal swab specimens and should not be used as a sole basis for treatment. Nasal washings and aspirates are unacceptable for Xpert Xpress SARS-CoV-2/FLU/RSV testing.  Fact Sheet for Patients: bloggercourse.com  Fact Sheet for Healthcare Providers: seriousbroker.it  This test is not yet approved or cleared by the [place]  FDA and has been authorized for detection and/or diagnosis of SARS-CoV-2 by FDA under an Emergency Use Authorization (EUA). This EUA will remain in effect (meaning this test can be used) for  the duration of  the COVID-19 declaration under Section 564(b)(1) of the Act, 21 U.S.C. section 360bbb-3(b)(1), unless the authorization is terminated or revoked.     Resp Syncytial Virus by PCR NEGATIVE NEGATIVE Final    Comment: (NOTE) Fact Sheet for Patients: bloggercourse.com  Fact Sheet for Healthcare Providers: seriousbroker.it  This test is not yet approved or cleared by the [place]  FDA and has been authorized for detection and/or diagnosis of SARS-CoV-2 by FDA under an Emergency Use Authorization (EUA). This EUA will remain in effect (meaning this test can be used) for the duration of the COVID-19 declaration under Section 564(b)(1) of the Act, 21 U.S.C. section 360bbb-3(b)(1), unless the authorization is terminated or revoked.  Performed at [facility], [address] [place], [place] [address]   Surgical PCR screen     Status: None   Collection Time: 10/26/24  4:37 AM   Specimen: Nasal Mucosa; Nasal Swab  Result Value Ref Range Status   MRSA, PCR NEGATIVE NEGATIVE Final   Staphylococcus aureus NEGATIVE NEGATIVE Final    Comment: (NOTE) The Xpert SA Assay (FDA approved for NASAL specimens in patients 11 years of age and older), is one component of a comprehensive surveillance program. It is not intended to diagnose infection nor to guide or monitor treatment. Performed at [facility], [address], [place], [place] [address]      Labs: BNP (last 3 results) No results for input(s): BNP in the last 8760 hours. Basic Metabolic Panel: Recent Labs  Lab 10/24/24 2055 10/26/24 0440 10/27/24 0350  NA 139 140 141  K 3.4* 3.6 3.7  CL 100 103 105  CO2 23 23 21*  GLUCOSE 91 118* 108*  BUN 14 16 11   CREATININE 0.90 0.74 0.76  CALCIUM 9.7 9.8 8.8*  MG 2.1  --   --    Liver Function Tests: Recent Labs  Lab 10/24/24 2055 10/26/24 0440 10/27/24 0350  AST 176* 51* 35  ALT 157* 88*  60*  ALKPHOS 59 49 47  BILITOT 0.2 0.4 0.5  PROT 7.2 6.2* 5.8*  ALBUMIN 5.0 4.2 4.0   No results for input(s): LIPASE, AMYLASE in the last 168 hours. No results for input(s): AMMONIA in the last 168 hours. CBC: Recent Labs  Lab 10/24/24 2055 10/26/24 0440  WBC 4.9 6.5  NEUTROABS 2.8  --   HGB 14.0 12.9  HCT 40.9 38.8  MCV 91.5 92.6  PLT 210 192   Cardiac Enzymes: No results for input(s): CKTOTAL, CKMB, CKMBINDEX, TROPONINI in the last 168 hours. BNP: Invalid input(s): POCBNP CBG: No results for input(s): GLUCAP in the last 168 hours. D-Dimer No results for input(s): DDIMER in the last 72 hours. Hgb A1c No results for input(s): HGBA1C in the last 72 hours. Lipid Profile No results for input(s): CHOL, HDL, LDLCALC, TRIG, CHOLHDL, LDLDIRECT in the last 72 hours. Thyroid function studies Recent Labs    10/24/24 2055  TSH 9.430*   Anemia work up No results for input(s): VITAMINB12, FOLATE, FERRITIN, TIBC, IRON, RETICCTPCT in the last 72 hours. Urinalysis    Component Value Date/Time   BILIRUBINUR neg 07/16/2017 0836   PROTEINUR neg 07/16/2017 0836   UROBILINOGEN 1.0 07/16/2017 0836   NITRITE neg 07/16/2017 0836   LEUKOCYTESUR Trace (A) 07/16/2017 0836   Sepsis Labs Recent Labs  Lab 10/24/24 2055 10/26/24 0440  WBC 4.9 6.5   Microbiology Recent Results (from the past 240 hours)  Resp panel by RT-PCR (RSV, Flu A&B, Covid) Anterior Nasal Swab  Status: None   Collection Time: 10/24/24  8:53 PM   Specimen: Anterior Nasal Swab  Result Value Ref Range Status   SARS Coronavirus 2 by RT PCR NEGATIVE NEGATIVE Final    Comment: (NOTE) SARS-CoV-2 target nucleic acids are NOT DETECTED.  The SARS-CoV-2 RNA is generally detectable in upper respiratory specimens during the acute phase of infection. The lowest concentration of SARS-CoV-2 viral copies this assay can detect is 138 copies/mL. A negative result does not  preclude SARS-Cov-2 infection and should not be used as the sole basis for treatment or other patient management decisions. A negative result may occur with  improper specimen collection/handling, submission of specimen other than nasopharyngeal swab, presence of viral mutation(s) within the areas targeted by this assay, and inadequate number of viral copies(<138 copies/mL). A negative result must be combined with clinical observations, patient history, and epidemiological information. The expected result is Negative.  Fact Sheet for Patients:  bloggercourse.com  Fact Sheet for Healthcare Providers:  seriousbroker.it  This test is no t yet approved or cleared by the [place]  FDA and  has been authorized for detection and/or diagnosis of SARS-CoV-2 by FDA under an Emergency Use Authorization (EUA). This EUA will remain  in effect (meaning this test can be used) for the duration of the COVID-19 declaration under Section 564(b)(1) of the Act, 21 U.S.C.section 360bbb-3(b)(1), unless the authorization is terminated  or revoked sooner.       Influenza A by PCR NEGATIVE NEGATIVE Final   Influenza B by PCR NEGATIVE NEGATIVE Final    Comment: (NOTE) The Xpert Xpress SARS-CoV-2/FLU/RSV plus assay is intended as an aid in the diagnosis of influenza from Nasopharyngeal swab specimens and should not be used as a sole basis for treatment. Nasal washings and aspirates are unacceptable for Xpert Xpress SARS-CoV-2/FLU/RSV testing.  Fact Sheet for Patients: bloggercourse.com  Fact Sheet for Healthcare Providers: seriousbroker.it  This test is not yet approved or cleared by the [place]  FDA and has been authorized for detection and/or diagnosis of SARS-CoV-2 by FDA under an Emergency Use Authorization (EUA). This EUA will remain in effect (meaning this test can be used) for the  duration of the COVID-19 declaration under Section 564(b)(1) of the Act, 21 U.S.C. section 360bbb-3(b)(1), unless the authorization is terminated or revoked.     Resp Syncytial Virus by PCR NEGATIVE NEGATIVE Final    Comment: (NOTE) Fact Sheet for Patients: bloggercourse.com  Fact Sheet for Healthcare Providers: seriousbroker.it  This test is not yet approved or cleared by the [place]  FDA and has been authorized for detection and/or diagnosis of SARS-CoV-2 by FDA under an Emergency Use Authorization (EUA). This EUA will remain in effect (meaning this test can be used) for the duration of the COVID-19 declaration under Section 564(b)(1) of the Act, 21 U.S.C. section 360bbb-3(b)(1), unless the authorization is terminated or revoked.  Performed at [facility], [address], [place], [place] [address]   Surgical PCR screen     Status: None   Collection Time: 10/26/24  4:37 AM   Specimen: Nasal Mucosa; Nasal Swab  Result Value Ref Range Status   MRSA, PCR NEGATIVE NEGATIVE Final   Staphylococcus aureus NEGATIVE NEGATIVE Final    Comment: (NOTE) The Xpert SA Assay (FDA approved for NASAL specimens in patients 48 years of age and older), is one component of a comprehensive surveillance program. It is not intended to diagnose infection nor to guide or monitor treatment. Performed at [facility], [address] [place], [place] [address]     Please note: You were cared for by a hospitalist during your hospital stay. Once you are discharged, your primary care physician will handle any further medical issues. Please note that NO REFILLS for any discharge medications will be authorized once you are discharged, as it is imperative that you return to your primary care physician (or establish a relationship with a primary care physician if you do not have one) for your post hospital discharge needs so that they can  reassess your need for medications and monitor your lab values.    Time coordinating discharge: 40 minutes  SIGNED:   Ivonne Mustache, MD  [facility] 10/27/2024, 9:44 AM Pager [phone]  If 7PM-7AM, please contact night-coverage [email or web address] Password [email or web address]    [1]  Allergies Allergen Reactions   Codeine Nausea And Vomiting and Nausea Only    itching   Doxycycline      Burning in throat/ GI irritation per patient    Adhesive [Tape] Other (See Comments)    Skin irritation.  Tegaderm OK Paper tape is okay   Amlodipine  Swelling    Foot swelling   Amoxicillin Rash    Caused skin blister on back of left leg Has patient had a PCN reaction causing immediate rash, facial/tongue/throat swelling, SOB or lightheadedness with hypotension: No Has patient had a PCN reaction causing severe rash involving mucus membranes or skin necrosis: No Has patient had a PCN reaction that required hospitalization: No Has patient had a PCN reaction occurring within the last 10 years: No If all of the above answers are NO, then may proceed with Cephalosporin use.    Nsaids Other (See Comments)    GI upset   Other Itching    Narcotic pain medications- causes itching Other reaction(s): Other (See Comments) Skin irritation.  Tegaderm OK Paper tape is okay   Oxycodone Itching    Most pain medications cause itching per patient   Penicillins Itching and Rash   Pravastatin Other (See Comments)    Body aches

## 2024-10-27 NOTE — Progress Notes (Signed)
 Discharge   Patient expressed verbal understanding of discharge POC.   Patient given time to ask any questions.  Additional education included in AVS.  Alert oriented in good spirits.   Tele and PIV removed by primary RN . Pressure dressings intact.  All personal belongings at bedtime. Discharged to [facility].

## 2024-10-27 NOTE — Discharge Instructions (Addendum)
 After Your Pacemaker   You have a Abbott Pacemaker  If you have a Medtronic or Biotronik device, plug in your home monitor once you get home, and no manual interaction is required.   If you have an Abbott or [employer] device, plug your home monitor once you get home, sit near the device, and press the large activation button. Sit nearby until the process is complete, usually notated by lights on the monitor.   If you were set up for monitoring using an app on your phone, make sure the app remains open in the background and the Bluetooth remains on.  ACTIVITY Do not lift your arm above shoulder height for 1 week after your procedure. After 7 days, you may progress as below.  You should remove your sling 24 hours after your procedure, unless otherwise instructed by your provider.     Thursday November 03, 2024  Friday November 04, 2024 Saturday November 05, 2024 Sunday November 06, 2024   Do not lift, push, pull, or carry anything over 10 pounds with the affected arm until 6 weeks (Thursday December 08, 2024 ) after your procedure.   You may drive AFTER your wound check, unless you have been told otherwise by your provider.   Ask your healthcare provider when you can go back to work   INCISION/Dressing If you are on a blood thinner such as Coumadin, Xarelto, Eliquis, Plavix, or Pradaxa please confirm with your provider when this should be resumed.   If large square, outer bandage is left in place, this can be removed after 24 hours from your procedure. Do not remove steri-strips or glue as below.   If a PRESSURE DRESSING (a bulky dressing that usually goes up over your shoulder) was applied or left in place, please follow instructions given by your provider on when to return to have this removed.   Monitor your Pacemaker site for redness, swelling, and drainage. Call the device clinic at [phone] if you experience these symptoms or fever/chills.  If your incision is sealed with  Steri-strips or staples, you may shower 7 days after your procedure or when told by your provider. Do not remove the steri-strips or let the shower hit directly on your site. You may wash around your site with soap and water.    If you were discharged in a sling, please do not wear this during the day more than 48 hours after your surgery unless otherwise instructed. This may increase the risk of stiffness and soreness in your shoulder.   Avoid lotions, ointments, or perfumes over your incision until it is well-healed.  You may use a hot tub or a pool AFTER your wound check appointment if the incision is completely closed.  Pacemaker Alerts:  Some alerts are vibratory and others beep. These are NOT emergencies. Please call our office to let us  know. If this occurs at night or on weekends, it can wait until the next business day. Send a remote transmission.  If your device is capable of reading fluid status (for heart failure), you will be offered monthly monitoring to review this with you.   DEVICE MANAGEMENT Remote monitoring is used to monitor your pacemaker from home. This monitoring is scheduled every 91 days by our office. It allows us  to keep an eye on the functioning of your device to ensure it is working properly. You will routinely see your Electrophysiologist annually (more often if necessary).  This will appear as a REMOTE check on your  MyChart schedule. These are automatic and there is nothing for you to manually do unless otherwise instructed.  You should receive your ID card for your new device in 4-8 weeks. Keep this card with you at all times once received. Consider wearing a medical alert bracelet or necklace.  Your Pacemaker may be MRI compatible. This will be discussed at your next office visit/wound check.  You should avoid contact with strong electric or magnetic fields.   Do not use amateur (ham) radio equipment or electric (arc) welding torches. MP3 player headphones with  magnets should not be used. Some devices are safe to use if held at least 12 inches (30 cm) from your Pacemaker. These include power tools, lawn mowers, and speakers. If you are unsure if something is safe to use, ask your health care provider.  When using your cell phone, hold it to the ear that is on the opposite side from the Pacemaker. Do not leave your cell phone in a pocket over the Pacemaker.  You may safely use electric blankets, heating pads, computers, and microwave ovens.  Call the office right away if: You have chest pain. You feel more short of breath than you have felt before. You feel more light-headed than you have felt before. Your incision starts to open up.  This information is not intended to replace advice given to you by your health care provider. Make sure you discuss any questions you have with your health care provider.

## 2024-10-27 NOTE — Progress Notes (Signed)
 Pt reporting 8/10 pain this morning at pacemaker implant site. Night shift RN notified on call MD as well as on coming Adhikari, MD. Orders placed for additional pain medication. Charlies Arthur, PA-C with EP arrived to unit. Verbal orders from [place], PA-C to give tylenol  first this AM, other interventions available if needed later on. Jillian, MD aware. Will continue to monitor pt and their pain.

## 2024-10-27 NOTE — TOC CM/SW Note (Signed)
 Transition of Care [facility]) - Inpatient Brief Assessment   Patient Details  Name: Jane Riley MRN: [id number] Date of Birth: 09/09/1951  [facility] CM/SW Contact:    Sudie Erminio Deems, RN Phone Number: 10/27/2024, 10:09 AM   Clinical Narrative: Patient post pacemaker implant. PTA patient was from home with support of spouse. Spouse is at the bedside and will transport home via private vehicle. Patient has insurance and PCP. No home needs identified at this time.    Transition of Care Asessment: Insurance and Status: Insurance coverage has been reviewed Patient has primary care physician: Yes Home environment has been reviewed: reviewed Prior level of function:: indpendent Prior/Current Home Services: No current home services Social Drivers of Health Review: SDOH reviewed no interventions necessary Readmission risk has been reviewed: Yes Transition of care needs: no transition of care needs at this time

## 2024-10-27 NOTE — Plan of Care (Signed)
   Problem: Activity: Goal: Risk for activity intolerance will decrease Outcome: Progressing   Problem: Pain Managment: Goal: General experience of comfort will improve and/or be controlled Outcome: Progressing   Problem: Safety: Goal: Ability to remain free from injury will improve Outcome: Progressing

## 2024-11-08 ENCOUNTER — Ambulatory Visit

## 2024-11-08 DIAGNOSIS — I442 Atrioventricular block, complete: Secondary | ICD-10-CM | POA: Diagnosis not present

## 2024-11-08 NOTE — Patient Instructions (Signed)
" ° °  After Your Pacemaker   Monitor your pacemaker site for redness, swelling, and drainage. Call the device clinic at [phone] if you experience these symptoms or fever/chills.  Your incision was closed with Steri-strips or staples:  You may shower 7 days after your procedure and wash your incision with soap and water . Avoid lotions, ointments, or perfumes over your incision until it is well-healed.  You may use a hot tub or a pool after your wound check appointment if the incision is completely closed.  Do not lift, push or pull greater than 10 pounds with the affected arm until 6 weeks after your procedure. UNTIL AFTER MARCH 4TH. There are no other restrictions in arm movement after your wound check appointment.  You may drive, unless driving has been restricted by your healthcare providers.  Remote monitoring is used to monitor your pacemaker from home. This monitoring is scheduled every 91 days by our office. It allows us  to keep an eye on the functioning of your device to ensure it is working properly. You will routinely see your Electrophysiologist annually (more often if necessary).  "

## 2024-11-09 LAB — CUP PACEART INCLINIC DEVICE CHECK
Battery Remaining Longevity: 124 mo
Battery Voltage: 3.07 V
Brady Statistic RA Percent Paced: 0.28 %
Brady Statistic RV Percent Paced: 99.86 %
Date Time Interrogation Session: 20260203173100
Implantable Lead Connection Status: 753985
Implantable Lead Connection Status: 753985
Implantable Lead Implant Date: 20260121
Implantable Lead Implant Date: 20260121
Implantable Lead Location: 753859
Implantable Lead Location: 753860
Implantable Pulse Generator Implant Date: 20260121
Lead Channel Impedance Value: 475 Ohm
Lead Channel Impedance Value: 512.5 Ohm
Lead Channel Pacing Threshold Amplitude: 0.625 V
Lead Channel Pacing Threshold Amplitude: 0.875 V
Lead Channel Pacing Threshold Pulse Width: 0.5 ms
Lead Channel Pacing Threshold Pulse Width: 0.5 ms
Lead Channel Sensing Intrinsic Amplitude: 3.2 mV
Lead Channel Setting Pacing Amplitude: 1.125
Lead Channel Setting Pacing Amplitude: 1.625
Lead Channel Setting Pacing Pulse Width: 0.5 ms
Lead Channel Setting Sensing Sensitivity: 4 mV
Pulse Gen Model: 2272
Pulse Gen Serial Number: 8335014

## 2024-11-09 NOTE — Progress Notes (Signed)
 Normal dual chamber pacemaker wound check. 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 Wound well healed. Routine testing performed. Thresholds, sensing, and impedance consistent with implant measurements and at 3.5V safety margin/auto capture until 3 month visit. No episodes. Reviewed arm restrictions to continue for 6 weeks total post op.  Pt enrolled in remote follow-up.  NOTE: patient is completely dependent and VERY SYMPTOMATIC WITH SENSITIVITY TESTING. Recommend lying back if running testing and not to drop rate below 40.  She could pass out.

## 2024-11-15 ENCOUNTER — Ambulatory Visit: Admitting: Internal Medicine

## 2024-12-07 ENCOUNTER — Ambulatory Visit

## 2025-01-25 ENCOUNTER — Ambulatory Visit: Admitting: Cardiology

## 2025-03-08 ENCOUNTER — Ambulatory Visit

## 2025-06-07 ENCOUNTER — Ambulatory Visit

## 2025-06-13 ENCOUNTER — Encounter: Admitting: Dermatology

## 2025-09-06 ENCOUNTER — Ambulatory Visit

## 2025-12-06 ENCOUNTER — Ambulatory Visit
